# Patient Record
Sex: Female | Born: 1985 | Race: White | Hispanic: No | Smoking: Former smoker
Health system: Southern US, Community
[De-identification: ages and names within clinical notes are randomized; demographics above are authoritative.]

## PROBLEM LIST (undated history)

## (undated) DIAGNOSIS — F419 Anxiety disorder, unspecified: Secondary | ICD-10-CM

## (undated) DIAGNOSIS — E119 Type 2 diabetes mellitus without complications: Secondary | ICD-10-CM

## (undated) DIAGNOSIS — K219 Gastro-esophageal reflux disease without esophagitis: Secondary | ICD-10-CM

## (undated) DIAGNOSIS — I509 Heart failure, unspecified: Secondary | ICD-10-CM

## (undated) DIAGNOSIS — E785 Hyperlipidemia, unspecified: Secondary | ICD-10-CM

## (undated) DIAGNOSIS — I272 Pulmonary hypertension, unspecified: Secondary | ICD-10-CM

## (undated) DIAGNOSIS — M419 Scoliosis, unspecified: Secondary | ICD-10-CM

## (undated) DIAGNOSIS — Q213 Tetralogy of Fallot: Secondary | ICD-10-CM

## (undated) DIAGNOSIS — M549 Dorsalgia, unspecified: Secondary | ICD-10-CM

## (undated) HISTORY — DX: Tetralogy of Fallot: Q21.3

## (undated) HISTORY — DX: Type 2 diabetes mellitus without complications: E11.9

## (undated) HISTORY — PX: BACK SURGERY: SHX140

## (undated) HISTORY — DX: Hyperlipidemia, unspecified: E78.5

## (undated) HISTORY — PX: TETRALOGY OF FALLOT REPAIR: SHX796

## (undated) HISTORY — PX: BLALOCK PROCEDURE: SHX1242

---
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
## 2003-07-15 ENCOUNTER — Encounter: Payer: Self-pay | Admitting: *Deleted

## 2003-07-15 ENCOUNTER — Ambulatory Visit (HOSPITAL_COMMUNITY): Admission: RE | Admit: 2003-07-15 | Discharge: 2003-07-15 | Payer: Self-pay | Admitting: *Deleted

## 2003-07-15 ENCOUNTER — Encounter: Admission: RE | Admit: 2003-07-15 | Discharge: 2003-07-15 | Payer: Self-pay | Admitting: *Deleted

## 2009-12-13 ENCOUNTER — Ambulatory Visit: Payer: Self-pay | Admitting: Family Medicine

## 2010-07-20 ENCOUNTER — Emergency Department (HOSPITAL_COMMUNITY): Admission: EM | Admit: 2010-07-20 | Discharge: 2010-07-20 | Payer: Self-pay | Admitting: Emergency Medicine

## 2010-07-20 ENCOUNTER — Other Ambulatory Visit: Payer: Self-pay | Admitting: Obstetrics & Gynecology

## 2010-07-23 ENCOUNTER — Ambulatory Visit: Payer: Self-pay | Admitting: Nurse Practitioner

## 2010-07-23 ENCOUNTER — Observation Stay (HOSPITAL_COMMUNITY): Admission: EM | Admit: 2010-07-23 | Discharge: 2010-07-24 | Payer: Self-pay | Admitting: Emergency Medicine

## 2010-07-24 ENCOUNTER — Encounter: Payer: Self-pay | Admitting: Internal Medicine

## 2010-08-01 ENCOUNTER — Ambulatory Visit: Payer: Self-pay | Admitting: Internal Medicine

## 2010-08-01 DIAGNOSIS — J45909 Unspecified asthma, uncomplicated: Secondary | ICD-10-CM | POA: Insufficient documentation

## 2010-08-01 DIAGNOSIS — M4120 Other idiopathic scoliosis, site unspecified: Secondary | ICD-10-CM | POA: Insufficient documentation

## 2010-08-01 DIAGNOSIS — R011 Cardiac murmur, unspecified: Secondary | ICD-10-CM

## 2010-08-01 DIAGNOSIS — F411 Generalized anxiety disorder: Secondary | ICD-10-CM | POA: Insufficient documentation

## 2010-08-01 DIAGNOSIS — K219 Gastro-esophageal reflux disease without esophagitis: Secondary | ICD-10-CM

## 2010-08-01 DIAGNOSIS — Z8774 Personal history of (corrected) congenital malformations of heart and circulatory system: Secondary | ICD-10-CM | POA: Insufficient documentation

## 2010-08-01 DIAGNOSIS — J309 Allergic rhinitis, unspecified: Secondary | ICD-10-CM | POA: Insufficient documentation

## 2010-08-23 ENCOUNTER — Ambulatory Visit: Payer: Self-pay | Admitting: Internal Medicine

## 2010-08-23 ENCOUNTER — Other Ambulatory Visit: Admission: RE | Admit: 2010-08-23 | Discharge: 2010-08-23 | Payer: Self-pay | Admitting: Internal Medicine

## 2010-08-23 LAB — CONVERTED CEMR LAB: Pap Smear: NEGATIVE

## 2010-09-02 ENCOUNTER — Encounter: Payer: Self-pay | Admitting: Internal Medicine

## 2010-11-22 ENCOUNTER — Telehealth: Payer: Self-pay | Admitting: Internal Medicine

## 2011-01-24 NOTE — Progress Notes (Signed)
Summary: RF-xanax  Phone Note Refill Request Message from:  Fax from Pharmacy on November 22, 2010 8:21 AM  Refills Requested: Medication #1:  XANAX 0.25 MG TABS 1 to 2 once daily as needed   Last Refilled: 10/24/2010 Last office visit 08/23/2010 please Advise refills  Initial call taken by: Ami Bullins CMA,  November 22, 2010 8:22 AM  Follow-up for Phone Call        ok x 3 will need f/u appt next 3 months before add'l renewals Follow-up by: Jacques Navy MD,  November 22, 2010 9:14 AM    Prescriptions: Prudy Feeler 0.25 MG TABS (ALPRAZOLAM) 1 to 2 once daily as needed  #60 x 2   Entered by:   Lamar Sprinkles, CMA   Authorized by:   Jacques Navy MD   Signed by:   Lamar Sprinkles, CMA on 11/22/2010   Method used:   Telephoned to ...       [facility]       [address]       [place], [place]  [address]       Ph: [phone]       Fax: [phone]   RxID:   [id number]

## 2011-01-24 NOTE — Assessment & Plan Note (Signed)
Summary: new / self pay, advised to bring $184, will pay what she can/...   Vital Signs:  Patient profile:   25 year old female Height:      60.5 inches Weight:      153 pounds BMI:     29.50 O2 Sat:      90 % on Room air Temp:     98.1 degrees F oral Pulse rate:   76 / minute BP sitting:   132 / 60  (left arm) Cuff size:   regular  Vitals Entered By: Bill Salinas CMA (August 01, 2010 2:41 PM)  O2 Flow:  Room air  Primary Care Kalina Morabito:  michael e. norins   History of Present Illness: Patient seen for hospital follow-up: She had one ED visit for anxiety. She then had an episode of Chest pain and had 24 hr observation - ruled out for MI and was told this was a panic attach. Reviewed hospital records: neg CT Angio, normal EKG, labs were normal including cardiac enzymes. she has done well taking the xanax on a as needed basis and this has treated her chest pain.   She is also here to establish for primary care.   Preventive Screening-Counseling & Management  Safety-Violence-Falls     Seat Belt Use: yes     Helmet Use: yes     Violence in the Home: no risk noted  Current Medications (verified): 1)  Xanax 0.25 Mg Tabs (Alprazolam) .Marland Kitchen.. 1 To 2 Once Daily As Needed 2)  Multiple Vitamin  Tabs (Multiple Vitamin) .Marland Kitchen.. 1 Tab Once Daily  Allergies (verified): No Known Drug Allergies  Past History:  Past Medical History: ALLERGIC RHINITIS CAUSE UNSPECIFIED (ICD-477.9) SCOLIOSIS, THORACOLUMBAR (ICD-737.30) TETRALOGY OF FALLOT, HX OF (ICD-V12.59) HEART MURMUR (ICD-785.2) GERD (ICD-530.81) Hx of ASTHMA (ICD-493.90)  Past Surgical History: Heart  shunt operation @ 3months old Tetraology of the Fallot at 26 years old (53) Heart Cath at 15 years Scoliosis surg (steel rods placed) 1997  Family History: Father - 1960: EtOH abuse, smoker Mother - 1955: DM, HTN, Uterine cancer, colon polyps M Aunt with Breast Cancer, Lung Cancer Neg - colon cancer; CAD/MI Family History of  Alcoholism/Addiction- parents Family History of Arthritis- grandparents Family History High cholesterol- parents/ grandparents  Social History: HSG, community college - a little bit Single, in a 4 year relationship no children Unemployed No histor of abuse. Seat Belt Use:  yes  Review of Systems       The patient complains of chest pain.  The patient denies anorexia, fever, weight loss, weight gain, vision loss, decreased hearing, hoarseness, syncope, dyspnea on exertion, peripheral edema, headaches, abdominal pain, severe indigestion/heartburn, incontinence, difficulty walking, unusual weight change, enlarged lymph nodes, and breast masses.    Physical Exam  General:  WNWD white female in no distress Head:  normocephalic and atraumatic.   Eyes:  vision grossly intact, pupils equal, pupils round, pupils reactive to light, corneas and lenses clear, no injection, no iris abnormalities, no optic disk abnormalities, and no retinal abnormalitiies.   Ears:  cerumen impaction right.  Nose:  no external deformity and no external erythema.   Mouth:  good dentition, no gingival abnormalities, and no dental plaque.   Neck:  supple, full ROM, no masses, no thyromegaly, and no carotid bruits.   Chest Wall:  scociolosis thoracolumbar with prominent right scapula. No CVAT Breasts:  deferred Lungs:  normal respiratory effort, normal breath sounds, no crackles, and no wheezes.   Heart:  normal  rate and regular rhythm.  complex murmurs: RSB harsh II/VI systolic with I/VI diastolic; LSB with III/VI harsh rumbling systolic murmur, II/VI diastolic murmur. No murmur at apex or left axillary Abdomen:  soft, non-tender, normal bowel sounds, no guarding, no rigidity, and no hepatomegaly.   Genitalia:  deferred Msk:  normal ROM, no joint tenderness, no joint swelling, and no joint warmth.   Pulses:  2+ radial and DP Extremities:  No clubbing, cyanosis, edema, or deformity noted with normal full range of motion  of all joints.   Neurologic:  alert & oriented X3, cranial nerves II-XII intact, strength normal in all extremities, gait normal, and DTRs symmetrical and normal.   Skin:  turgor normal, color normal, no suspicious lesions, and no ulcerations.   Cervical Nodes:  no anterior cervical adenopathy and no posterior cervical adenopathy.   Psych:  Oriented X3, memory intact for recent and remote, normally interactive, good eye contact, and moderately anxious.     Impression & Recommendations:  Problem # 1:  SCOLIOSIS, THORACOLUMBAR (ICD-737.30) Patient with chronic back pain due to scoliosis.  Plan - will best benefit from Physical Therapy consult for posture and exercise program - patient directed to [facility]  Problem # 2:  ASTHMA (ICD-493.90) Stable asthma with no wheezing or SOB on todays exam.   Problem # 3:  ANXIETY (ICD-300.00) Patient with chronic anxiety but has recently had what has been diagnosed as a panic attack with recent hospitalization for chest pain. she was provided Xanax at discharge. On questioning she admits to chronic low level anxiety.  Plan - fluoxetine 20mg  daily for management of chronic anxiety           Xanax to be used as needed for breakthrough anxiety.  Her updated medication list for this problem includes:    Xanax 0.25 Mg Tabs (Alprazolam) .Marland Kitchen... 1 to 2 once daily as needed    Fluoxetine Hcl 20 Mg Caps (Fluoxetine hcl) .Marland Kitchen... 1 by mouth qam for anxiety    Alprazolam 0.5 Mg Tabs (Alprazolam) .Marland Kitchen... 1 by mouth q4 as needed anxiety attack  Problem # 4:  Preventive Health Care (ICD-V70.0) Patient with no active problems except as above. She has never had a PAP smear. She did have recent labs while in hospital: see data scanned to chart: lipids normal, CBC, Bmet normal, A1C normal. Patient does have murmur of tetralogy repair but has not had a 2D echo for a long time - will consider this at a later date.  Patient is referred to [facility]  In summary - a very nice young woman who is established for continuity care. She will return soon for well woman exam.   Complete Medication List: 1)  Xanax 0.25 Mg Tabs (Alprazolam) .Marland Kitchen.. 1 to 2 once daily as needed 2)  Multiple Vitamin Tabs (Multiple vitamin) .Marland Kitchen.. 1 tab once daily 3)  Fluoxetine Hcl 20 Mg Caps (Fluoxetine hcl) .Marland Kitchen.. 1 by mouth qam for anxiety 4)  Alprazolam 0.5 Mg Tabs (Alprazolam) .Marland Kitchen.. 1 by mouth q4 as needed anxiety attack Prescriptions: ALPRAZOLAM 0.5 MG TABS (ALPRAZOLAM) 1 by mouth q4 as needed anxiety attack  #60 x 3   Entered and Authorized by:   Jacques Navy MD   Signed by:   Jacques Navy MD on 08/01/2010   Method used:   Print then Give to Patient   RxID:   [id number] FLUOXETINE HCL 20 MG CAPS (FLUOXETINE HCL) 1 by mouth qAM for anxiety  #30  x 12   Entered and Authorized by:   Jacques Navy MD   Signed by:   Jacques Navy MD on 08/01/2010   Method used:   Print then Give to Patient   RxID:   [id number]

## 2011-01-24 NOTE — Letter (Signed)
   [facility] [address] [place], [place]  [address] Phone: [phone]      September 02, 2010   [facility] [address] [place], [place] [address]  RE:  LAB RESULTS  Dear  Ms. [facility],  The following is an interpretation of your most recent lab tests.  Please take note of any instructions provided or changes to medications that have resulted from your lab work.  Pap Smear: normal - follow up in 1 year  Please come see me if you have any questions about these lab results.   Sincerely Yours,    Jacques Navy MD    MRN: [id number]     Physician: Illene Regulus     DOB/Age [birth year] (Age: 25) Gender: F     Collected Date: 08/23/2010     Received Date: 08/24/2010          FINAL DIAGNOSIS     STATEMENT Of SPECIMEN ADEQUACY:          Satisfactory for evaluation  endocervical/transformation zone component PRESENT.               INTERPRETATION(S):          NEGATIVE FOR INTRAEPITHELIAL LESIONS OR MALIGNANCY. BENIGN REACTIVE/REPARATIVE CHANGES.          DATE SIGNED OUT: 08/30/2010     ELECTRONIC SIGNATURE : Rund D.O., [place], Pathologist, International aid/development worker

## 2011-01-24 NOTE — Assessment & Plan Note (Signed)
Summary: pap / breast exam / # /cd   Vital Signs:  Patient profile:   25 year old female Height:      60.5 inches Weight:      158 pounds BMI:     30.46 O2 Sat:      86 % on Room air Temp:     96.6 degrees F oral Pulse rate:   72 / minute BP sitting:   108 / 60  (left arm) Cuff size:   regular  Vitals Entered By: Bill Salinas CMA (August 23, 2010 3:35 PM)  O2 Flow:  Room air CC: pt here for pap and breat exam. Pt also has c/o cough x about 3 days getting worse at night/ ab   Primary Care Provider:  michael e. norins  CC:  pt here for pap and breat exam. Pt also has c/o cough x about 3 days getting worse at night/ ab.  History of Present Illness: Patinet recently seen as a new patient and started on treatment for anxiety with prozac and as needed alpraqzolam. She has done really well with this treatment.  She returns today for well woman exam.  Current Medications (verified): 1)  Xanax 0.25 Mg Tabs (Alprazolam) .Marland Kitchen.. 1 To 2 Once Daily As Needed 2)  Multiple Vitamin  Tabs (Multiple Vitamin) .Marland Kitchen.. 1 Tab Once Daily 3)  Fluoxetine Hcl 20 Mg Caps (Fluoxetine Hcl) .Marland Kitchen.. 1 By Mouth Qam For Anxiety 4)  Alprazolam 0.5 Mg Tabs (Alprazolam) .Marland Kitchen.. 1 By Mouth Q4 As Needed Anxiety Attack  Allergies (verified): No Known Drug Allergies PMH-FH-SH reviewed-no changes except otherwise noted  Review of Systems  The patient denies anorexia, fever, weight loss, weight gain, decreased hearing, hoarseness, chest pain, syncope, dyspnea on exertion, prolonged cough, severe indigestion/heartburn, muscle weakness, suspicious skin lesions, difficulty walking, abnormal bleeding, and enlarged lymph nodes.    Physical Exam  General:  Well-developed,well-nourished,in no acute distress; alert,appropriate and cooperative throughout examination Breasts:  No mass, nodules, thickening, tenderness, bulging, retraction, inflamation, nipple discharge or skin changes noted.   Genitalia:  Pelvic Exam:  External: normal female genitalia without lesions or masses        Vagina: normal without lesions or masses        Cervix: normal without lesions or masses        Adnexa: normal bimanual exam without masses or fullness        Uterus: normal by palpation        Pap smear: performed   Impression & Recommendations:  Problem # 1:  ANXIETY (ICD-300.00) Patient doing very on present medications.  Plan - ROV 6 months  Her updated medication list for this problem includes:    Xanax 0.25 Mg Tabs (Alprazolam) .Marland Kitchen... 1 to 2 once daily as needed    Fluoxetine Hcl 20 Mg Caps (Fluoxetine hcl) .Marland Kitchen... 1 by mouth qam for anxiety    Alprazolam 0.5 Mg Tabs (Alprazolam) .Marland Kitchen... 1 by mouth q4 as needed anxiety attack  Problem # 2:  Screening Cervical Cancer (ICD-V76.2) patient with a normal exam. Pap smear sent.   Complete Medication List: 1)  Xanax 0.25 Mg Tabs (Alprazolam) .Marland Kitchen.. 1 to 2 once daily as needed 2)  Multiple Vitamin Tabs (Multiple vitamin) .Marland Kitchen.. 1 tab once daily 3)  Fluoxetine Hcl 20 Mg Caps (Fluoxetine hcl) .Marland Kitchen.. 1 by mouth qam for anxiety 4)  Alprazolam 0.5 Mg Tabs (Alprazolam) .Marland Kitchen.. 1 by mouth q4 as needed anxiety attack

## 2011-02-28 ENCOUNTER — Ambulatory Visit (INDEPENDENT_AMBULATORY_CARE_PROVIDER_SITE_OTHER): Payer: Self-pay | Admitting: Internal Medicine

## 2011-02-28 ENCOUNTER — Encounter: Payer: Self-pay | Admitting: Internal Medicine

## 2011-02-28 DIAGNOSIS — M4120 Other idiopathic scoliosis, site unspecified: Secondary | ICD-10-CM

## 2011-02-28 DIAGNOSIS — F411 Generalized anxiety disorder: Secondary | ICD-10-CM

## 2011-03-07 NOTE — Assessment & Plan Note (Signed)
Summary: 6 MO FU /NWS  #   Vital Signs:  Patient profile:   25 year old female Height:      60.5 inches Weight:      154 pounds BMI:     29.69 O2 Sat:      96 % on Room air Temp:     97.5 degrees F oral Pulse rate:   82 / minute BP sitting:   138 / 82  (left arm) Cuff size:   regular  Vitals Entered By: Bill Salinas CMA (February 28, 2011 10:59 AM)  O2 Flow:  Room air CC: 6 month follow up, discuss medications/ ab   Primary Care Provider:  Valita Righter e. Nussen [name]  CC:  6 month follow up and discuss medications/ ab.  History of Present Illness: [name] presents for follow-up having been on fluoxetine and alprazolam for management of anxiety with panic. She has been doing well. She is intermittently taking very low doses of alprazolam. In addition she has been smoke free for one month!!!!!.  She continues to have back pain related to kyphoscoliosis and previous surgery. She had been referred to [facility] but had been turned away. I called [facility] and spoke with Ellan Lambert about getting patient enrolled: since she will be seen here, by me, they will enroll her. ([facility] is taking no new patients).  Current Medications (verified): 1)  Xanax 0.25 Mg Tabs (Alprazolam) .Marland Kitchen.. 1 To 2 Once Daily As Needed 2)  Multiple Vitamin  Tabs (Multiple Vitamin) .Marland Kitchen.. 1 Tab Once Daily 3)  Fluoxetine Hcl 20 Mg Caps (Fluoxetine Hcl) .Marland Kitchen.. 1 By Mouth Qam For Anxiety 4)  Alprazolam 0.5 Mg Tabs (Alprazolam) .Marland Kitchen.. 1 By Mouth Q4 As Needed Anxiety Attack  Allergies (verified): No Known Drug Allergies PMH-FH-SH reviewed-no changes except otherwise noted  Review of Systems  The patient denies anorexia, fever, weight loss, weight gain, chest pain, syncope, dyspnea on exertion, peripheral edema, headaches, hemoptysis, abdominal pain, muscle weakness, difficulty walking, enlarged lymph nodes, and angioedema.    Physical Exam  General:  alert, well-developed, well-nourished, and well-hydrated.   Head:   normocephalic and atraumatic.   Eyes:  C&S clear Lungs:  normal respiratory effort.   Heart:  normal rate and regular rhythm.   Psych:  Oriented X3, normally interactive, good eye contact, and not anxious appearing.     Impression & Recommendations:  Problem # 1:  ANXIETY (ICD-300.00) Doing very well on her current regimen. Refills provided   Her updated medication list for this problem includes:    Xanax 0.25 Mg Tabs (Alprazolam) .Marland Kitchen... 1 to 2 once daily as needed    Fluoxetine Hcl 20 Mg Caps (Fluoxetine hcl) .Marland Kitchen... 1 by mouth qam for anxiety    Alprazolam 0.5 Mg Tabs (Alprazolam) .Marland Kitchen... 1 by mouth q4 as needed anxiety attack  Problem # 2:  SCOLIOSIS, THORACOLUMBAR (ICD-737.30) Patient with continued back pain. she will enroll in [facility] and then [facility].   Complete Medication List: 1)  Xanax 0.25 Mg Tabs (Alprazolam) .Marland Kitchen.. 1 to 2 once daily as needed 2)  Multiple Vitamin Tabs (Multiple vitamin) .Marland Kitchen.. 1 tab once daily 3)  Fluoxetine Hcl 20 Mg Caps (Fluoxetine hcl) .Marland Kitchen.. 1 by mouth qam for anxiety 4)  Alprazolam 0.5 Mg Tabs (Alprazolam) .Marland Kitchen.. 1 by mouth q4 as needed anxiety attack Prescriptions: XANAX 0.25 MG TABS (ALPRAZOLAM) 1 to 2 once daily as needed  #60 x 5   Entered and Authorized by:  Jacques Navy MD   Signed by:   Jacques Navy MD on 02/28/2011   Method used:   Print then Give to Patient   RxID:   [id number] FLUOXETINE HCL 20 MG CAPS (FLUOXETINE HCL) 1 by mouth qAM for anxiety  #30 x 12   Entered and Authorized by:   Jacques Navy MD   Signed by:   Jacques Navy MD on 02/28/2011   Method used:   Electronically to        [facility])       [address]       [place], [place]  [address]       Ph: [phone]       Fax: [phone]   RxID:   [id number]    Orders Added: 1)  Est. Patient Level II [[address]]

## 2011-03-11 LAB — CBC
HCT: 54.2 % — ABNORMAL HIGH (ref 36.0–46.0)
HCT: 54.5 % — ABNORMAL HIGH (ref 36.0–46.0)
Hemoglobin: 17 g/dL — ABNORMAL HIGH (ref 12.0–15.0)
MCH: 31.3 pg (ref 26.0–34.0)
MCH: 31.7 pg (ref 26.0–34.0)
MCHC: 33.6 g/dL (ref 30.0–36.0)
MCHC: 34.3 g/dL (ref 30.0–36.0)
MCV: 91.7 fL (ref 78.0–100.0)
MCV: 92.3 fL (ref 78.0–100.0)
Platelets: 222 10*3/uL (ref 150–400)
RBC: 5.94 MIL/uL — ABNORMAL HIGH (ref 3.87–5.11)
RDW: 14.4 % (ref 11.5–15.5)
RDW: 14.4 % (ref 11.5–15.5)

## 2011-03-11 LAB — POCT I-STAT, CHEM 8
BUN: 12 mg/dL (ref 6–23)
Calcium, Ion: 1.17 mmol/L (ref 1.12–1.32)
Creatinine, Ser: 0.8 mg/dL (ref 0.4–1.2)
Glucose, Bld: 105 mg/dL — ABNORMAL HIGH (ref 70–99)
Hemoglobin: 20.1 g/dL — ABNORMAL HIGH (ref 12.0–15.0)
Sodium: 141 mEq/L (ref 135–145)
TCO2: 24 mmol/L (ref 0–100)

## 2011-03-11 LAB — SEDIMENTATION RATE: Sed Rate: 3 mm/hr (ref 0–22)

## 2011-03-11 LAB — LIPID PANEL
Cholesterol: 160 mg/dL (ref 0–200)
LDL Cholesterol: 105 mg/dL — ABNORMAL HIGH (ref 0–99)

## 2011-03-11 LAB — URINALYSIS, ROUTINE W REFLEX MICROSCOPIC
Ketones, ur: 15 mg/dL — AB
Leukocytes, UA: NEGATIVE
Nitrite: NEGATIVE
Specific Gravity, Urine: 1.015 (ref 1.005–1.030)
Urobilinogen, UA: 1 mg/dL (ref 0.0–1.0)
pH: 6.5 (ref 5.0–8.0)

## 2011-03-11 LAB — DIFFERENTIAL
Basophils Absolute: 0.1 10*3/uL (ref 0.0–0.1)
Basophils Relative: 0 % (ref 0–1)
Eosinophils Absolute: 0.1 10*3/uL (ref 0.0–0.7)
Eosinophils Absolute: 0.2 10*3/uL (ref 0.0–0.7)
Eosinophils Relative: 0 % (ref 0–5)
Eosinophils Relative: 1 % (ref 0–5)
Lymphocytes Relative: 14 % (ref 12–46)
Lymphs Abs: 1.8 10*3/uL (ref 0.7–4.0)
Monocytes Absolute: 0.7 10*3/uL (ref 0.1–1.0)
Monocytes Absolute: 1.1 10*3/uL — ABNORMAL HIGH (ref 0.1–1.0)
Monocytes Relative: 5 % (ref 3–12)

## 2011-03-11 LAB — POCT CARDIAC MARKERS
Troponin i, poc: <0.05 ng/mL (ref 0.00–0.09)
Troponin i, poc: <0.05 ng/mL (ref 0.00–0.09)

## 2011-03-11 LAB — BASIC METABOLIC PANEL
BUN: 16 mg/dL (ref 6–23)
BUN: 19 mg/dL (ref 6–23)
Calcium: 9.2 mg/dL (ref 8.4–10.5)
Chloride: 103 mEq/L (ref 96–112)
Creatinine, Ser: 0.86 mg/dL (ref 0.4–1.2)
GFR calc non Af Amer: >60 mL/min (ref >60)
Glucose, Bld: 109 mg/dL — ABNORMAL HIGH (ref 70–99)
Glucose, Bld: 80 mg/dL (ref 70–99)
Potassium: 4.6 mEq/L (ref 3.5–5.1)
Sodium: 139 mEq/L (ref 135–145)

## 2011-03-11 LAB — CARDIAC PANEL(CRET KIN+CKTOT+MB+TROPI)
CK, MB: 1 ng/mL (ref 0.3–4.0)
CK, MB: 1 ng/mL (ref 0.3–4.0)
Troponin I: 0.01 ng/mL (ref 0.00–0.06)

## 2011-03-11 LAB — URINE CULTURE
Colony Count: NO GROWTH
Culture: NO GROWTH

## 2011-03-11 LAB — CK TOTAL AND CKMB (NOT AT ARMC)
CK, MB: 0.8 ng/mL (ref 0.3–4.0)
Relative Index: INVALID (ref 0.0–2.5)
Total CK: 54 U/L (ref 7–177)

## 2011-03-11 LAB — URINE MICROSCOPIC-ADD ON

## 2011-03-11 LAB — HEMOGLOBIN A1C: Hgb A1c MFr Bld: 5.7 % — ABNORMAL HIGH (ref <5.7)

## 2011-09-23 ENCOUNTER — Other Ambulatory Visit: Payer: Self-pay | Admitting: Internal Medicine

## 2011-09-26 NOTE — Telephone Encounter (Signed)
Please advise 

## 2011-09-27 NOTE — Telephone Encounter (Signed)
Ok for refill x 5 

## 2011-10-31 ENCOUNTER — Emergency Department (HOSPITAL_COMMUNITY): Payer: Medicaid Other

## 2011-10-31 ENCOUNTER — Inpatient Hospital Stay (HOSPITAL_COMMUNITY)
Admission: EM | Admit: 2011-10-31 | Discharge: 2011-11-10 | DRG: 193 | Disposition: A | Discharge status: Home / Self Care | Payer: Medicaid Other | Attending: Internal Medicine | Admitting: Internal Medicine

## 2011-10-31 ENCOUNTER — Encounter: Payer: Self-pay | Admitting: *Deleted

## 2011-10-31 DIAGNOSIS — Z9981 Dependence on supplemental oxygen: Secondary | ICD-10-CM

## 2011-10-31 DIAGNOSIS — I2789 Other specified pulmonary heart diseases: Secondary | ICD-10-CM | POA: Diagnosis present

## 2011-10-31 DIAGNOSIS — J189 Pneumonia, unspecified organism: Principal | ICD-10-CM | POA: Diagnosis present

## 2011-10-31 DIAGNOSIS — R0902 Hypoxemia: Secondary | ICD-10-CM

## 2011-10-31 DIAGNOSIS — F411 Generalized anxiety disorder: Secondary | ICD-10-CM | POA: Diagnosis present

## 2011-10-31 DIAGNOSIS — Z23 Encounter for immunization: Secondary | ICD-10-CM

## 2011-10-31 DIAGNOSIS — J45909 Unspecified asthma, uncomplicated: Secondary | ICD-10-CM | POA: Diagnosis present

## 2011-10-31 DIAGNOSIS — I517 Cardiomegaly: Secondary | ICD-10-CM | POA: Diagnosis present

## 2011-10-31 DIAGNOSIS — Z79899 Other long term (current) drug therapy: Secondary | ICD-10-CM

## 2011-10-31 DIAGNOSIS — Q213 Tetralogy of Fallot: Secondary | ICD-10-CM

## 2011-10-31 DIAGNOSIS — Z8774 Personal history of (corrected) congenital malformations of heart and circulatory system: Secondary | ICD-10-CM | POA: Insufficient documentation

## 2011-10-31 DIAGNOSIS — K219 Gastro-esophageal reflux disease without esophagitis: Secondary | ICD-10-CM | POA: Diagnosis present

## 2011-10-31 DIAGNOSIS — I079 Rheumatic tricuspid valve disease, unspecified: Secondary | ICD-10-CM | POA: Diagnosis present

## 2011-10-31 DIAGNOSIS — Z87891 Personal history of nicotine dependence: Secondary | ICD-10-CM

## 2011-10-31 DIAGNOSIS — R042 Hemoptysis: Secondary | ICD-10-CM | POA: Insufficient documentation

## 2011-10-31 DIAGNOSIS — I10 Essential (primary) hypertension: Secondary | ICD-10-CM | POA: Diagnosis present

## 2011-10-31 DIAGNOSIS — Z8679 Personal history of other diseases of the circulatory system: Secondary | ICD-10-CM

## 2011-10-31 DIAGNOSIS — I509 Heart failure, unspecified: Secondary | ICD-10-CM | POA: Diagnosis present

## 2011-10-31 HISTORY — DX: Dorsalgia, unspecified: M54.9

## 2011-10-31 HISTORY — DX: Gastro-esophageal reflux disease without esophagitis: K21.9

## 2011-10-31 HISTORY — DX: Anxiety disorder, unspecified: F41.9

## 2011-10-31 HISTORY — DX: Scoliosis, unspecified: M41.9

## 2011-10-31 LAB — DIFFERENTIAL
Basophils Relative: 1 % (ref 0–1)
Eosinophils Absolute: 0 10*3/uL (ref 0.0–0.7)
Monocytes Absolute: 0.6 10*3/uL (ref 0.1–1.0)
Monocytes Relative: 8 % (ref 3–12)

## 2011-10-31 LAB — CBC
HCT: 51.8 % — ABNORMAL HIGH (ref 36.0–46.0)
Hemoglobin: 17.7 g/dL — ABNORMAL HIGH (ref 12.0–15.0)
MCH: 31.2 pg (ref 26.0–34.0)
MCHC: 34.2 g/dL (ref 30.0–36.0)

## 2011-10-31 LAB — D-DIMER, QUANTITATIVE: D-Dimer, Quant: 0.42 ug/mL-FEU (ref 0.00–0.48)

## 2011-10-31 MED ORDER — DEXTROSE 5 % IV SOLN
1.0000 g | Freq: Once | INTRAVENOUS | Status: AC
  Administered 2011-11-01: 1 g via INTRAVENOUS

## 2011-10-31 MED ORDER — HYDROCODONE-ACETAMINOPHEN 5-325 MG PO TABS
1.0000 | ORAL_TABLET | Freq: Once | ORAL | Status: AC
  Administered 2011-10-31: 1 via ORAL
  Filled 2011-10-31: qty 1

## 2011-10-31 MED ORDER — DEXTROSE 5 % IV SOLN
500.0000 mg | Freq: Once | INTRAVENOUS | Status: AC
  Administered 2011-11-01: 500 mg via INTRAVENOUS

## 2011-10-31 MED ORDER — SODIUM CHLORIDE 0.9 % IV BOLUS (SEPSIS)
500.0000 mL | Freq: Once | INTRAVENOUS | Status: AC
  Administered 2011-11-01: 500 mL via INTRAVENOUS

## 2011-10-31 NOTE — ED Notes (Signed)
Pt states she has been coughing up blood. States started yesterday

## 2011-10-31 NOTE — ED Provider Notes (Signed)
History     CSN: [id number] Arrival date & time: 10/31/2011  6:36 PM   First MD Initiated Contact with Patient 10/31/11 2149      Chief Complaint  Patient presents with  . URI    (Consider location/radiation/quality/duration/timing/severity/associated sxs/prior treatment) HPI Comments: Patient presents to the emergency department with a complaint of hemoptysis and cold-like symptoms for the last 2-3 days. The patient states that she has had this cough for a week but originally she was only coughing up mucus. Only recently did she get "up blood. She denies chest pain, shortness of breath, difficulty breathing, apnea, congestion, sinus pressure, and headache. The patient also complains of a sharp pain in her left rib however this pain is not present now and is not pleuritic in nature. The patient denies estrogen use, a history of blood clots, recent travel, surgery or recent immobilization. Of note the patient did smoke cigarettes however she quit in February. Her history is significant for stalked tetralogy of fellot, asthma, and scoliosis with surgery.  Patient is a 25 y.o. female presenting with URI. The history is provided by the patient and a relative.  URI The primary symptoms include cough. Primary symptoms do not include fever, fatigue, headaches, ear pain, sore throat, wheezing, abdominal pain, nausea, vomiting or myalgias.  The illness is not associated with chills, sinus pressure, congestion or rhinorrhea.    Past Medical History  Diagnosis Date  . Back pain     History reviewed. No pertinent past surgical history.  No family history on file.  History  Substance Use Topics  . Smoking status: Current Everyday Smoker  . Smokeless tobacco: Not on file  . Alcohol Use: Yes    OB History    Grav Para Term Preterm Abortions TAB SAB Ect Mult Living                  Review of Systems  Constitutional: Negative for fever, chills, appetite change and fatigue.  HENT:  Negative for hearing loss, ear pain, nosebleeds, congestion, sore throat, rhinorrhea, sneezing, trouble swallowing, neck stiffness, voice change, postnasal drip, sinus pressure, tinnitus and ear discharge.   Eyes: Negative for photophobia and visual disturbance.  Respiratory: Positive for cough. Negative for apnea, choking, chest tightness, shortness of breath, wheezing and stridor.   Cardiovascular: Negative for chest pain, palpitations and leg swelling.  Gastrointestinal: Negative for nausea, vomiting, abdominal pain, diarrhea and constipation.  Genitourinary: Negative for dysuria, urgency and flank pain.  Musculoskeletal: Negative for myalgias.  Neurological: Negative for dizziness, seizures, syncope, weakness, light-headedness, numbness and headaches.  Psychiatric/Behavioral: Negative for behavioral problems and confusion.  All other systems reviewed and are negative.    Allergies  Review of patient's allergies indicates no known allergies.  Home Medications   Current Outpatient Rx  Name Route Sig Dispense Refill  . ALPRAZOLAM 0.25 MG PO TABS  TAKE ONE TO TWO TABLETS BY MOUTH EVERY DAY AS NEEDED 60 tablet 5  . FLUOXETINE HCL 20 MG PO CAPS Oral Take 20 mg by mouth daily.      . GUAIFENESIN 100 MG/5ML PO SOLN Oral Take 10 mLs by mouth 2 (two) times daily as needed. For cough       BP 144/58  Pulse 98  Temp(Src) 99.6 F (37.6 C) (Oral)  Resp 18  SpO2 82%  LMP 10/25/2011  Physical Exam  Nursing note and vitals reviewed. Constitutional: She is oriented to person, place, and time. She appears well-developed and well-nourished. No distress.  HENT:  Head: No trismus in the jaw.  Right Ear: Tympanic membrane, external ear and ear canal normal.  Left Ear: Tympanic membrane, external ear and ear canal normal.  Mouth/Throat: Uvula is midline and mucous membranes are normal. No oropharyngeal exudate, posterior oropharyngeal edema or posterior oropharyngeal erythema.  Eyes:        Normal appearance  Neck: Normal range of motion. Neck supple.  Cardiovascular: Exam reveals gallop, S3 and S4.        Patient has an irregular heartbeat. Unable to decipher between S1, S2, and S3, S4. This is due to her heart surgery as a child from tetralogy of fallot   Pulmonary/Chest: Effort normal and breath sounds normal. No accessory muscle usage. No apnea and not tachypneic. No respiratory distress. She has no decreased breath sounds. She has no wheezes. She has no rhonchi. She has no rales.  Lymphadenopathy:    She has no cervical adenopathy.  Neurological: She is alert and oriented to person, place, and time.  Skin: Skin is warm and dry. No rash noted.  Psychiatric: She has a normal mood and affect. Her behavior is normal.    ED Course  Procedures (including critical care time)  Labs Reviewed  CBC - Abnormal; Notable for the following:    RBC 5.68 (*)    Hemoglobin 17.7 (*)    HCT 51.8 (*)    RDW 16.1 (*)    All other components within normal limits  DIFFERENTIAL - Abnormal; Notable for the following:    Neutrophils Relative 83 (*)    Lymphocytes Relative 9 (*)    All other components within normal limits  COMPREHENSIVE METABOLIC PANEL  D-DIMER, QUANTITATIVE   Dg Chest 2 View  10/31/2011  *RADIOLOGY REPORT*  Clinical Data: Shortness of breath, cough.  CHEST - 2 VIEW  Comparison: 07/23/2010  Findings: Cardiomegaly.  Central vascular congestion.  Mild patchy opacity in the right lower lobe. No pleural effusion or pneumothorax.  No acute osseous abnormality.  Curvature of the thoracic spine, with associated thoracic rod hardware is without interval change.  IMPRESSION: Cardiomegaly with central vascular congestion.  Patchy right lower lung opacity may reflect asymmetric edema or infection.  Original Report Authenticated By: Waneta Martins, M.D.   Results for orders placed during the hospital encounter of 10/31/11  CBC      Component Value Range   WBC 8.3  4.0 - 10.5  (K/uL)   RBC 5.68 (*) 3.87 - 5.11 (MIL/uL)   Hemoglobin 17.7 (*) 12.0 - 15.0 (g/dL)   HCT 78.2 (*) 95.6 - 46.0 (%)   MCV 91.2  78.0 - 100.0 (fL)   MCH 31.2  26.0 - 34.0 (pg)   MCHC 34.2  30.0 - 36.0 (g/dL)   RDW 21.3 (*) 08.6 - 15.5 (%)   Platelets 181  150 - 400 (K/uL)  DIFFERENTIAL      Component Value Range   Neutrophils Relative 83 (*) 43 - 77 (%)   Neutro Abs 6.8  1.7 - 7.7 (K/uL)   Lymphocytes Relative 9 (*) 12 - 46 (%)   Lymphs Abs 0.8  0.7 - 4.0 (K/uL)   Monocytes Relative 8  3 - 12 (%)   Monocytes Absolute 0.6  0.1 - 1.0 (K/uL)   Eosinophils Relative 0  0 - 5 (%)   Eosinophils Absolute 0.0  0.0 - 0.7 (K/uL)   Basophils Relative 1  0 - 1 (%)   Basophils Absolute 0.0  0.0 - 0.1 (K/uL)  COMPREHENSIVE METABOLIC PANEL  Component Value Range   Sodium 132 (*) 135 - 145 (mEq/L)   Potassium PENDING  3.5 - 5.1 (mEq/L)   Chloride 94 (*) 96 - 112 (mEq/L)   CO2 18 (*) 19 - 32 (mEq/L)   Glucose, Bld 101 (*) 70 - 99 (mg/dL)   BUN 15  6 - 23 (mg/dL)   Creatinine, Ser 4.09  0.50 - 1.10 (mg/dL)   Calcium 9.6  8.4 - 81.1 (mg/dL)   Total Protein 8.0  6.0 - 8.3 (g/dL)   Albumin 4.1  3.5 - 5.2 (g/dL)   AST PENDING  0 - 37 (U/L)   ALT PENDING  0 - 35 (U/L)   Alkaline Phosphatase PENDING  39 - 117 (U/L)   Total Bilirubin 1.6 (*) 0.3 - 1.2 (mg/dL)   GFR calc non Af Amer >90  >90 (mL/min)   GFR calc Af Amer >90  >90 (mL/min)  D-DIMER, QUANTITATIVE      Component Value Range   D-Dimer, Quant 0.42  0.00 - 0.48 (ug/mL-FEU)      Patient's O2 sats in triage were 82. Be rechecked them once transferred to CDU. Here her breathing was around 89. She is on a continuous pulse ox monitor and she's been placed on 2 L nasal cannula. Patient states her oxygen normally runs in the high and the low 90s. However she did not know the reason. Her primary care doctor says it has something to do with her heart condition and not perhaps "one of her lungs doesn't work properly"  11:59 PM  Possible lung  infection seen on chest x-ray. Patient started on ceftriaxone and azithromycin IV for treatment. Patients PCP is Dr. Illene Regulus from [facility]. [facility] has been paged to admit patient  12:35 AM Spoke with Dr. Mikeal Hawthorne from [facility] who will admit the pt for Dr. Arthur Holms to Team 7, med surg bed   MDM  Pneumonia Hypoxia  Hypertension            [place], [place] 11/01/11 0036

## 2011-10-31 NOTE — ED Notes (Signed)
She has had a cold and sh has been coughing up  Blood for 3 days.  No temp

## 2011-11-01 ENCOUNTER — Inpatient Hospital Stay (HOSPITAL_COMMUNITY): Payer: Medicaid Other

## 2011-11-01 ENCOUNTER — Encounter (HOSPITAL_COMMUNITY): Payer: Self-pay | Admitting: Internal Medicine

## 2011-11-01 DIAGNOSIS — J159 Unspecified bacterial pneumonia: Secondary | ICD-10-CM

## 2011-11-01 DIAGNOSIS — R042 Hemoptysis: Secondary | ICD-10-CM | POA: Insufficient documentation

## 2011-11-01 DIAGNOSIS — J189 Pneumonia, unspecified organism: Secondary | ICD-10-CM | POA: Insufficient documentation

## 2011-11-01 LAB — COMPREHENSIVE METABOLIC PANEL
Albumin: 4.1 g/dL (ref 3.5–5.2)
BUN: 15 mg/dL (ref 6–23)
Chloride: 94 mEq/L — ABNORMAL LOW (ref 96–112)
Creatinine, Ser: 0.61 mg/dL (ref 0.50–1.10)
GFR calc Af Amer: >90 mL/min (ref >90)
Total Bilirubin: 1.6 mg/dL — ABNORMAL HIGH (ref 0.3–1.2)
Total Protein: 8 g/dL (ref 6.0–8.3)

## 2011-11-01 MED ORDER — DEXTROSE 5 % IV SOLN
INTRAVENOUS | Status: AC
  Filled 2011-11-01: qty 250

## 2011-11-01 MED ORDER — FLUOXETINE HCL 20 MG PO CAPS
20.0000 mg | ORAL_CAPSULE | Freq: Every day | ORAL | Status: DC
  Administered 2011-11-01 – 2011-11-10 (×10): 20 mg via ORAL
  Filled 2011-11-01 (×11): qty 1

## 2011-11-01 MED ORDER — DEXTROSE 5 % IV SOLN
500.0000 mg | INTRAVENOUS | Status: DC
  Administered 2011-11-02: 500 mg via INTRAVENOUS
  Filled 2011-11-01: qty 500

## 2011-11-01 MED ORDER — DEXTROSE 5 % IV SOLN
INTRAVENOUS | Status: AC
  Filled 2011-11-01: qty 50

## 2011-11-01 MED ORDER — MORPHINE SULFATE 2 MG/ML IJ SOLN: 1.0000 mg | INTRAMUSCULAR | Status: DC | PRN

## 2011-11-01 MED ORDER — IOHEXOL 300 MG/ML  SOLN
80.0000 mL | Freq: Once | INTRAMUSCULAR | Status: AC | PRN
  Administered 2011-11-01: 80 mL via INTRAVENOUS

## 2011-11-01 MED ORDER — ACETAMINOPHEN 650 MG RE SUPP: 650.0000 mg | Freq: Four times a day (QID) | RECTAL | Status: DC | PRN

## 2011-11-01 MED ORDER — HYDROCODONE-ACETAMINOPHEN 5-325 MG PO TABS
1.0000 | ORAL_TABLET | ORAL | Status: DC | PRN
  Administered 2011-11-01 – 2011-11-10 (×32): 2 via ORAL
  Filled 2011-11-01 (×35): qty 2

## 2011-11-01 MED ORDER — ALUM & MAG HYDROXIDE-SIMETH 200-200-20 MG/5ML PO SUSP
30.0000 mL | Freq: Four times a day (QID) | ORAL | Status: DC | PRN
Start: 2011-11-01 — End: 2011-11-10

## 2011-11-01 MED ORDER — AZITHROMYCIN 500 MG IV SOLR
INTRAVENOUS | Status: AC
  Filled 2011-11-01: qty 500

## 2011-11-01 MED ORDER — ALPRAZOLAM 0.25 MG PO TABS
0.2500 mg | ORAL_TABLET | Freq: Three times a day (TID) | ORAL | Status: DC | PRN
  Administered 2011-11-02 – 2011-11-06 (×6): 0.25 mg via ORAL
  Filled 2011-11-01 (×6): qty 1

## 2011-11-01 MED ORDER — ACETAMINOPHEN 325 MG PO TABS: 650.0000 mg | ORAL_TABLET | Freq: Four times a day (QID) | ORAL | Status: DC | PRN

## 2011-11-01 MED ORDER — MORPHINE SULFATE 4 MG/ML IJ SOLN: 1.0000 mg | INTRAMUSCULAR | Status: DC | PRN

## 2011-11-01 MED ORDER — DEXTROSE 5 % IV SOLN
1.0000 g | INTRAVENOUS | Status: DC
  Administered 2011-11-02: 1 g via INTRAVENOUS
  Filled 2011-11-01: qty 10

## 2011-11-01 MED ORDER — GUAIFENESIN 100 MG/5ML PO SOLN
10.0000 mL | Freq: Two times a day (BID) | ORAL | Status: DC | PRN
  Administered 2011-11-03: 200 mg via ORAL
  Filled 2011-11-01: qty 10

## 2011-11-01 NOTE — Progress Notes (Signed)
  Subjective:    Patient ID: [name], female    DOB: December 29, 1985, 25 y.o.   MRN: [id number]  HPI Comments: Subjective: Patient presented to ED with several days of fever and cough productive of a green sputum. She did have some blood streaking but after several paroxysms of coughing she had frank hemoptysis. ED evaluation with CXR revealing possible infiltrate. No leukocytosis. Question of pulmonary hemoptysis vs mallory-weiss tear from cough. Currently feeling OK.  Objective: Vital signs in last 24 hours: Temp:  (98 F (36.7 C)-99.6 F (37.6 C)) 98.6 F (37 C) (11/07 0604) Pulse Rate:  (78-98) 86  (11/07 0604) Resp:  (18-20) 18  (11/07 0604) BP: (139-151)/(58-83) 139/76 mmHg (11/07 0604) SpO2:  (82 %-98 %) 92 % (11/07 0604) Weight:  (163 lb 5.8 oz (74.1 kg)) 163 lb 5.8 oz (74.1 kg) (11/07 0239) Weight change:  Last BM Date: 10/31/11  Physical Exam: Vitals reviewed HEENT - [place]/AT and normal Neck- supple Nodes- no cervical or supraclavicular nodes Chest - increased AP diameter, good breath sounds w/o rales or wheezing Cor -better heart sounds on the right, RRR  Intake/Output from previous day:     Assessment/Plan: \ Pneumonia due to organism (11/01/2011)   Assessment: admitted for pneumonia but w/o leukocytosis or fever. CXR abnl.   Plan: continue rocephin #1 and Azithro #!            CT chest w/ contrast r/o other cause for hemoptysis  ANXIETY (08/01/2010)   Assessment: stable and calm   Plan: noneed for additional medication  GERD (08/01/2010)   Assessment: asymptomatic   Plan: continue liquid antacids prn.  Dispo- if CT is negative and pt has no recurrent hemoptysis will plan for d/c Nov 8     Illene Regulus 11/01/2011, 8:09 AM     URI       Review of Systems     Objective:   Physical Exam        Assessment & Plan:

## 2011-11-01 NOTE — H&P (Signed)
[name] is an 25 y.o. female.   Chief Complaint: C/O Hemoptysis HPI: 25 year old with multiple medical problems here with Hemoptysis, shortness of breath and subjective fevers for 2 days. Has apparently "one" functioning lung. Chest X ray shows Right lower lobe Pneumonia. No other bleeding episodes or sites.  Past Medical History  Diagnosis Date  . Back pain   . GERD (gastroesophageal reflux disease)   . Asthma   . Hypertension     History reviewed. No pertinent past surgical history.  History reviewed. No pertinent family history. Social History:  reports that she has been smoking.  She does not have any smokeless tobacco history on file. She reports that she drinks alcohol. She reports that she does not use illicit drugs.  Allergies: No Known Allergies  Medications Prior to Admission  Medication Dose Route Frequency Provider Last Rate Last Dose  . azithromycin (ZITHROMAX) 500 mg in dextrose 5 % 250 mL IVPB  500 mg Intravenous Once Lisette Paz, PA   500 mg at 11/01/11 0211  . azithromycin (ZITHROMAX) 500 MG injection           . cefTRIAXone (ROCEPHIN) 1 g in dextrose 5 % 50 mL IVPB  1 g Intravenous Once [place], PA   1 g at 11/01/11 0118  . dextrose 5 % solution           . dextrose 5 % solution           . HYDROcodone-acetaminophen (NORCO) 5-325 MG per tablet 1 tablet  1 tablet Oral Once [place], [place]   1 tablet at 10/31/11 2251  . sodium chloride 0.9 % bolus 500 mL  500 mL Intravenous Once Lisette Paz, PA   500 mL at 11/01/11 0117   Medications Prior to Admission  Medication Sig Dispense Refill  . ALPRAZolam (XANAX) 0.25 MG tablet TAKE ONE TO TWO TABLETS BY MOUTH EVERY DAY AS NEEDED  60 tablet  5    Results for orders placed during the hospital encounter of 10/31/11 (from the past 48 hour(s))  CBC     Status: Abnormal   Collection Time   10/31/11 10:30 PM      Component Value Range Comment   WBC 8.3  4.0 - 10.5 (K/uL)    RBC 5.68 (*) 3.87 - 5.11 (MIL/uL)      Hemoglobin 17.7 (*) 12.0 - 15.0 (g/dL)    HCT 16.1 (*) 09.6 - 46.0 (%)    MCV 91.2  78.0 - 100.0 (fL)    MCH 31.2  26.0 - 34.0 (pg)    MCHC 34.2  30.0 - 36.0 (g/dL)    RDW 04.5 (*) 40.9 - 15.5 (%)    Platelets 181  150 - 400 (K/uL)   DIFFERENTIAL     Status: Abnormal   Collection Time   10/31/11 10:30 PM      Component Value Range Comment   Neutrophils Relative 83 (*) 43 - 77 (%)    Neutro Abs 6.8  1.7 - 7.7 (K/uL)    Lymphocytes Relative 9 (*) 12 - 46 (%)    Lymphs Abs 0.8  0.7 - 4.0 (K/uL)    Monocytes Relative 8  3 - 12 (%)    Monocytes Absolute 0.6  0.1 - 1.0 (K/uL)    Eosinophils Relative 0  0 - 5 (%)    Eosinophils Absolute 0.0  0.0 - 0.7 (K/uL)    Basophils Relative 1  0 - 1 (%)  Basophils Absolute 0.0  0.0 - 0.1 (K/uL)   COMPREHENSIVE METABOLIC PANEL     Status: Abnormal   Collection Time   10/31/11 10:30 PM      Component Value Range Comment   Sodium 132 (*) 135 - 145 (mEq/L)    Potassium 5.7 (*) 3.5 - 5.1 (mEq/L) HEMOLYSIS AT THIS LEVEL MAY AFFECT RESULT   Chloride 94 (*) 96 - 112 (mEq/L)    CO2 18 (*) 19 - 32 (mEq/L)    Glucose, Bld 101 (*) 70 - 99 (mg/dL)    BUN 15  6 - 23 (mg/dL)    Creatinine, Ser 1.61  0.50 - 1.10 (mg/dL)    Calcium 9.6  8.4 - 10.5 (mg/dL)    Total Protein 8.0  6.0 - 8.3 (g/dL)    Albumin 4.1  3.5 - 5.2 (g/dL)    AST 44 (*) 0 - 37 (U/L) HEMOLYSIS AT THIS LEVEL MAY AFFECT RESULT   ALT 24  0 - 35 (U/L) HEMOLYSIS AT THIS LEVEL MAY AFFECT RESULT   Alkaline Phosphatase 69  39 - 117 (U/L) HEMOLYSIS AT THIS LEVEL MAY AFFECT RESULT   Total Bilirubin 1.6 (*) 0.3 - 1.2 (mg/dL)    GFR calc non Af Amer >90  >90 (mL/min)    GFR calc Af Amer >90  >90 (mL/min)   D-DIMER, QUANTITATIVE     Status: Normal   Collection Time   10/31/11 10:30 PM      Component Value Range Comment   D-Dimer, Quant 0.42  0.00 - 0.48 (ug/mL-FEU)    Dg Chest 2 View  10/31/2011  *RADIOLOGY REPORT*  Clinical Data: Shortness of breath, cough.  CHEST - 2 VIEW  Comparison:  07/23/2010  Findings: Cardiomegaly.  Central vascular congestion.  Mild patchy opacity in the right lower lobe. No pleural effusion or pneumothorax.  No acute osseous abnormality.  Curvature of the thoracic spine, with associated thoracic rod hardware is without interval change.  IMPRESSION: Cardiomegaly with central vascular congestion.  Patchy right lower lung opacity may reflect asymmetric edema or infection.  Original Report Authenticated By: Waneta Martins, M.D.    Review of Systems  Constitutional: Positive for fever and malaise/fatigue.  HENT: Negative.   Eyes: Negative.   Respiratory: Positive for cough, hemoptysis and shortness of breath.   Cardiovascular: Negative.   Gastrointestinal: Negative.   Genitourinary: Negative.   Musculoskeletal: Positive for myalgias and joint pain.  Skin: Negative.   Neurological: Positive for weakness.  Endo/Heme/Allergies: Negative.   Psychiatric/Behavioral: Negative.     Blood pressure 146/79, pulse 78, temperature 98.2 F (36.8 C), temperature source Oral, resp. rate 18, height 5' (1.524 m), weight 74.1 kg (163 lb 5.8 oz), last menstrual period 10/25/2011, SpO2 95.00%. Physical Exam  Constitutional: She is oriented to person, place, and time. She appears well-developed and well-nourished.  HENT:  Head: Normocephalic and atraumatic.  Right Ear: External ear normal.  Left Ear: External ear normal.  Eyes: Conjunctivae and EOM are normal. Pupils are equal, round, and reactive to light.  Neck: Normal range of motion. Neck supple.  Cardiovascular: Normal rate, regular rhythm, normal heart sounds and intact distal pulses.   Respiratory: She has rales.  GI: Soft. Bowel sounds are normal.  Neurological: She is alert and oriented to person, place, and time. She has normal reflexes.  Skin: Skin is warm and dry.  Psychiatric: She has a normal mood and affect.     Assessment/Plan 1. Pneumonia: Treat as Community acquired Pneumonia 2. Hemoptysis:  Probably due to the Pneumonia: Admit for observation and see if it returns 3.Asthma:No SOB/Wheeze 4. GERD: PPI  Ted Leonhart,LAWAL 11/01/2011, 3:37 AM

## 2011-11-01 NOTE — ED Provider Notes (Signed)
Medical screening examination/treatment/procedure(s) were performed by non-physician practitioner and as supervising physician I was immediately available for consultation/collaboration.   Teara Duerksen R. Coriana Angello, MD 11/01/11 0807 

## 2011-11-02 ENCOUNTER — Telehealth: Payer: Self-pay

## 2011-11-02 LAB — COMPREHENSIVE METABOLIC PANEL
AST: 33 U/L (ref 0–37)
Albumin: 3.7 g/dL (ref 3.5–5.2)
Alkaline Phosphatase: 77 U/L (ref 39–117)
Chloride: 96 mEq/L (ref 96–112)
Creatinine, Ser: 0.69 mg/dL (ref 0.50–1.10)
Potassium: 4.3 mEq/L (ref 3.5–5.1)
Total Bilirubin: 0.9 mg/dL (ref 0.3–1.2)
Total Protein: 7 g/dL (ref 6.0–8.3)

## 2011-11-02 MED ORDER — MOXIFLOXACIN HCL 400 MG PO TABS: 400.0000 mg | ORAL_TABLET | Freq: Every day | ORAL | Status: DC

## 2011-11-02 MED ORDER — MOXIFLOXACIN HCL 400 MG PO TABS
400.0000 mg | ORAL_TABLET | Freq: Every day | ORAL | Status: DC
  Administered 2011-11-02: 400 mg via ORAL
  Filled 2011-11-02 (×2): qty 1

## 2011-11-02 MED ORDER — GUAIFENESIN-DM 100-10 MG/5ML PO SYRP: 5.0000 mL | ORAL_SOLUTION | Freq: Four times a day (QID) | ORAL | Status: AC | PRN

## 2011-11-02 NOTE — Progress Notes (Signed)
  Subjective:    Patient ID: [name], female    DOB: 1986-03-24, 25 y.o.   MRN: [id number]  HPI Comments: Discharge to home. Dictation # [id number]  URI       Review of Systems     Objective:   Physical Exam        Assessment & Plan:

## 2011-11-02 NOTE — Telephone Encounter (Signed)
[facility] called stating that pt is being discharged today but she had been on O2 during her hospital stay. RN says she had been trying to wean pt off of O2 but her O2 sats drop down into the mid 80s. RN is requesting an order for supplemental O2 for home use. Please advise.

## 2011-11-02 NOTE — Telephone Encounter (Signed)
Called hospital and cancelled d/c. D/c  azithro and rocephin. Start po avelox 400 mg qd

## 2011-11-02 NOTE — Discharge Summary (Signed)
[name], [name] NO.:  [id number]  MEDICAL RECORD NO.:  [id number]  LOCATION:  3008                         FACILITY:  [facility]  PHYSICIAN:  Maria Gess. Mackie Holness, MD  DATE OF BIRTH:  [birth year]  DATE OF ADMISSION:  10/31/2011 DATE OF DISCHARGE:  11/02/2011                              DISCHARGE SUMMARY   ADMITTING DIAGNOSES: 1. Pneumonia. 2. Changes of tetralogy of Fallot, status post surgical correction.  DISCHARGE DIAGNOSES: 1. Pneumonia. 2. Changes of tetralogy of Fallot, status post surgical correction.  CONSULTANTS:  None.  PROCEDURES: 1. Chest x-ray at admission, which was read as cardiomegaly with     central vascular congestion.  Patchy right lower lung opacity which     may reflect asymmetric edema versus infection 2. CT chest with contrast.  Final summary is airspace consolidation of     left lower lobe which could reflect pneumonia versus pulmonary     hemorrhage given history of hemoptysis.  Focal areas of air     trapping in both lungs consistent with asthma and/or bronchitis.     Overall diminished vascularity throughout the left lung relative to     the right associated with lower left lung volume unchanged since     July 2011, consistent with Swyer James syndrome.  Stable marked     cardiomegaly with massive right atrial enlargement and right     ventricular hypertrophy.  Probable right heart failure and/or     tricuspid regurgitation.  Possible hepatic cirrhosis or venous     congestion syndrome.  HISTORY OF PRESENT ILLNESS:  [name] is a 25 year old woman with multiple medical problems stemming from her correction of tetralogy of Fallot, who has had a problem with shortness of breath and fevers for 2 days prior to presentation.  She did present because she had significant hemoptysis which she reports occurred after a paroxysm of heart coughing.  Chest x-ray did show right lower lobe consolidation suggestive of pneumonia with no  other bleeding episodes or sites noted.  Please see the H and P for past medical history, family history, social history, and admission physical exam.  HOSPITAL COURSE:  Pneumonia, the patient was started on Rocephin and azithromycin.  She had a followup CT scan of the chest because of her hemoptysis with results shown above.  The patient remained afebrile with a normal white blood count.  She reports her respirations improved. Given that she is hemodynamically stable, has normal oxygen saturation and has no further hemoptysis.  She is stable for discharge home to complete outpatient antibiotic therapy.  DISCHARGE EXAMINATION:  VITAL SIGNS:  Temperature was 98.7, blood pressure 140/84, heart rate 73, respirations 18, and O2 sats 94% on 2 L. GENERAL APPEARANCE:  This is an overweight Caucasian woman in no acute distress. HEENT: Unremarkable. CHEST:  The patient is moving air well.  I did not appreciate any rales or wheezes.  There is no increased work of breathing. CARDIOVASCULAR:  2+ radial pulse.  Her precordium was quiet with a regular rate and rhythm.  No further examination conducted.  FINAL LABORATORY:  On the day of discharge, the patient's basic chemistry panel was normal with a potassium of 4.3, creatinine  is 0.69, glucose of 136, liver functions were normal.  CBC that was done at admission with a white count of 8300, hemoglobin is 17.7 g, platelets 181,000, differential with 83% segs, 9% lymphs, 1% basophils.  DISCHARGE MEDICATIONS:  The patient will resume her home medications. We will have her start on Avelox 400 mg p.o. daily for 10 days for treatment of her respiratory infection.  DISPOSITION:  The patient is to be discharged to home.  She will be seen in the office on Tuesday 1 week from yesterday which will be November 15.  She is carefully instructed that if she has any recurrent hemoptysis, respiratory distress or difficulty, inability to take medications, she is  to call for immediate evaluation.  THE PATIENT'S CONDITION AT THE TIME OF DISCHARGE DICTATION:  Stable and improved.     Maria Gess Dominiq Fontaine, MD     MEN/MEDQ  D:  11/02/2011  T:  11/02/2011  Job:  [id number]

## 2011-11-03 ENCOUNTER — Encounter (HOSPITAL_COMMUNITY): Payer: Self-pay | Admitting: Pulmonary Disease

## 2011-11-03 DIAGNOSIS — I2789 Other specified pulmonary heart diseases: Secondary | ICD-10-CM

## 2011-11-03 DIAGNOSIS — R042 Hemoptysis: Secondary | ICD-10-CM

## 2011-11-03 MED ORDER — HYDROCODONE-ACETAMINOPHEN 10-325 MG PO TABS
1.0000 | ORAL_TABLET | Freq: Three times a day (TID) | ORAL | Status: AC
  Administered 2011-11-03 – 2011-11-05 (×6): 1 via ORAL
  Filled 2011-11-03 (×6): qty 1

## 2011-11-03 MED ORDER — WHITE PETROLATUM GEL
Status: AC
  Administered 2011-11-03: 03:00:00
  Filled 2011-11-03: qty 5

## 2011-11-03 MED ORDER — MORPHINE SULFATE 2 MG/ML IJ SOLN
1.0000 mg | INTRAMUSCULAR | Status: DC | PRN
  Administered 2011-11-05 – 2011-11-10 (×4): 1 mg via INTRAVENOUS
  Filled 2011-11-03 (×4): qty 1

## 2011-11-03 NOTE — Consult Note (Signed)
Reason for Consult: PNA Referring Physician:  [facility] Consult Note Date: 11/03/2011  Patient name: [name] Medical record number: [id number] Date of birth: July 24, 1986 Age: 25 y.o. Gender: female PCP: Illene Regulus  Anti-infectives:  11/6 Azithro>>>11/8 11/6 Rocephin>>>11/8 11/8 Avelox>>> 11/9   Best Practice/Protocols:  Pt is ambulatory.  GI proph not indicated.   Consults: 11/9 PCCM   Studies: Dg Chest 2 View  10/31/2011  CHEST: Cardiomegaly with central vascular congestion.  Patchy right lower lung opacity may reflect asymmetric edema or infection.    11/01/2011   CT CHEST WITH CONTRAST 11/01/2011:  1.  Airspace consolidation in the left lower lobe which could reflect pneumonia or pulmonary hemorrhage, given the history of hemoptysis. No acute cardiopulmonary disease otherwise. 2.  Focal areas of air trapping in both lungs consistent with asthma and/or bronchitis. 3.  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 4.  Stable marked cardiomegaly with massive right atrial enlargement and right ventricular hypertrophy.  Probable right heart failure and/or tricuspid regurgitation.  5.  Possible hepatic cirrhosis or venous congestion syndrome. 6.  Upper normal size spleen which has increased in size since July, 2011.    History of Present Illness: [name] is a 25 y/o WF, former smoker,  with PMH of tetralogy of fallot, depression and sciolosis (with rods) who was admitted 11/6 per Dr. Arthur Holms for presumed PNA/URI.  She indicates she began with runny nose, and coughing on 11/5 and progressed to cough with green sputum.  11/7 had episode of coughing/sneezing with sudden onset sharp left sided pain after coughing.  She then began having blood tinged sputum,  and has had two episodes of large volume ("half a sink full").  She now indicates the coughing is better.  She denies fevers, chills but endorses fatigue/aches.  No other associated symptoms.    Allergies: Review of patient's allergies indicates no known allergies.  Past Medical History  Diagnosis Date  . Back pain   . GERD (gastroesophageal reflux disease)   . Asthma   . Hypertension   . Scoliosis deformity of spine   . Tetralogy of Fallot s/p repair     History reviewed. No pertinent past surgical history.  History reviewed. No pertinent family history.  History   Social History  . Marital Status: Single    Spouse Name: N/A    Number of Children: N/A  . Years of Education: N/A   Occupational History  . Not on file.   Social History Main Topics  . Smoking status: Former Smoker -- 1.5 packs/day for 10 years    Types: Cigarettes    Quit date: 02/02/2011  . Smokeless tobacco: Not on file  . Alcohol Use: Yes  . Drug Use: No  . Sexually Active: Not on file   Other Topics Concern  . Not on file   Social History Narrative   Unemployed at present.  Worked as Conservation officer, nature at [employer].   Prior to Admission medications   Medication Sig Start Date End Date Taking? Authorizing Provider  ALPRAZolam Prudy Feeler) 0.25 MG tablet TAKE ONE TO TWO TABLETS BY MOUTH EVERY DAY AS NEEDED 09/23/11  Yes Duke Salvia, MD  FLUoxetine (PROZAC) 20 MG capsule Take 20 mg by mouth daily.     Yes Historical Provider, MD  guaiFENesin-dextromethorphan (ROBITUSSIN DM) 100-10 MG/5ML  syrup Take 5 mLs by mouth 4 (four) times daily as needed for cough. 11/02/11 11/12/11  Duke Salvia, MD  moxifloxacin (AVELOX) 400 MG tablet Take 1 tablet (400 mg total) by mouth daily. 11/02/11 11/12/11  Duke Salvia, MD        Review of Systems: Constitutional:   No  weight loss, night sweats,  Fevers, chills, fatigue. HEENT:   No headaches,  Difficulty swallowing,  Tooth/dental problems,  Endorses  Sore throat, sneezing,nasal congestion, post nasal drip, & cough.  CV:  No chest pain,  Orthopnea, PND, swelling in lower extremities, anasarca, dizziness, palpitations  GI:  No heartburn, indigestion, abdominal pain, nausea, vomiting, diarrhea, change in       bowel habits, loss of appetite  Resp: No shortness of breath with exertion or at rest. SEE HPI  Skin: no rash or lesions.  GU: no dysuria, change in color of urine, no urgency or frequency.  No flank pain.  MS:  No joint pain or swelling.  No decreased range of motion.  No back pain.  Psych:  No change in mood or affect. No depression or anxiety.  No memory loss.    Physical Exam:  Filed Vitals:   11/02/11 0600 11/02/11 1535 11/02/11 2200 11/03/11 0600  BP: 130/74 155/81 139/79 134/69  Pulse: 86 89 78 81  Temp: 98.6 F (37 C) 98.2 F (36.8 C) 98.2 F (36.8 C) 98.3 F (36.8 C)  TempSrc:      Resp: 18 16 16 16   Height:      Weight:      SpO2: 92% 93% 91% 93%    Gen: Pleasant, well-nourished, in no distress,  normal affect  ENT: No lesions,  mouth clear,  oropharynx clear, no postnasal drip  Neck: No JVD, no TMG, no carotid bruits  Lungs: No use of accessory muscles, no dullness to percussion, clear without rales or rhonchi  Cardiovascular: RRR, 3/6 murmur noted  Abdomen: soft and NT, no HSM,  BS normal  Musculoskeletal: No deformities, no cyanosis  Neuro: alert, non focal  Skin: Warm, no lesions or rashes  Labs .CBC    Component Value Date/Time   WBC 8.3 10/31/2011 2230   RBC 5.68* 10/31/2011 2230   HGB 17.7* 10/31/2011 2230   HCT 51.8* 10/31/2011 2230   PLT 181 10/31/2011 2230   MCV 91.2 10/31/2011 2230   MCH 31.2 10/31/2011 2230   MCHC 34.2 10/31/2011 2230   RDW 16.1* 10/31/2011 2230   LYMPHSABS 0.8 10/31/2011 2230   MONOABS 0.6 10/31/2011 2230   EOSABS 0.0 10/31/2011 2230   BASOSABS 0.0 10/31/2011 2230    Assessment & Plan   Patient Active Hospital Problem List:  1. Hemoptysis: This is almost  certainly related to pulmonary hypertension. Doubt infectious process.  Plan:  -Scheduled cough suppression X 48 hrs -D/C Avelox -f/u cxr  2. ANXIETY  -chronic Plan: -rec's per primary      3. GERD (08/01/2010) Plan:  Cont current Rx  4) Cong Heart Disease with secondary PAH - Scheduled to see Dr Tenny Craw - It might be worth an eval for pulmonary hypertension. I am not sure if there is a role for pulmonary vasodilators in this setting.    OLLIS,BRANDI  11/03/2011   Pt seen and examined and database reviewed. I agree with above findings, assessment and plan. I discussed with Dr Debby Bud. We will see PRN. If she has 24 hrs of no further hemoptysis, she may be discharged to home with followup  as discussed above  Billy Fischer, MD;  PCCM service; Mobile [phone]

## 2011-11-03 NOTE — Progress Notes (Signed)
  Subjective:    Patient ID: [name], female    DOB: [birth year], 25 y.o.   MRN: [id number]  HPI Comments: Subjective: Patient reports that she feels pretty good. She does report SOB and she admits to an episode of hemoptysis last night: after a brief cough she brought up fresh blood.   Objective: Vitals noted - afebrile , O2 sat 93% 2 l, drops to 80's on RA No new labs   Physical Exam: Gen'l WNWD white woman' Chest - mild kyphosis, increased AP diameter Lungs - decreased breath sounds throughout, very few breath sounds left base     Assessment/Plan: 1. Pneumonia - no fever, never had a leukocytosis. Was changed to Avelox yesterday. Question of bronchitis rather than pneumonia Plan - continue Avelox  2.Pul/hemoptysis- initially favored Mallory-weiss tear as source of hemoptysis. But, CT suggests hemorrhage and she has had recurrent hemoptysis without paroxysm of coughing. Vascular disease, anatomic issues. Plan - Pulmonary consult.   3. Anxiety - stable.   Casimiro Needle Parissa Chiao 11/03/2011, 7:33 AM     URI       Review of Systems     Objective:   Physical Exam        Assessment & Plan:

## 2011-11-03 NOTE — Progress Notes (Signed)
11-03-11 UR completed. Carolos Fecher RN BSN  

## 2011-11-04 ENCOUNTER — Inpatient Hospital Stay (HOSPITAL_COMMUNITY): Payer: Medicaid Other

## 2011-11-04 LAB — HEMOGLOBIN AND HEMATOCRIT, BLOOD: HCT: 47.3 % — ABNORMAL HIGH (ref 36.0–46.0)

## 2011-11-04 MED ORDER — GUAIFENESIN-DM 100-10 MG/5ML PO SYRP
5.0000 mL | ORAL_SOLUTION | ORAL | Status: DC | PRN
  Administered 2011-11-04 – 2011-11-05 (×4): 5 mL via ORAL
  Filled 2011-11-04 (×5): qty 5

## 2011-11-04 NOTE — Progress Notes (Signed)
Subjective: Appreciate Pulmonary consult-recommendations reviewed. Reports that she feels less short of breath. Does admit to 3 episodes of hemoptysis.  Objective: No new lab CXR report is pending Ambulatory O2 sats +/- oxygen not recorded   Physical Exam: Vitals reviewed Gen'l - heavy-set white woman sitting in a chair NAD Chest- increased AP diameter Lungs- decreased BS w/o rales/wheezes      Assessment/Plan: 1. PNA- no evidence of infection. Avelox stopped. Will order cough suppressant  2. 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  Plan - check CBC,            Ambulatory O2 sats +/- oxygen    Illene Regulus 11/04/2011, 7:18 AM

## 2011-11-04 NOTE — Plan of Care (Signed)
Problem: Phase II Progression Outcomes Goal: Wean O2 if indicated Pt ambulated down hall and O2 sat dropped to 61% on RA; lips became cyanotic; pt  Became very SOB. Unable to wean

## 2011-11-05 ENCOUNTER — Encounter (HOSPITAL_COMMUNITY): Payer: Self-pay | Admitting: Cardiology

## 2011-11-05 DIAGNOSIS — R042 Hemoptysis: Secondary | ICD-10-CM

## 2011-11-05 MED ORDER — PROMETHAZINE-CODEINE 6.25-10 MG/5ML PO SYRP
5.0000 mL | ORAL_SOLUTION | ORAL | Status: DC | PRN
  Administered 2011-11-05 – 2011-11-10 (×15): 5 mL via ORAL
  Filled 2011-11-05 (×15): qty 5

## 2011-11-05 NOTE — Progress Notes (Signed)
Pt. Continues with hemoptysis.  C & R approx. 80 ml. Of frank blood.  MDs are aware.  Treatment plan ongoing.

## 2011-11-05 NOTE — Consult Note (Signed)
CARDIOLOGY CONSULT NOTE  Patient ID: [name] MRN: [id number] DOB/AGE: 09-02-1986 25 y.o.  Admit date: 10/31/2011 Primary Physician  Dr. Debby Bud Primary Cardiologist Scheduled to see Dr. Dietrich Pates Chief Complaint  Hemoptysis  HPI:  Patient admitted with hemoptysis and pneumonia.  She was treated with antibiotics.  Pulmonary was consulted.  CT results are reported below.  There is evidence for pulmonary HTN.  We are asked to consult.     The patient has a history of tetralogy of flow. She apparently had a Blalock procedure at 1 months followed by tetralogy repair at 2 years. We have none of these records. He was done at [facility]. She saw Dr. Clelia Croft for years but stopped seeing him about 9 years ago. She does mention that her last evaluation at age 25 and basically apparently included a heart catheterization and she was going to have a stent. I'm not clear on any details but do know that she said this wasn't done. She's had no followup echocardiograms. She says she has otherwise done fairly well. She might have some mild dyspnea on exertion at times but she does not have PND or orthopnea. She's never had any palpitations, presyncope or syncope. She doesn't typically get chest pressure, neck or arm discomfort. She has no weight gain or edema.  She says she was doing relatively well until she got cold-like symptoms with coughing and sneezing about 10 days ago. She had some mild green sputum with blood streaking. She had a bad sneezing episode develop some pain under her right rib cage and then noticed some frank red blood with coughing. She has had this intermittently since then. She otherwise is feeling relatively well.   Past Medical History  Diagnosis Date  . Back pain   . GERD (gastroesophageal reflux disease)   . Asthma     As a child  . Scoliosis deformity of spine   . Anxiety     Past Surgical History  Procedure Date  . Tetralogy of fallot repair     Age 1 at [facility]  . Blalock  procedure     3 months  . Back surgery     Rods for scoliosis    Family History  Problem Relation Age of Onset  . Diabetes Mother     Prescriptions prior to admission  Medication Sig Dispense Refill  . ALPRAZolam (XANAX) 0.25 MG tablet TAKE ONE TO TWO TABLETS BY MOUTH EVERY DAY AS NEEDED  60 tablet  5  . FLUoxetine (PROZAC) 20 MG capsule Take 20 mg by mouth daily.        Marland Kitchen DISCONTD: guaiFENesin (ROBITUSSIN) 100 MG/5ML SOLN Take 10 mLs by mouth 2 (two) times daily as needed. For cough        No Known Allergies History   Social History  . Marital Status: Single    Spouse Name: N/A    Number of Children: 0  . Years of Education: N/A   Occupational History  . Not on file.   Social History Main Topics  . Smoking status: Former Smoker -- 1.5 packs/day for 10 years    Types: Cigarettes    Quit date: 02/02/2011  . Smokeless tobacco: Not on file  . Alcohol Use: Yes  . Drug Use: No  . Sexually Active: Not on file   Other Topics Concern  . Not on file   Social History Narrative   Unemployed at present.  Worked as Conservation officer, nature at [employer].    ROS: Back  pain.  Otherwise as stated in the HPI and negative for all other systems.  Physical Exam:  BP 143/56  Pulse 95  Temp(Src) 100.1 F (37.8 C) (Oral)  Resp 16  Ht 5' (1.524 m)  Wt 74.1 kg (163 lb 5.8 oz)  BMI 31.90 kg/m2  SpO2 93%  LMP 10/25/2011 GENERAL:  Well appearing HEENT:  Pupils equal round and reactive, fundi not visualized, oral mucosa unremarkable NECK:  Positive jugular venous distention, waveform within normal limits, carotid upstroke brisk and symmetric, no bruits, no thyromegaly LYMPHATICS:  No cervical, inguinal adenopathy LUNGS:  Clear to auscultation bilaterally BACK:  No CVA tenderness, surgical scar CHEST:  Healed right thoracotomy scar and sternal scar HEART:  PMI not displaced or sustained, palpable impulse at left upper sternal border over the RV outflow tract S1 and S2 within normal limits, no S3, no  S4, no clicks, no rubs, holosystolic murmur loudest out the left upper sternal border, soft diastolic RUSB murmur. ABD:  Flat, positive bowel sounds normal in frequency in pitch, no bruits, no rebound, no guarding, no midline pulsatile mass, positive hepatomegaly, no splenomegaly, obese EXT:  2 plus pulses throughout, no edema, no cyanosis no clubbing SKIN:  No rashes no nodules NEURO:  Cranial nerves II through XII grossly intact, motor grossly intact throughout PSYCH:  Cognitively intact, oriented to person place and time  Labs: Lab Results  Component Value Date   BUN 14 11/02/2011   Lab Results  Component Value Date   CREATININE 0.69 11/02/2011   Lab Results  Component Value Date   NA 136 11/02/2011   K 4.3 11/02/2011   CL 96 11/02/2011   CO2 31 11/02/2011   Lab Results  Component Value Date   WBC 8.3 10/31/2011   HGB 15.6* 11/04/2011   HCT 47.3* 11/04/2011   MCV 91.2 10/31/2011   PLT 181 10/31/2011    Lab Results  Component Value Date   ALT 31 11/02/2011   AST 33 11/02/2011   ALKPHOS 77 11/02/2011   BILITOT 0.9 11/02/2011      Radiology:Radiology:. Airspace consolidation in the left lower lobe which could  reflect pneumonia or pulmonary hemorrhage, given the history of  hemoptysis. No acute cardiopulmonary disease otherwise.  2. Focal areas of air trapping in both lungs consistent with  asthma and/or bronchitis.  3. 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  4. Stable marked cardiomegaly with massive right atrial  enlargement and right ventricular hypertrophy. Probable right  heart failure and/or tricuspid regurgitation.  5. Possible hepatic cirrhosis or venous congestion syndrome.  6. Upper normal size spleen which has increased in size since  July, 2011.   EKG:  Pending  ASSESSMENT AND PLAN:  Hemoptysis:  I agree that this is likely related to her congenital heart disease. I will start with an echocardiogram. I will also order an EKG. We will need her old records from [facility]. We can then decide on further invasive evaluation such as right heart catheterization based on these results.  TOF repair:  As above  Back pain:  Pain management per Dr. Debby Bud.  SignedRollene Rotunda 11/05/2011, 11:21 AM

## 2011-11-05 NOTE — Progress Notes (Signed)
Pt had an episode of cough with hemoptysis, had coughed about 40 ml's of blood

## 2011-11-05 NOTE — Clinical Documentation Improvement (Signed)
Pt. Had been given Phenergan with Codeine as ordered on a prn basis earlier in the afternoon.  No further problems with hemoptysis observed.

## 2011-11-05 NOTE — Progress Notes (Signed)
Subjective: Still coughing - pure blood. Not short of breath at rest but cannot exert w/o dyspnea  Objective: Hgb 15.5 - a 2.1g drop since admission ambuatory Oxygen sat - 61%!!!   Physical Exam: Vitals noted - especially oxygen sat with activity Chest - decreased BS, no rales  Cor - RRR     Assessment/Plan: 2. Pul/hemoptysis - continued problem and possibly getting worse. Has had a 2 g drop in Hgb since admission.  Plan - will change to codeine cough syrup           Cardiology consult while in-patient            Continuous oxygen   Illene Regulus 11/05/2011, 10:02 AM

## 2011-11-05 NOTE — Progress Notes (Signed)
*  PRELIMINARY RESULTS* Echocardiogram 2D Echocardiogram has been performed.  Clide Deutscher RDCS 11/05/2011, 4:00 PM

## 2011-11-06 DIAGNOSIS — R042 Hemoptysis: Secondary | ICD-10-CM

## 2011-11-06 NOTE — Progress Notes (Signed)
Subjective:  The patient has a history of Tetralogy of Fallot. She apparently had a Blalock procedure at 3 months followed by tetralogy repair at 2 years. We have none of these records. He was done at [facility]. She saw Dr. Clelia Croft for years but stopped seeing him about 9 years ago. She does mention that her last evaluation at age 25 and basically apparently included a heart catheterization and she was going to have a stent. I'm not clear on any details but do know that she said this wasn't done. She's had no followup echocardiograms.    She says she was doing relatively well until she got cold-like symptoms with coughing and sneezing about 10 days ago. She had some mild green sputum with blood streaking. She had a bad sneezing episode develop some pain under her right rib cage and then noticed some frank red blood with coughing. She has had this intermittently since then. She otherwise is feeling relatively well.       Marland Kitchen FLUoxetine  20 mg Oral Daily  . HYDROcodone-acetaminophen  1 tablet Oral TID      Objective:  Vital Signs in the last 24 hours: Blood pressure 138/82, pulse 103, temperature 99.6 F (37.6 C), temperature source Oral, resp. rate 16, height 5' (1.524 m), weight 163 lb 5.8 oz (74.1 kg), last menstrual period 10/25/2011, SpO2 97.00%. Temp:  [98.7 F (37.1 C)-101.3 F (38.5 C)] 99.6 F (37.6 C) (11/11 2320) Pulse Rate:  [83-103] 103  (11/11 2200) Resp:  [16-18] 16  (11/11 2200) BP: (138-146)/(82-83) 138/82 mmHg (11/11 2200) SpO2:  [91 %-97 %] 97 % (11/11 2200)  Intake/Output from previous day: 11/11 0701 - 11/12 0700 In: 1380 [P.O.:1380] Out: 2  Intake/Output from this shift:    Physical Exam: The patient is alert and oriented x 3.  The mood and affect are normal.   Skin: warm and dry.  Color is normal.    HEENT:   the sclera are nonicteric.  The mucous membranes are moist.  The carotids are 2+ without bruits.  There is no thyromegaly.  There is no JVD.    Lungs: clear.   The chest wall is non tender.    Heart: regular rate with a normal S1 and S2.  There is a soft systolic murmur  Abdomen: good bowel sounds.  There is no guarding or rebound.  There is no hepatosplenomegaly or tenderness.  There are no masses.   Extremities:  no clubbing, cyanosis, or edema.  The legs are without rashes.  The distal pulses are intact.   Neuro:  Cranial nerves II - XII are intact.  Motor and sensory functions are intact.  Gait was not assessed.   Lab Results:  Basename 11/04/11 0825  WBC --  HGB 15.6*  PLT --   No results found for this basename: NA:2,K:2,CL:2,CO2:2,GLUCOSE:2,BUN:2,CREATININE:2 in the last 72 hours No results found for this basename: TROPONINI:2,CK,MB:2 in the last 72 hours No results found for this basename: BNP in the last 72 hours Hepatic Function Panel No results found for this basename: PROT,ALBUMIN,AST,ALT,ALKPHOS,BILITOT,BILIDIR,IBILI in the last 72 hours Lab Results  Component Value Date   CHOL  Value: 160        ATP III CLASSIFICATION:  <200     mg/dL   Desirable  161-096  mg/dL   Borderline High  >=045    mg/dL   High        04/02/8118   HDL 38* 07/24/2010   LDLCALC  Value: 105  Total Cholesterol/HDL:CHD Risk Coronary Heart Disease Risk Table                     Men   Women  1/2 Average Risk   3.4   3.3  Average Risk       5.0   4.4  2 X Average Risk   9.6   7.1  3 X Average Risk  23.4   11.0        Use the calculated Patient Ratio above and the CHD Risk Table to determine the patient's CHD Risk.        ATP III CLASSIFICATION (LDL):  <100     mg/dL   Optimal  454-098  mg/dL   Near or Above                    Optimal  130-159  mg/dL   Borderline  119-147  mg/dL   High  >829     mg/dL   Very High* 5/62/1308   TRIG 84 07/24/2010   CHOLHDL 4.2 07/24/2010   No results found for this basename: PROTIME in the last 72 hours  Assessment/Plan:   Hemoptysis, unspecified (11/01/2011)   Assessment: This has largely resolved.  Possible due to  bronchitis with superimposed Tetralogy of Fallot   Plan: Continue current meds   TETRALOGY OF FALLOT, HX OF (08/01/2010)   Assessment: Will continue with current meds.  Echo performed yesterday - report pending.   Plan: continue current meds.      Vesta Mixer, Montez Hageman., MD, [facility] 11/06/2011, 7:54 AM

## 2011-11-06 NOTE — Progress Notes (Signed)
Subjective: No complaints- has not had any hemoptysis since yesterday. Still SOB with exertion Known to desat with any exertion  Objective: No new lab 2 D echo pending   Physical Exam: Vitals stable  No exam- patient indisposed- BR     Assessment/Plan: 2.Hemoptysis - most likely pulmonary hemorrhage secondary to Pulmonary hypertension secondary to right heart failure secondary to repair of tetrology.  Plan - cardiology to assist dx and mgt.           With desaturation with minimal exertion she may be WHO III or IV and             Possibly candidate for sildenifil therapy. She will definitely need home              Oxygen at time of discharge.    Casimiro Needle Norins 11/06/2011, 8:01 AM

## 2011-11-07 LAB — CBC
HCT: 45.1 % (ref 36.0–46.0)
RBC: 4.96 MIL/uL (ref 3.87–5.11)
RDW: 15.9 % — ABNORMAL HIGH (ref 11.5–15.5)
WBC: 7.5 10*3/uL (ref 4.0–10.5)

## 2011-11-07 LAB — DIFFERENTIAL
Basophils Absolute: 0.2 10*3/uL — ABNORMAL HIGH (ref 0.0–0.1)
Lymphocytes Relative: 31 % (ref 12–46)
Monocytes Relative: 7 % (ref 3–12)
Neutro Abs: 4.4 10*3/uL (ref 1.7–7.7)

## 2011-11-07 MED ORDER — FUROSEMIDE 40 MG PO TABS
40.0000 mg | ORAL_TABLET | Freq: Two times a day (BID) | ORAL | Status: DC
  Administered 2011-11-07 – 2011-11-10 (×7): 40 mg via ORAL
  Filled 2011-11-07 (×11): qty 1

## 2011-11-07 MED ORDER — MOXIFLOXACIN HCL 400 MG PO TABS
400.0000 mg | ORAL_TABLET | ORAL | Status: DC
  Administered 2011-11-07 – 2011-11-10 (×4): 400 mg via ORAL
  Filled 2011-11-07 (×5): qty 1

## 2011-11-07 MED ORDER — POTASSIUM CHLORIDE CRYS ER 10 MEQ PO TBCR
10.0000 meq | EXTENDED_RELEASE_TABLET | Freq: Two times a day (BID) | ORAL | Status: DC
  Administered 2011-11-07 – 2011-11-10 (×7): 10 meq via ORAL
  Filled 2011-11-07 (×8): qty 1

## 2011-11-07 NOTE — Progress Notes (Signed)
Subjective: Feeling feverish. She has continued to have hemoptysis - usually a small amount but one episode of significant amount of blood. Per SO oxygen saturation on RA yesterday was better - in the high 80's.  Objective: 2 D echo reviewed- severe right ventricle dilation, right atrium dilation and poor right heart function. No new labs   Physical Exam: Warm to the touch Chest- increase AP diameter, decreased breath sounds w/o rale,           Wheezes, no increased WOB or use of accessory muscles Cor- regular- heart sounds distant     Assessment/Plan: Pul/hemoptysis - continued hemoptysis and hypoxemia. NOw with recurrent fever.  Plan - lab: CBC           Restart Avelox 400 mg daily              Check and record RA oxygen at rest and active          Card - patient with right heart failure, dilated right heart. Further                  recommendations pending from cardiology    Plan - per cardiology re: mgt right heart failure  Dispo - for return to home when stable w/o hemoptysis     [name] 11/07/2011, 7:04 AM

## 2011-11-07 NOTE — Progress Notes (Signed)
Patient ambulated about 30 feet on room air, O2 sats 69% HR 120.

## 2011-11-07 NOTE — Progress Notes (Signed)
Subjective:  The patient has a history of Tetralogy of Fallot. She apparently had a Blalock procedure at 3 months followed by tetralogy repair at 2 years. We have none of these records. He was done at [facility]. She saw Dr. Clelia Croft for years but stopped seeing him about 9 years ago. She does mention that her last evaluation at age 25 and basically apparently included a heart catheterization and she was going to have a stent. I'm not clear on any details but do know that she said this wasn't done. She's had no followup echocardiograms.    She says she was doing relatively well until she got cold-like symptoms with coughing and sneezing about 10 days ago. She had some mild green sputum with blood streaking. She had a bad sneezing episode develop some pain under her right rib cage and then noticed some frank red blood with coughing. She has had this intermittently since then. She otherwise is feeling relatively well.  Overnight, she has continued to have scant hemoptysis.  Mostly blood tinged sputum.  No gross hemoptysis.  O2 sats are better   - 96 % currently on 2.5 lpm [place]     . FLUoxetine  20 mg Oral Daily  . moxifloxacin  400 mg Oral Q24H      Objective:  Vital Signs in the last 24 hours: Blood pressure 133/57, pulse 89, temperature 100.3 F (37.9 C), temperature source Oral, resp. rate 17, height 5' (1.524 m), weight 163 lb 5.8 oz (74.1 kg), last menstrual period 10/25/2011, SpO2 97.00%. Temp:  [98.1 F (36.7 C)-100.3 F (37.9 C)] 100.3 F (37.9 C) (11/13 0500) Pulse Rate:  [77-89] 89  (11/13 0500) Resp:  [16-17] 17  (11/13 0500) BP: (89-133)/(57-68) 133/57 mmHg (11/13 0500) SpO2:  [91 %-97 %] 97 % (11/13 0500)  Intake/Output from previous day:   Intake/Output from this shift:    Physical Exam: The patient is alert and oriented x 3.  The mood and affect are normal.   Skin: warm and dry.  Color is normal.    HEENT:   the sclera are nonicteric.  The mucous membranes are moist.  The  carotids are 2+ without bruits.  There is no thyromegaly.  There is no JVD.    Lungs: clear.  The chest wall is non tender.    Heart: regular rate with a normal S1 and S2.  There is a soft systolic murmur  Abdomen: good bowel sounds.  There is no guarding or rebound.  There is no hepatosplenomegaly or tenderness.  There are no masses.   Extremities:  no clubbing, cyanosis, or edema.  The legs are without rashes.  The distal pulses are intact.   Neuro:  Cranial nerves II - XII are intact.  Motor and sensory functions are intact.  Gait was not assessed.   Lab Results:  Basename 11/04/11 0825  WBC --  HGB 15.6*  PLT --   No results found for this basename: NA:2,K:2,CL:2,CO2:2,GLUCOSE:2,BUN:2,CREATININE:2 in the last 72 hours No results found for this basename: TROPONINI:2,CK,MB:2 in the last 72 hours No results found for this basename: BNP in the last 72 hours Hepatic Function Panel No results found for this basename: PROT,ALBUMIN,AST,ALT,ALKPHOS,BILITOT,BILIDIR,IBILI in the last 72 hours Lab Results  Component Value Date   CHOL  Value: 160        ATP III CLASSIFICATION:  <200     mg/dL   Desirable  098-119  mg/dL   Borderline High  >=147    mg/dL  High        07/24/2010   HDL 38* 07/24/2010   LDLCALC  Value: 105        Total Cholesterol/HDL:CHD Risk Coronary Heart Disease Risk Table                     Men   Women  1/2 Average Risk   3.4   3.3  Average Risk       5.0   4.4  2 X Average Risk   9.6   7.1  3 X Average Risk  23.4   11.0        Use the calculated Patient Ratio above and the CHD Risk Table to determine the patient's CHD Risk.        ATP III CLASSIFICATION (LDL):  <100     mg/dL   Optimal  161-096  mg/dL   Near or Above                    Optimal  130-159  mg/dL   Borderline  045-409  mg/dL   High  >811     mg/dL   Very High* 09/07/7828   TRIG 84 07/24/2010   CHOLHDL 4.2 07/24/2010   No results found for this basename: PROTIME in the last 72 hours  Echocardiogram:  Normal LV  function.  Dilated RV with severe reduction of RV function. PA pressure ~ 80 mmHg.  Assessment/Plan:   Hemoptysis, unspecified (11/01/2011)   Assessment: This has largely resolved.  Possible due to bronchitis with superimposed Tetralogy of Fallot Plan: She has developed a slight fever.  Avelox has been added.    TETRALOGY OF FALLOT, HX OF (08/01/2010)   Assessment: Will continue with current meds. Echo confirms presence of RV overload.    Will add Lasix 40 QD and KCl 10 qd.  BMP in AM     Vesta Mixer, Montez Hageman., MD, [facility] 11/07/2011, 7:41 AM

## 2011-11-08 LAB — BASIC METABOLIC PANEL
GFR calc Af Amer: >90 mL/min (ref >90)
GFR calc non Af Amer: >90 mL/min (ref >90)
Glucose, Bld: 120 mg/dL — ABNORMAL HIGH (ref 70–99)
Potassium: 4.8 mEq/L (ref 3.5–5.1)
Sodium: 138 mEq/L (ref 135–145)

## 2011-11-08 NOTE — Progress Notes (Signed)
Subjective: No hemoptysis last 24 hours.  DOE on ambulating  Objective: Vital signs in last 24 hours: Temp:  [97.4 F (36.3 C)-98.1 F (36.7 C)] 98.1 F (36.7 C) (11/14 0609) Pulse Rate:  [72-87] 87  (11/14 0609) Resp:  [17-22] 22  (11/14 0609) BP: (97-115)/(64-67) 115/67 mmHg (11/14 0609) SpO2:  [96 %-98 %] 98 % (11/14 0609) Weight change:  Last BM Date: 11/05/11  Intake/Output from previous day:   Intake/Output this shift:    General appearance: alert Resp: clear to auscultation bilaterally Cardio: S1, S2 normal and systolic murmur: systolic ejection 2/6, medium pitch at 2nd right intercostal space Extremities: extremities normal, atraumatic, no cyanosis or edema  Lab Results:  [facility] 11/07/11 0938  WBC 7.5  HGB 14.6  HCT 45.1  PLT 170   BMET  Basename 11/08/11 0555  NA 138  K 4.8  CL 94*  CO2 36*  GLUCOSE 120*  BUN 17  CREATININE 0.85  CALCIUM 10.1    Studies/Results: No results found.  Medications: I have reviewed the patient's current medications.  Assessment/Plan: Principal Problem:  *Hemoptysis, unspecified, acute bronchitis      Avelox day 2.  Can D/C home if no hemoptysis next 24 hrs Active Problems:  ASTHMA      Stable  TETRALOGY OF FALLOT, HX       Lasix started.  Will need home o2 Disposition:  Possible Discharge next 24 hours.  No insurance.  SW consult   LOS: 8 days   Tuwana Kapaun JOSEPH 11/08/2011, 7:39 AM

## 2011-11-09 LAB — BASIC METABOLIC PANEL
Chloride: 89 mEq/L — ABNORMAL LOW (ref 96–112)
GFR calc Af Amer: >90 mL/min (ref >90)
Potassium: 4.6 mEq/L (ref 3.5–5.1)

## 2011-11-09 MED ORDER — PROMETHAZINE-CODEINE 6.25-10 MG/5ML PO SYRP: 5.0000 mL | ORAL_SOLUTION | ORAL | Status: DC | PRN

## 2011-11-09 MED ORDER — POTASSIUM CHLORIDE CRYS ER 10 MEQ PO TBCR: 10.0000 meq | EXTENDED_RELEASE_TABLET | Freq: Every day | ORAL | Status: DC

## 2011-11-09 NOTE — Progress Notes (Signed)
Noted RN progress note re O2 sats and O2 flow requirement. Have asked [facility] to assist with home O2.  Annamarie Major Zebediah Beezley RN MPH [phone]

## 2011-11-09 NOTE — Progress Notes (Signed)
Patient currently satting at 85% on room air, at rest. Previously desatted to 60"s with ambulation. Patient placed on 3 liters O2 via [place], now satting 96%. Will continue to monitor.

## 2011-11-09 NOTE — Discharge Summary (Signed)
Physician Discharge Summary  Patient ID: [name] MRN: [id number] DOB/AGE: 20-May-1986 25 y.o.  Admit date: 10/31/2011 Discharge date: 11/09/2011  Admission Diagnoses:  Discharge Diagnoses:  Principal Problem:  *Hemoptysis, unspecified Active Problems:  TETRALOGY OF FALLOT, HX OF   Discharged Condition: good  Hospital Course: the patient continued to have hemoptysis and therefore was kept in the hospital. She was seen by the pulmonary service recommended discontinuing antibiotics. An echocardiogram was done and showed decreased RV function and severe pulmonary hypertension. Lasix was added to the patient's regimen. About 3 days prior to discharge the patient again developed some low-grade fever and continued to have hemoptysis and increased cough. Avelox was restarted. Her hemoptysis improved has not recurred her last 48 hours. She remained afebrile. Her oxygen saturation room air was in the 80s and with ambulation dropped into the high 60s so she has been placed on home oxygen 3 L nasal cannula. She will follow with Dr. Debby Bud and Dr. Tenny Craw  Consults: cardiology and pulmonary/intensive care  Significant Diagnostic Studies: labs:   Treatments: antibiotics: avelox, analgesia: acetaminophen and cardiac meds: furosemide  Discharge Exam: Blood pressure 106/83, pulse 76, temperature 98.2 F (36.8 C), temperature source Oral, resp. rate 18, height 5' (1.524 m), weight 74.1 kg (163 lb 5.8 oz), last menstrual period 10/25/2011, SpO2 93.00%. Resp: clear to auscultation bilaterally  Disposition:   Discharge Orders    Future Orders Please Complete By Expires   Diet - low sodium heart healthy      Increase activity slowly      Discharge instructions      Comments:   Take antibiotics as prescribed. Take Robitussin DM or equivalent as needed. For uncontrolled fever, increased shortness of breath, recurrent hemoptysis - call. You will be scheduled for office follow-up Nov 15. Will at  that time make referral to Dr. Dietrich Pates for cardiology     Current Discharge Medication List    START taking these medications   Details  guaiFENesin-dextromethorphan (ROBITUSSIN DM) 100-10 MG/5ML syrup Take 5 mLs by mouth 4 (four) times daily as needed for cough. Qty: 240 mL, Refills: 0    moxifloxacin (AVELOX) 400 MG tablet Take 1 tablet (400 mg total) by mouth daily. Qty: 10 tablet, Refills: 0      CONTINUE these medications which have NOT CHANGED   Details  ALPRAZolam (XANAX) 0.25 MG tablet TAKE ONE TO TWO TABLETS BY MOUTH EVERY DAY AS NEEDED Qty: 60 tablet, Refills: 5    FLUoxetine (PROZAC) 20 MG capsule Take 20 mg by mouth daily.        STOP taking these medications     guaiFENesin (ROBITUSSIN) 100 MG/5ML SOLN      guaifenesin (ROBITUSSIN) 100 MG/5ML syrup          Signed: Laniece Hornbaker JOSEPH 11/09/2011, 6:05 PM

## 2011-11-09 NOTE — Progress Notes (Signed)
Received order for home O2, however no documentation of O2 sats on room air and liter flow needed to bring O2 sat up to > 90%. Message left for Pt RN to please obtain this information. Will setup HH oxygen as soon as qualifying information available. Pt is registered as self-pay, will ask [facility] to assist pt with home oxygen.  CRoyal RN  [facility] [phone]

## 2011-11-09 NOTE — Progress Notes (Signed)
Subjective: No further hemoptysis.  Ambulated on floor  Objective: Vital signs in last 24 hours: Temp:  [97.6 F (36.4 C)-99.3 F (37.4 C)] 99.3 F (37.4 C) (11/15 0618) Pulse Rate:  [85-97] 97  (11/15 0618) Resp:  [18-24] 24  (11/15 0618) BP: (106-123)/(47-73) 123/47 mmHg (11/15 0618) SpO2:  [92 %-93 %] 92 % (11/15 0618) Weight change:  Last BM Date: 11/08/11  Intake/Output from previous day: 11/14 0701 - 11/15 0700 In: 600 [P.O.:600] Out: -  Intake/Output this shift:    General appearance: alert, cooperative and no distress Resp: clear to auscultation bilaterally Cardio: S1, S2 normal and systolic murmur: systolic ejection 2/6, medium pitch at 2nd right intercostal space Extremities: extremities normal, atraumatic, no cyanosis or edema  Lab Results:  [facility] 11/07/11 0938  WBC 7.5  HGB 14.6  HCT 45.1  PLT 170   BMET  Basename 11/08/11 0555  NA 138  K 4.8  CL 94*  CO2 36*  GLUCOSE 120*  BUN 17  CREATININE 0.85  CALCIUM 10.1    Studies/Results: No results found.  Medications: I have reviewed the patient's current medications.  Assessment/Plan: Principal Problem:  *Hemoptysis, unspecified      avelox 3, hemoptysis resolving Active Problems:  TETRALOGY OF FALLOT, HX OF      Lasix. SW consult today.  Will need home oxygen.  Plan to discharge 11/16   LOS: 9 days   Analia Zuk JOSEPH 11/09/2011, 7:34 AM

## 2011-11-10 LAB — BASIC METABOLIC PANEL
CO2: 31 mEq/L (ref 19–32)
Chloride: 92 mEq/L — ABNORMAL LOW (ref 96–112)
Potassium: 4.6 mEq/L (ref 3.5–5.1)
Sodium: 136 mEq/L (ref 135–145)

## 2011-11-10 MED ORDER — FUROSEMIDE 40 MG PO TABS: 40.0000 mg | ORAL_TABLET | Freq: Every day | ORAL | Status: DC

## 2011-11-10 MED ORDER — MOXIFLOXACIN HCL 400 MG PO TABS: 400.0000 mg | ORAL_TABLET | ORAL | Status: AC

## 2011-11-10 NOTE — Progress Notes (Signed)
LATE ENTRY: Clinical Social Worker received consult on 11/08/11 regarding D/C planning - pt. needs home oxygen - no insurance. CSW made contact with RN case manager C. Royal regarding HH needs. CSW signing off.  Genelle Bal, MSW, LCSW [phone]

## 2011-11-17 ENCOUNTER — Telehealth: Payer: Self-pay | Admitting: *Deleted

## 2011-11-17 NOTE — Telephone Encounter (Signed)
Pt just got out of the hospital, she was started on Lasix, Klor-con,  Avelox. Pt states since she started on these meds she has been experiencing a rash, she states the rash is not itching its just really red. She stated it started on her face and abdomin and had now moved down to her legs. What do you advise in Dr Debby Bud absence

## 2011-11-17 NOTE — Telephone Encounter (Signed)
Was being treated for bronchiectasis. OK to stop the Avelox. Any more hemoptysis? Does she have a follow-up OV scheduled? Does she have an appointment with Dr. Dietrich Pates, heartcare?

## 2011-11-17 NOTE — Telephone Encounter (Signed)
Last documentaion currently on epic is nov 14 progress note indicating taking avelox for bronchitis (started nov 13)  IF the rash is med related, it would seem to be most likely due to the avelox  OK to stop the avelox  She may be able to simply stop the antibx and not take further antibx;  Please ask pt the purpose of the antibx, and how many more days she is supposed to take

## 2011-11-17 NOTE — Telephone Encounter (Signed)
Called [name] back with no answer and no option to leave a message. Pt has appointment with you on Nov 21, 2011 ( Next Tues). Will try pt back before I leave today

## 2011-11-17 NOTE — Telephone Encounter (Signed)
Spoke with pts boyfriend [name]), he states Pt has 4 more days on Avelox. I informed him per  Dr Jonny Ruiz he advised pt to stop Avelox. I also informed pt's spouse that I would get this message back to Dr Debby Bud seeing that he is the one that rounded on her in the hospital and is more familiar with her situation.

## 2011-11-21 ENCOUNTER — Encounter: Payer: Self-pay | Admitting: Internal Medicine

## 2011-11-21 ENCOUNTER — Ambulatory Visit (INDEPENDENT_AMBULATORY_CARE_PROVIDER_SITE_OTHER): Payer: Self-pay | Admitting: Internal Medicine

## 2011-11-21 DIAGNOSIS — I50810 Right heart failure, unspecified: Secondary | ICD-10-CM

## 2011-11-21 DIAGNOSIS — I509 Heart failure, unspecified: Secondary | ICD-10-CM

## 2011-11-21 DIAGNOSIS — I2789 Other specified pulmonary heart diseases: Secondary | ICD-10-CM

## 2011-11-21 MED ORDER — HYDROCODONE-ACETAMINOPHEN 5-325 MG PO TABS: 1.0000 | ORAL_TABLET | Freq: Four times a day (QID) | ORAL | Status: AC | PRN

## 2011-11-21 NOTE — Patient Instructions (Signed)
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 Glad you are doing better on oxygen. You have an appointment with Dr. Tenny Craw December 20th at 12:00 noon.   You have a good case for disability - go ahead and make application. We will help all we can.   Handicap form done.

## 2011-11-23 DIAGNOSIS — I50810 Right heart failure, unspecified: Secondary | ICD-10-CM | POA: Insufficient documentation

## 2011-11-23 NOTE — Assessment & Plan Note (Signed)
Patient is stable post- hospital. She has adequate oxygenation on supplemental oxygen.  Plan - she is to see Dr. Dietrich Pates Dec 20th            Continue O2 and meds.

## 2011-11-23 NOTE — Progress Notes (Signed)
  Subjective:    Patient ID: [name], female    DOB: Feb 25, 1986, 25 y.o.   MRN: [id number]  HPI [name] presents for hospital follow-up. Records reviewed. She was admitted for hemoptysis. Question of whether this was due to bronchitis vs alveolar hemorrhage due to severe right heart failure and pulmonary hypertension. She was d/c home on oxygen due to desats to 70's, due to right heart failure.   Since being home she has finished antibiotics. There has not been any recurrent hemoptysis. She does continue to use oxygen and this helps. She is otherwise doing OK.  I have reviewed the patient's medical history in detail and updated the computerized patient record.    Review of Systems System review is negative for any constitutional, cardiac, pulmonary, GI or neuro symptoms or complaints other than as described in the HPI.     Objective:   Physical Exam Vitals reviewed Gen'l - pleasant white woman in no distress HEENT - C&S clear Cor - RRR Pulm - on oxygen with O2 sat 92%. No increased WOB. Good color w/o acray cyanosis       Assessment & Plan:

## 2011-12-14 ENCOUNTER — Encounter: Payer: Self-pay | Admitting: Internal Medicine

## 2011-12-14 ENCOUNTER — Ambulatory Visit (INDEPENDENT_AMBULATORY_CARE_PROVIDER_SITE_OTHER): Payer: Self-pay | Admitting: Internal Medicine

## 2011-12-14 DIAGNOSIS — I2789 Other specified pulmonary heart diseases: Secondary | ICD-10-CM

## 2011-12-14 DIAGNOSIS — I272 Pulmonary hypertension, unspecified: Secondary | ICD-10-CM

## 2011-12-14 DIAGNOSIS — Q213 Tetralogy of Fallot: Secondary | ICD-10-CM

## 2011-12-14 DIAGNOSIS — Z8679 Personal history of other diseases of the circulatory system: Secondary | ICD-10-CM

## 2011-12-14 LAB — BASIC METABOLIC PANEL
CO2: 31 mEq/L (ref 19–32)
Glucose, Bld: 79 mg/dL (ref 70–99)
Potassium: 4.3 mEq/L (ref 3.5–5.1)
Sodium: 141 mEq/L (ref 135–145)

## 2011-12-14 LAB — CBC WITH DIFFERENTIAL/PLATELET
Basophils Absolute: 0 10*3/uL (ref 0.0–0.1)
Eosinophils Absolute: 0.2 10*3/uL (ref 0.0–0.7)
Lymphocytes Relative: 28.7 % (ref 12.0–46.0)
MCHC: 34.3 g/dL (ref 30.0–36.0)
MCV: 90.6 fl (ref 78.0–100.0)
Monocytes Absolute: 0.8 10*3/uL (ref 0.1–1.0)
Neutro Abs: 4.5 10*3/uL (ref 1.4–7.7)
Neutrophils Relative %: 58.1 % (ref 43.0–77.0)
RDW: 15.3 % — ABNORMAL HIGH (ref 11.5–14.6)

## 2011-12-14 NOTE — Progress Notes (Addendum)
HPI Patient is a 25 year old with a history of Tetralogy of Fallot (s/p modified Blalock-Taussig shunt 3 mon; s/p ligation of shunt and  repair of  RV outflow tract at age 13 1/2 years)  She was previously followed by Larinda Buttery.  Last seen in 2004.   She underwent cardiac catheterization at [facility]. RA mean pressure 15; PAP 50/10.  There was no flow in the L pulmonary artery.   Left lung was perfused primarily by bronchial collaterals.  Signif PI due to patch across RVOT.     She was  hospitalized for hemoptysis.  She reports coughing up "cups" of blood. Echo in November showed LVEF 60%.  RV was severely dilated with moderate to severe dysfunction.  No signif pulmonic stenosis.  PAP estimated at 80 mm Hg  Since d/c she has not had further hemoptysis She denies syncope or presyncope.  No palpitations.  Occasional chest pressure.  Does get short of breath with activity.  Lips get a little dusky, she sits down.  Improves.  Takes activities as tolerated.  Has noticed a decline over the past year  Allergies  Allergen Reactions  . Avelox (Moxifloxacin Hcl In Nacl)     Hives itching    Current Outpatient Prescriptions  Medication Sig Dispense Refill  . ALPRAZolam (XANAX) 0.25 MG tablet TAKE ONE TO TWO TABLETS BY MOUTH EVERY DAY AS NEEDED  60 tablet  5  . FLUoxetine (PROZAC) 20 MG capsule Take 20 mg by mouth daily.        . furosemide (LASIX) 40 MG tablet Take 1 tablet (40 mg total) by mouth daily.  30 tablet  12  . HYDROcodone-acetaminophen (NORCO) 5-325 MG per tablet Take 1 tablet by mouth every 6 (six) hours as needed.        . potassium chloride (K-DUR,KLOR-CON) 10 MEQ tablet Take 1 tablet (10 mEq total) by mouth daily.  30 tablet  11    Past Medical History  Diagnosis Date  . Back pain   . GERD (gastroesophageal reflux disease)   . Asthma     As a child  . Scoliosis deformity of spine   . Anxiety   . Tetralogy of Fallot     Past Surgical History  Procedure Date  . Tetralogy of fallot  repair     Age 63 at [facility]  . Blalock procedure     3 months  . Back surgery     Rods for scoliosis    Family History  Problem Relation Age of Onset  . Diabetes Mother   . Hypertension Mother   . Cancer Mother     uterine cancer  . Alcohol abuse Father   . Cancer Maternal Aunt     Breast Cancer, Lung cancer    History   Social History  . Marital Status: Single    Spouse Name: N/A    Number of Children: 0  . Years of Education: N/A   Occupational History  . Not on file.   Social History Main Topics  . Smoking status: Former Smoker -- 1.5 packs/day for 10 years    Types: Cigarettes    Quit date: 02/02/2011  . Smokeless tobacco: Not on file  . Alcohol Use: Yes  . Drug Use: No  . Sexually Active: Not on file   Other Topics Concern  . Not on file   Social History Narrative   Unemployed at present.  Worked as Conservation officer, nature at [employer]- a little bitSingle, in  a 4 year relationshipNo children UnemployedNo history of abuse    Review of Systems:  All systems reviewed.  They are negative to the above problem except as previously stated.  Vital Signs: BP 118/70  Pulse 80  Ht 5' (1.524 m)  Wt 162 lb (73.483 kg)  BMI 31.64 kg/m2  Physical Exam Patinet is 25 year old on O2 in NAD. 6 min walk:  785 ft.  Sats 96% at rest.  BP 112/59.  HR71.;  With exertion O2 Sat decrease to 81%; HR increased to 131 bpm.   (patient on 3L O2 throughout)  O2 sat recovered at approx 3 to 4 min to 96^.  HEENT:  Normocephalic, atraumatic. EOMI, PERRLA.  Neck: JVP is normal. No thyromegaly. No bruits.  Lungs: clear to auscultation. No rales no wheezes.  Heart: Regular rate and rhythm. Normal S1, S2. No S3.   Gr III/VI systolic murmur L sternal border.  Pos RV heave.  Abdomen:  Supple, nontender. Normal bowel sounds. No masses. No hepatomegaly.  Extremities:   Good distal pulses throughout except for L radial (cutdown).  No lower extremity edema. No clubbing. Musculoskeletal  :moving all extremities.  Neuro:   alert and oriented x3.  CN II-XII grossly intact.  EKG:  Sinus rhythm.  69 bpm.  RA abnormality.  qR in V1  T wave inversion V1, V2.    Assessment and Plan:

## 2011-12-14 NOTE — Patient Instructions (Signed)
Your physician recommends that you have lab work today: BMP and CBC  Your physician recommends that you continue on your current medications as directed. Please refer to the Current Medication list given to you today.

## 2011-12-15 NOTE — Assessment & Plan Note (Signed)
Patient now with severe pulmonary hypertension and hypoxia.  Recent admission for hemoptysis. On exam, volume does not appear bad.  Sats OK at rest.   Will repeat CBC as well as electrolytes (Lasix and KCL are new prescriptions for her). Will review with Serita Grit ([facility]) re further evaluation/Rx. For now I will make no changes in her regimen.  Continue activities as tolerated.

## 2012-01-22 ENCOUNTER — Telehealth: Payer: Self-pay | Admitting: *Deleted

## 2012-01-22 NOTE — Telephone Encounter (Signed)
Called patient to ask if she has received a call from the [facility]. She has not heard from them yet. Advised that Dr. Tenny Craw will follow up with this.

## 2012-01-23 ENCOUNTER — Telehealth: Payer: Self-pay | Admitting: *Deleted

## 2012-01-23 NOTE — Telephone Encounter (Signed)
Caller states that Hydrocodone-APAP 5.325 is too expensive and request new Rx for Vicodin 7.5-500 [so that patient can take less tabs daily] and for cost effectiveness. Please refill.

## 2012-01-23 NOTE — Telephone Encounter (Signed)
Ok for vicodin 7.5/500 qid prn, #120, 1 refill

## 2012-01-24 MED ORDER — HYDROCODONE-ACETAMINOPHEN 7.5-500 MG PO TABS: 1.0000 | ORAL_TABLET | Freq: Four times a day (QID) | ORAL | Status: AC | PRN

## 2012-01-24 NOTE — Telephone Encounter (Signed)
Called patient and spoke to her fiance (she was in the rest room) He is aware that Dr.Ross spoke with the [facility] and they will call her in the next 2 days for an appointment.

## 2012-01-24 NOTE — Telephone Encounter (Signed)
Patient informed. Rx Done. 

## 2012-02-15 ENCOUNTER — Telehealth: Payer: Self-pay | Admitting: *Deleted

## 2012-02-15 NOTE — Telephone Encounter (Addendum)
Dr.Ross received a message from the [facility] asking if we could help arrange for patient to have medicaid benefits. She has an appointment with them on 03/05/2012. I called her fiance (she was asleep) and he advised me that they were told that she has to have social security disability approved prior to getting medicaid He states that he will go back to the [facility] office to make sure that the process is being worked thru. I advised him that they have not requested that her records be sent for review yet. Will let Dr.Ross know.

## 2012-03-08 ENCOUNTER — Other Ambulatory Visit: Payer: Self-pay | Admitting: Internal Medicine

## 2012-03-11 DIAGNOSIS — Z0271 Encounter for disability determination: Secondary | ICD-10-CM

## 2012-03-18 ENCOUNTER — Other Ambulatory Visit: Payer: Self-pay | Admitting: Internal Medicine

## 2012-03-19 NOTE — Telephone Encounter (Signed)
Hydrocodone-APAP request [last refill 11.27.12 #[id number]]

## 2012-03-25 ENCOUNTER — Other Ambulatory Visit: Payer: Self-pay | Admitting: Internal Medicine

## 2012-03-25 NOTE — Telephone Encounter (Signed)
Alprazolam request [last refill 09.25.12 #[id number]]

## 2012-04-13 ENCOUNTER — Encounter (HOSPITAL_COMMUNITY): Payer: Self-pay | Admitting: *Deleted

## 2012-04-13 ENCOUNTER — Emergency Department (HOSPITAL_COMMUNITY): Payer: Medicaid Other

## 2012-04-13 ENCOUNTER — Emergency Department (HOSPITAL_COMMUNITY)
Admission: EM | Admit: 2012-04-13 | Discharge: 2012-04-13 | Disposition: A | Discharge status: Home / Self Care | Payer: Medicaid Other | Attending: Emergency Medicine | Admitting: Emergency Medicine

## 2012-04-13 DIAGNOSIS — Z87891 Personal history of nicotine dependence: Secondary | ICD-10-CM | POA: Insufficient documentation

## 2012-04-13 DIAGNOSIS — J069 Acute upper respiratory infection, unspecified: Secondary | ICD-10-CM | POA: Insufficient documentation

## 2012-04-13 DIAGNOSIS — M4120 Other idiopathic scoliosis, site unspecified: Secondary | ICD-10-CM | POA: Insufficient documentation

## 2012-04-13 DIAGNOSIS — R042 Hemoptysis: Secondary | ICD-10-CM | POA: Insufficient documentation

## 2012-04-13 DIAGNOSIS — R059 Cough, unspecified: Secondary | ICD-10-CM | POA: Insufficient documentation

## 2012-04-13 DIAGNOSIS — R05 Cough: Secondary | ICD-10-CM | POA: Insufficient documentation

## 2012-04-13 MED ORDER — AZITHROMYCIN 250 MG PO TABS: 250.0000 mg | ORAL_TABLET | Freq: Every day | ORAL | Status: DC

## 2012-04-13 MED ORDER — AZITHROMYCIN 250 MG PO TABS: ORAL_TABLET | ORAL | Status: AC

## 2012-04-13 MED ORDER — HYDROCODONE-ACETAMINOPHEN 5-325 MG PO TABS
1.0000 | ORAL_TABLET | Freq: Once | ORAL | Status: AC
  Administered 2012-04-13: 1 via ORAL
  Filled 2012-04-13: qty 1

## 2012-04-13 MED ORDER — BENZONATATE 100 MG PO CAPS: 100.0000 mg | ORAL_CAPSULE | Freq: Three times a day (TID) | ORAL | Status: AC

## 2012-04-13 NOTE — ED Notes (Signed)
rx called into [facility] on [address] at [phone] due to [facility] not getting the prescription before it closed

## 2012-04-13 NOTE — Discharge Instructions (Signed)
Ms Kiehn chest x-ray today shows no cause for your hemoptysis or blood in your sputum. We will put you on a Z-Pak and she has been coughing for over 7 days. Followup with Dr. Arthur Holms on Monday. Return if you start having increased hemoptysis like you had in November when you were admitted. He turned to the ER for severe shortness of breath chest pain, or any other concerns. You may try benadryl at night to help your cough from the post nasal drip.  The tessalon pearls are also for the cough.   Cough, Adult  A cough is a reflex. It helps you clear your throat and airways. A cough can help heal your body. A cough can last 2 or 3 weeks (acute) or may last more than 8 weeks (chronic). Some common causes of a cough can include an infection, allergy, or a cold. HOME CARE  Only take medicine as told by your doctor.   If given, take your medicines (antibiotics) as told. Finish them even if you start to feel better.   Use a cold steam vaporizer or humidier in your home. This can help loosen thick spit (secretions).   Sleep so you are almost sitting up (semi-upright). Use pillows to do this. This helps reduce coughing.   Rest as needed.   Stop smoking if you smoke.  GET HELP RIGHT AWAY IF:  You have yellowish-white fluid (pus) in your thick spit.   Your cough gets worse.   Your medicine does not reduce coughing, and you are losing sleep.   You cough up blood.   You have trouble breathing.   Your pain gets worse and medicine does not help.   You have a fever.  MAKE SURE YOU:   Understand these instructions.   Will watch your condition.   Will get help right away if you are not doing well or get worse.  Document Released: 08/24/2011 Document Revised: 11/30/2011 Document Reviewed: 08/24/2011 [facility] Patient Information 2012 [place], [place].Cough, Adult  A cough is a reflex. It helps you clear your throat and airways. A cough can help heal your body. A cough can last 2 or 3 weeks  (acute) or may last more than 8 weeks (chronic). Some common causes of a cough can include an infection, allergy, or a cold. HOME CARE  Only take medicine as told by your doctor.   If given, take your medicines (antibiotics) as told. Finish them even if you start to feel better.   Use a cold steam vaporizer or humidier in your home. This can help loosen thick spit (secretions).   Sleep so you are almost sitting up (semi-upright). Use pillows to do this. This helps reduce coughing.   Rest as needed.   Stop smoking if you smoke.  GET HELP RIGHT AWAY IF:  You have yellowish-white fluid (pus) in your thick spit.   Your cough gets worse.   Your medicine does not reduce coughing, and you are losing sleep.   You cough up blood.   You have trouble breathing.   Your pain gets worse and medicine does not help.   You have a fever.  MAKE SURE YOU:   Understand these instructions.   Will watch your condition.   Will get help right away if you are not doing well or get worse.  Document Released: 08/24/2011 Document Revised: 11/30/2011 Document Reviewed: 08/24/2011 [facility] [place] Patient Information 2012 [place], [place].Hemoptysis Hemoptysis means coughing up blood from some part of the respiratory tract. The respiratory  tract includes the nose, mouth, throat, airway passages, and lungs. You should always contact a caregiver if you develop hemoptysis. This is important as even mild cases of hemoptysis may lead to serious breathing or bleeding problems. Major bleeding from the airway is considered a medical emergency, and needs to be evaluated and managed promptly to avoid complications, disability, or death. Hemoptysis may reoccur from time to time. CAUSES  In some cases, the cause of hemoptysis is not known. Causes may include:  A ruptured blood vessel caused by coughing or an infection.   Bronchiectasis. Bronchiectasis is a long-standing infection of the small air passageways.   A blood  clot in the lungs (pulmonary embolism).   Pneumonia.   Tuberculosis.   Breathing in a small foreign object.   Cancer.  DIAGNOSIS  Diagnosing the cause of hemoptysis is important. The most common cause of hemoptysis is a ruptured blood vessel caused by coughing or a mild infection. This is normally no cause for concern. Diagnosing the cause for hemoptysis is based on your history, symptoms, physical examination, and diagnostic tests. Tests for diagnosing the cause of hemoptysis may include:  Blood samples.   A chest X-ray.   A computerized X-ray scan (CT scan or CAT scan).   Bronchoscopy. This test uses a flexible tube (a bronchoscope) to see inside the lungs.   Pulmonary angiography. Pulmonary angiography is an X-ray procedure that looks at the vessels in the lungs. Angiography produces a picture called an angiogram. It requires injecting a dye into the blood vessels.  TREATMENT   Treatment for hemoptysis depends on the cause. It also depends on the quantity of blood. Infrequent, mild hemoptysis usually does not require specific, immediate treatment.   If the cause of hemoptysis is unknown, treatment may involve monitoring for at least 2 or 3 years. If you have a normal chest X-ray and bronchoscopy, the hemoptysis usually clears within 6 months.   Treatment of a pneumonia, chronic bronchiectasis, or tuberculosis usually involves medicines to fight the infections.   For lung cancers, treatment depends on the stage of the cancer.   Major bleeding is a medical emergency. Steps are usually taken to find the source and stop the bleeding.   Some methods of controlling active moderate to severe bleeding include:   Surgical removal (resection). Tissue causing the hemoptysis is removed.   Bronchoscopic laser therapy. During a bronchoscopy, a laser is used to remove tumors and lesions or widen airways.   Bronchial artery embolization. This procedure involves injecting substances into the  bloodstream to stop blood flow.  SEEK IMMEDIATE MEDICAL CARE IF:   You begin to cough up large amounts of blood.   You develop problems with your breathing.   You begin vomiting blood or see blood in your stool.   Develop chest pain.   Feel faint or pass out.   You develop a fever over 102 F (38.9 C), or as your caregiver suggests.  Document Released: 02/19/2002 Document Revised: 11/30/2011 Document Reviewed: 04/26/2010 [facility] Patient Information 2012 [place], [place].Hemoptysis Hemoptysis means coughing up blood from some part of the respiratory tract. The respiratory tract includes the nose, mouth, throat, airway passages, and lungs. You should always contact a caregiver if you develop hemoptysis. This is important as even mild cases of hemoptysis may lead to serious breathing or bleeding problems. Major bleeding from the airway is considered a medical emergency, and needs to be evaluated and managed promptly to avoid complications, disability, or death. Hemoptysis may reoccur from  time to time. CAUSES  In some cases, the cause of hemoptysis is not known. Causes may include:  A ruptured blood vessel caused by coughing or an infection.   Bronchiectasis. Bronchiectasis is a long-standing infection of the small air passageways.   A blood clot in the lungs (pulmonary embolism).   Pneumonia.   Tuberculosis.   Breathing in a small foreign object.   Cancer.  DIAGNOSIS  Diagnosing the cause of hemoptysis is important. The most common cause of hemoptysis is a ruptured blood vessel caused by coughing or a mild infection. This is normally no cause for concern. Diagnosing the cause for hemoptysis is based on your history, symptoms, physical examination, and diagnostic tests. Tests for diagnosing the cause of hemoptysis may include:  Blood samples.   A chest X-ray.   A computerized X-ray scan (CT scan or CAT scan).   Bronchoscopy. This test uses a flexible tube (a bronchoscope) to  see inside the lungs.   Pulmonary angiography. Pulmonary angiography is an X-ray procedure that looks at the vessels in the lungs. Angiography produces a picture called an angiogram. It requires injecting a dye into the blood vessels.  TREATMENT   Treatment for hemoptysis depends on the cause. It also depends on the quantity of blood. Infrequent, mild hemoptysis usually does not require specific, immediate treatment.   If the cause of hemoptysis is unknown, treatment may involve monitoring for at least 2 or 3 years. If you have a normal chest X-ray and bronchoscopy, the hemoptysis usually clears within 6 months.   Treatment of a pneumonia, chronic bronchiectasis, or tuberculosis usually involves medicines to fight the infections.   For lung cancers, treatment depends on the stage of the cancer.   Major bleeding is a medical emergency. Steps are usually taken to find the source and stop the bleeding.   Some methods of controlling active moderate to severe bleeding include:   Surgical removal (resection). Tissue causing the hemoptysis is removed.   Bronchoscopic laser therapy. During a bronchoscopy, a laser is used to remove tumors and lesions or widen airways.   Bronchial artery embolization. This procedure involves injecting substances into the bloodstream to stop blood flow.  SEEK IMMEDIATE MEDICAL CARE IF:   You begin to cough up large amounts of blood.   You develop problems with your breathing.   You begin vomiting blood or see blood in your stool.   Develop chest pain.   Feel faint or pass out.   You develop a fever over 102 F (38.9 C), or as your caregiver suggests.  Document Released: 02/19/2002 Document Revised: 11/30/2011 Document Reviewed: 04/26/2010 [facility] Patient Information 2012 [place], [place].

## 2012-04-13 NOTE — ED Notes (Signed)
PT reports a HX of pulmonary hypertension. She is worried about the episode of coughing up blood  Ten times since last night.

## 2012-04-13 NOTE — ED Notes (Signed)
Patient moved into Pod A #6 from x-ray.

## 2012-04-14 NOTE — ED Provider Notes (Signed)
History     CSN: [id number]  Arrival date & time 04/13/12  1303   First MD Initiated Contact with Patient 04/13/12 1536      Chief Complaint  Patient presents with  . Hemoptysis    (Consider location/radiation/quality/duration/timing/severity/associated sxs/prior treatment) Patient is a 26 y.o. female presenting with cough. The history is provided by the patient and a relative. No language interpreter was used.  Cough This is a recurrent problem. The current episode started 12 to 24 hours ago. The problem occurs every few hours. The problem has not changed since onset.The cough is productive of sputum. There has been no fever. Pertinent negatives include no chest pain, no chills, no sweats, no ear pain, no rhinorrhea, no sore throat, no shortness of breath and no wheezing. She has tried nothing for the symptoms. The treatment provided no relief. She is not a smoker. Her past medical history is significant for asthma. Her past medical history does not include bronchitis, pneumonia, COPD or emphysema.   26 year old female coming in today with hemoptysis x12 hours approximately 5-10 times. States that she's coughing up less than a teaspoon each time. She's also had a productive cough of green sputum for almost 7 days now. Patient has a past medical history of tetralogy of flow scoliosis, anxiety, asthma, GERD, pulmonary hypertension chronic back pain with hardware in her back from scoliosis. She was admitted in November for hemoptysis but she was coughing over a cup each time. Her pcp is dr  Arthur Holms she goes to [facility] for the heart checkups. Nontoxic appearing in no acute distress noted.  Past Medical History  Diagnosis Date  . Back pain   . GERD (gastroesophageal reflux disease)   . Asthma     As a child  . Scoliosis deformity of spine   . Anxiety   . Tetralogy of Fallot     Past Surgical History  Procedure Date  . Tetralogy of fallot repair     Age 50 at [facility]  . Blalock procedure    3 months  . Back surgery     Rods for scoliosis    Family History  Problem Relation Age of Onset  . Diabetes Mother   . Hypertension Mother   . Cancer Mother     uterine cancer  . Alcohol abuse Father   . Cancer Maternal Aunt     Breast Cancer, Lung cancer    History  Substance Use Topics  . Smoking status: Former Smoker -- 1.5 packs/day for 10 years    Types: Cigarettes    Quit date: 02/02/2011  . Smokeless tobacco: Not on file  . Alcohol Use: Yes    OB History    Grav Para Term Preterm Abortions TAB SAB Ect Mult Living                  Review of Systems  Constitutional: Negative.  Negative for chills.  HENT: Negative.  Negative for ear pain, sore throat and rhinorrhea.   Eyes: Negative.   Respiratory: Positive for cough. Negative for shortness of breath and wheezing.   Cardiovascular: Negative.  Negative for chest pain.  Gastrointestinal: Negative.   Neurological: Negative.   Psychiatric/Behavioral: Negative.   All other systems reviewed and are negative.    Allergies  Avelox  Home Medications   Current Outpatient Rx  Name Route Sig Dispense Refill  . ALPRAZOLAM 0.25 MG PO TABS Oral Take 0.25-0.5 mg by mouth daily as needed. For anxiety.    Marland Kitchen  FLUOXETINE HCL 20 MG PO CAPS Oral Take 20 mg by mouth every morning.    . FUROSEMIDE 40 MG PO TABS Oral Take 40 mg by mouth daily.    Marland Kitchen HYDROCODONE-ACETAMINOPHEN 7.5-500 MG PO TABS Oral Take 1 tablet by mouth every 6 (six) hours as needed. For pain.    Marland Kitchen POTASSIUM CHLORIDE ER 10 MEQ PO TBCR Oral Take 10 mEq by mouth daily.    . AZITHROMYCIN 250 MG PO TABS  Take 2 pills day one and one pill days 2-5 6 each 0  . BENZONATATE 100 MG PO CAPS Oral Take 1 capsule (100 mg total) by mouth every 8 (eight) hours. 21 capsule 0    BP 122/56  Pulse 72  Temp(Src) 98.1 F (36.7 C) (Oral)  Resp 18  SpO2 98%  LMP 03/13/2012  Physical Exam  Nursing note and vitals reviewed. Constitutional: She is oriented to person, place,  and time. She appears well-developed and well-nourished.  HENT:  Head: Normocephalic and atraumatic.  Eyes: Conjunctivae and EOM are normal. Pupils are equal, round, and reactive to light.  Neck: Normal range of motion. Neck supple.  Cardiovascular: Normal rate.   Pulmonary/Chest: Effort normal and breath sounds normal. No respiratory distress. She has no wheezes. She has no rales. She exhibits no tenderness.  Abdominal: Soft.  Musculoskeletal: Normal range of motion. She exhibits no edema and no tenderness.  Neurological: She is alert and oriented to person, place, and time. She has normal reflexes.  Skin: Skin is warm and dry.  Psychiatric: She has a normal mood and affect.    ED Course  Procedures (including critical care time)  Labs Reviewed - No data to display Dg Chest 2 View  04/13/2012  *RADIOLOGY REPORT*  Clinical Data: Hemoptysis.  Cough and congestion for 2 weeks.  CHEST - 2 VIEW  Comparison: 11/04/2011  Findings: Posterior thoracic spine fixation with mild accentuation of expected thoracic kyphosis.  S-shaped thoracolumbar spine curvature is moderate and not significantly changed.  Moderate cardiomegaly.  Mild left hemidiaphragm elevation. No pericardial or pleural effusion.  Lung volumes remain extremely low.  This results in increased density within the lungs, greater right than left.  Patchy areas of lower lobe subsegmental atelectasis. No congestive failure.  IMPRESSION: Cardiomegaly and extremely low lung volumes.  This results in increased density, especially on the right; favored to be due to atelectasis.  Appearance is similar, without convincing evidence of acute superimposed process.  Original Report Authenticated By: Consuello Bossier, M.D.     1. Cough   2. Hemoptysis       MDM  Patient treated for upper respiratory infection with Z-Pak. Small amount of hemoptysis with a past medical history of the same.  Prescription for [facility] for her cough. She is to  followup with Dr. Deeann Dowse on Monday or return with worsening symptoms or shortness of breath chest pain. Chest x-ray unremarkable.    8:24 PM 04/14/12 spoke with the patient over the phone today states that she is feeling better. States that she got her prescriptions filled and has started taking them.         Remi Haggard, NP 04/14/12 2025

## 2012-04-15 NOTE — ED Provider Notes (Signed)
Medical screening examination/treatment/procedure(s) were performed by non-physician practitioner and as supervising physician I was immediately available for consultation/collaboration.   Carleene Cooper III, MD 04/15/12 2019

## 2012-04-22 DIAGNOSIS — I2729 Other secondary pulmonary hypertension: Secondary | ICD-10-CM | POA: Insufficient documentation

## 2012-04-29 ENCOUNTER — Encounter: Payer: Self-pay | Admitting: Internal Medicine

## 2012-06-07 ENCOUNTER — Other Ambulatory Visit: Payer: Self-pay | Admitting: Internal Medicine

## 2012-06-07 NOTE — Telephone Encounter (Signed)
Patient request refill on medication Hydrocodone Acetaminophen 7.5-500mg . Last refill 04/13/2012. Please advise . Patient of Dr. Debby Bud out of office

## 2012-06-07 NOTE — Telephone Encounter (Signed)
Patient request refill on medication Hydrocodone Acetaminophen 7.5-500mg . Last refill 04/13/2012

## 2012-06-07 NOTE — Telephone Encounter (Signed)
REFILL PAIN MEDICATION LORTAB CALLED TO [facility] WITH APPROVAL PER DR. Debby Bud VERBAL

## 2012-06-12 ENCOUNTER — Telehealth: Payer: Self-pay | Admitting: Internal Medicine

## 2012-06-12 NOTE — Telephone Encounter (Signed)
The pt's boyfriend called and is stating he is concerned that insurance will no longer pay for her oxygen supply.  He is under the impression that you can intervene to prevent them from canceling her oxygen coverage.  Do you want her to come for an apt to discuss this?   Thanks!

## 2012-06-13 NOTE — Telephone Encounter (Signed)
Need to contact O2 provider to see if her certification is lapsing. If yes, needs home oximetry testing both daytime w/ activity and overnight.

## 2012-06-14 NOTE — Telephone Encounter (Signed)
The patient's boyfriend stated the patient's pulmonary dr has sent [facility] in to supply her with more oxygen.

## 2012-06-24 ENCOUNTER — Encounter: Payer: Self-pay | Admitting: Internal Medicine

## 2012-06-24 ENCOUNTER — Ambulatory Visit (INDEPENDENT_AMBULATORY_CARE_PROVIDER_SITE_OTHER): Payer: Self-pay | Admitting: Internal Medicine

## 2012-06-24 VITALS — BP 118/68 | HR 68 | Temp 97.8°F | Resp 16 | Ht 60.0 in | Wt 177.0 lb

## 2012-06-24 DIAGNOSIS — I50810 Right heart failure, unspecified: Secondary | ICD-10-CM

## 2012-06-24 DIAGNOSIS — I509 Heart failure, unspecified: Secondary | ICD-10-CM

## 2012-06-24 DIAGNOSIS — I2789 Other specified pulmonary heart diseases: Secondary | ICD-10-CM

## 2012-06-25 MED ORDER — TADALAFIL (PAH) 20 MG PO TABS: 20.0000 mg | ORAL_TABLET | Freq: Two times a day (BID) | ORAL | Status: DC

## 2012-06-25 NOTE — Assessment & Plan Note (Signed)
She has trouble maneuvering oxygen tanks - she does remain mobile and active  Plan Evaluation for [employer] liquid oxygen system.

## 2012-06-25 NOTE — Progress Notes (Signed)
Subjective:    Patient ID: [name], female    DOB: 06-27-1986, 26 y.o.   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 She has had hospital admission for hemoptysis in the last year, is on on oxygen full-time and was recently started on Adcirca 20 mg bid for treatment (phosphodiesterase V). Since starting this medication she has had HA, a sensation of swollen feet w/o swelling and left leg pain. She has not had recent hemoptysis.  Past Medical History  Diagnosis Date  . Back pain   . GERD (gastroesophageal reflux disease)   . Asthma     As a child  . Scoliosis deformity of spine   . Anxiety   . Tetralogy of Fallot    Past Surgical History  Procedure Date  . Tetralogy of fallot repair     Age 45 at [facility]  . Blalock procedure     3 months  . Back surgery     Rods for scoliosis   Family History  Problem Relation Age of Onset  . Diabetes Mother   . Hypertension Mother   . Cancer Mother     uterine cancer  . Alcohol abuse Father   . Cancer Maternal Aunt     Breast Cancer, Lung cancer   History   Social History  . Marital Status: Single    Spouse Name: N/A    Number of Children: 0  . Years of Education: N/A   Occupational History  . Not on file.   Social History Main Topics  . Smoking status: Former Smoker -- 1.5 packs/day for 10 years    Types: Cigarettes    Quit date: 02/02/2011  . Smokeless tobacco: Not on file  . Alcohol Use: Yes  . Drug Use: No  . Sexually Active: Not on file   Other Topics Concern  . Not on file   Social History Narrative   Unemployed at present.  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 ALPRAZolam (XANAX) 0.25 MG tablet Take 0.25-0.5 mg  by mouth daily as needed. For anxiety.      Marland Kitchen FLUoxetine (PROZAC) 20 MG capsule Take 20 mg by mouth every morning.      . furosemide (LASIX) 40 MG tablet Take 40 mg by mouth daily.      Marland Kitchen HYDROcodone-acetaminophen (LORTAB) 7.5-500 MG per tablet TAKE ONE TABLET BY MOUTH EVERY 6 HOURS AS NEEDED FOR PAIN  120 tablet  1  . potassium chloride (K-DUR) 10 MEQ tablet Take 10 mEq by mouth daily.           Adcirca                                               20 mg twice a day   Review of Systems System review is negative for any constitutional, cardiac, pulmonary, GI or neuro symptoms or complaints other than as described in the HPI.     Objective:   Physical Exam Filed Vitals:   06/24/12 1221  BP: 118/68  Pulse: 68  Temp: 97.8 F (36.6 C)  Resp: 16  O2 sat in normal range on oxygen Gen'l- barrel chested white woman in no distress Cor- 2+ radial pulse RRR Pulm - on oxygen, no increased WOB Back exam: normal stand; normal flex to greater than 100 degrees; normal gait; normal toe/heel walk; normal step up to exam table; normal SLR sitting; normal DTRs at the patellar tendons; normal sensation to light touch, pin-prick and deep vibratory stimulus; no  CVA tenderness; able to move supine to sitting witout assistance.        Assessment & Plan:  Leg pain - normal back exam Adverse effects of Adcirca includes limb pain, along with headache. Suspect this is the origin of her discomfort and she is encouraged to contact her doctors at [facility]. Clearly, the potential benefit of treatment counter-balances her discomfort.

## 2012-07-19 ENCOUNTER — Telehealth: Payer: Self-pay

## 2012-07-19 NOTE — Telephone Encounter (Signed)
[name] called requesting a call back regarding O2 sats for this pt because of recent increase on her O2.

## 2012-07-22 NOTE — Telephone Encounter (Signed)
No recording of O2 sats noted

## 2012-07-30 ENCOUNTER — Other Ambulatory Visit: Payer: Self-pay | Admitting: Internal Medicine

## 2012-07-31 ENCOUNTER — Other Ambulatory Visit: Payer: Self-pay | Admitting: Internal Medicine

## 2012-07-31 NOTE — Telephone Encounter (Signed)
Patient reqeust refill on pain medication hydrocodone-apap. Last OV [id number]

## 2012-08-01 NOTE — Telephone Encounter (Signed)
MEDICATION REFILL CALLED TO [facility]. PATIENT NOTIFIED. 

## 2012-08-13 DIAGNOSIS — R0602 Shortness of breath: Secondary | ICD-10-CM | POA: Insufficient documentation

## 2012-08-13 DIAGNOSIS — Z79899 Other long term (current) drug therapy: Secondary | ICD-10-CM | POA: Insufficient documentation

## 2012-08-15 DIAGNOSIS — J984 Other disorders of lung: Secondary | ICD-10-CM | POA: Insufficient documentation

## 2012-08-22 ENCOUNTER — Ambulatory Visit (INDEPENDENT_AMBULATORY_CARE_PROVIDER_SITE_OTHER): Payer: Self-pay | Admitting: Internal Medicine

## 2012-08-22 ENCOUNTER — Encounter: Payer: Self-pay | Admitting: Internal Medicine

## 2012-08-22 ENCOUNTER — Other Ambulatory Visit (INDEPENDENT_AMBULATORY_CARE_PROVIDER_SITE_OTHER): Payer: Self-pay

## 2012-08-22 VITALS — BP 128/66 | HR 86 | Temp 98.1°F | Resp 16 | Wt 181.8 lb

## 2012-08-22 DIAGNOSIS — I2789 Other specified pulmonary heart diseases: Secondary | ICD-10-CM

## 2012-08-22 DIAGNOSIS — Z309 Encounter for contraceptive management, unspecified: Secondary | ICD-10-CM

## 2012-08-22 DIAGNOSIS — I50810 Right heart failure, unspecified: Secondary | ICD-10-CM

## 2012-08-22 DIAGNOSIS — Z23 Encounter for immunization: Secondary | ICD-10-CM

## 2012-08-22 DIAGNOSIS — I509 Heart failure, unspecified: Secondary | ICD-10-CM

## 2012-08-22 DIAGNOSIS — IMO0001 Reserved for inherently not codable concepts without codable children: Secondary | ICD-10-CM

## 2012-08-22 LAB — HCG, QUANTITATIVE, PREGNANCY: hCG, Beta Chain, Quant, S: 0.32 m[IU]/mL

## 2012-08-22 MED ORDER — NORGESTREL-ETHINYL ESTRADIOL 0.3-30 MG-MCG PO TABS: 1.0000 | ORAL_TABLET | Freq: Every day | ORAL | Status: DC

## 2012-08-22 NOTE — Progress Notes (Signed)
Subjective:     Patient ID: [name], female   DOB: [birth year], 26 y.o.   MRN: [id number]  HPI Comments: [name] is a 26 yo female with a history of tetralogy of fallot who has come to clinic today to discuss birth control prior to starting a new tetragenic medication. She had expressed that she wanted to retain the ability to get pregnant in the future and did not want to consider permanent birth control measures at this time. After discussion, she decided that she wanted to begin oral contraceptive pills today and possibly switch to an IUD in the future. She denied a history of DVT, PE, and clotting disorders.  Past Medical History  Diagnosis Date  . Back pain   . GERD (gastroesophageal reflux disease)   . Asthma     As a child  . Scoliosis deformity of spine   . Anxiety   . Tetralogy of Fallot    Past Surgical History  Procedure Date  . Tetralogy of fallot repair     Age 37 at [facility]  . Blalock procedure     3 months  . Back surgery     Rods for scoliosis   Allergies  Allergen Reactions  . Avelox (Moxifloxacin Hcl In Nacl)     Hives itching   Current Outpatient Prescriptions on File Prior to Visit  Medication Sig Dispense Refill  . ALPRAZolam (XANAX) 0.25 MG tablet Take 0.25-0.5 mg by mouth daily as needed. For anxiety.      Marland Kitchen FLUoxetine (PROZAC) 20 MG capsule Take 20 mg by mouth every morning.      . furosemide (LASIX) 40 MG tablet Take 40 mg by mouth daily.      Marland Kitchen HYDROcodone-acetaminophen (LORTAB) 7.5-500 MG per tablet TAKE ONE TABLET BY MOUTH EVERY 6 HOURS AS NEEDED FOR PAIN  120 tablet  3  . potassium chloride (K-DUR) 10 MEQ tablet Take 10 mEq by mouth daily.      . Tadalafil, PAH, 20 MG TABS Take 1 tablet (20 mg total) by mouth 2 (two) times daily.  60 tablet     History   Social History  . Marital Status: Single    Spouse Name: N/A    Number of Children: 0  . Years of Education: N/A   Occupational History  . Not on file.   Social History Main  Topics  . Smoking status: Former Smoker -- 1.5 packs/day for 10 years    Types: Cigarettes    Quit date: 02/02/2011  . Smokeless tobacco: Never Used  . Alcohol Use: Yes  . Drug Use: No  . Sexually Active: Not on file   Other Topics Concern  . Not on file   Social History Narrative   Unemployed at present.  Worked as [name], [employer] at [employer], [employer]- a little bitSingle, in a 4 year relationshipNo children UnemployedNo history of abuse     Review of Systems  All other systems reviewed and are negative.       Objective:   Physical Exam  Vitals reviewed. Constitutional: She is oriented to person, place, and time. No distress.  Cardiovascular: Normal rate and regular rhythm.  Exam reveals no gallop and no friction rub.   Murmur heard.      PT has a holosystolic murmer that could be heard in all fields.  Pulmonary/Chest: Effort normal. No respiratory distress. She exhibits no tenderness.       PT was on supplemental oxygen via [place] and appeared to  be tolerating it well.   Neurological: She is alert and oriented to person, place, and time.  Skin: She is not diaphoretic.  Psychiatric: She has a normal mood and affect. Her behavior is normal. Judgment and thought content normal.       Assessment & Plan:    1. Birth control needed prior to beginning tetragenic medication - discussed birth control options with patient and she decided to use oral contraceptive pills and possibly switch to an IUD in the future.   Plan - Obtain a baseline urine pregnancy test. Begin  2. Overdue TDAP Vaccine - patient is due for her TDap booster  Plan - administer TDap booster vaccine today.    Attending note: patient interviewed and examined. Note from [facility] reviewed. Agree with assessment and plan.  Patient started on LoOvral 28 day - to start the first Sunday post on-set menses.  Addendum: quant Beta-HCG normal

## 2012-08-24 NOTE — Assessment & Plan Note (Signed)
Preparing to undertake treatment with new teratogenic medication - will start OCPs. Pregnancy test negative.

## 2012-09-04 ENCOUNTER — Telehealth: Payer: Self-pay | Admitting: Internal Medicine

## 2012-09-04 NOTE — Telephone Encounter (Signed)
The pt's boyfriend is hoping to get understanding about how she can get different oxygen tanks that are smaller. He spoke with [facility] and they told him [name] would need an oxygen sat test in the office.  He mentioned a "walt" test or something sounding similar.   Do you want her to have an ov to discuss with you?  Or do you prefer this to be on the nurse's schedule as a o2 reading.   Thanks!

## 2012-09-05 NOTE — Telephone Encounter (Signed)
Called pt to schedule, no answer on phone number listed.   Will try back if call isn't returned.  Thanks!

## 2012-09-05 NOTE — Telephone Encounter (Signed)
Nurse visit: o2 sat on room air at rest and with 150' ambulation. Repeat on oxygen. Record readings in an EPIC progress note.

## 2012-09-06 ENCOUNTER — Telehealth: Payer: Self-pay | Admitting: Internal Medicine

## 2012-09-06 MED ORDER — SULFAMETHOXAZOLE-TRIMETHOPRIM 800-160 MG PO TABS: 1.0000 | ORAL_TABLET | Freq: Two times a day (BID) | ORAL | Status: AC

## 2012-09-06 MED ORDER — PROMETHAZINE-CODEINE 6.25-10 MG/5ML PO SYRP: ORAL_SOLUTION | ORAL | Status: DC

## 2012-09-06 NOTE — Telephone Encounter (Signed)
Known to me: severe pulmonary hypertension.  Plan phenergan w/ codeine 1 tsp q 6 240 ml  Avelox 400 mg qd x 7  Call if not better or if SOB gets worse or hemoptysis

## 2012-09-06 NOTE — Telephone Encounter (Signed)
Patient notified and allergic to Avelox, RX for Septra DS BID x 7 called in

## 2012-09-06 NOTE — Telephone Encounter (Signed)
Pt's boyfriend called back, pt scheduled for next week.

## 2012-09-06 NOTE — Telephone Encounter (Signed)
Caller: Zena Amos; Patient Name: [name]; PCP: Illene Regulus (Adults only); Best Callback Phone Number: [phone]; Call regarding Cough with history of Pulmonary Hypertension. Productive Cough, green sputum, onset 9-12.  Afebrile. Blood Pressure normal per Caller, Patient can't remember reading.  Per Caller, Dr Debby Bud advised Toni Amend to call for Cough medication due to history. Appointment offered, Patient doesn't feel she needs to be seen.  All emergent symptoms ruled out per Cough Protocol, see in 4 hours, new onset Cough, cardiac history.  Patient uses [facility] [place], [id number].

## 2012-09-11 ENCOUNTER — Institutional Professional Consult (permissible substitution): Payer: Self-pay

## 2012-09-17 ENCOUNTER — Ambulatory Visit (INDEPENDENT_AMBULATORY_CARE_PROVIDER_SITE_OTHER): Payer: Self-pay | Admitting: Internal Medicine

## 2012-09-17 DIAGNOSIS — I50810 Right heart failure, unspecified: Secondary | ICD-10-CM

## 2012-09-17 DIAGNOSIS — I2729 Other secondary pulmonary hypertension: Secondary | ICD-10-CM

## 2012-09-17 DIAGNOSIS — I509 Heart failure, unspecified: Secondary | ICD-10-CM

## 2012-09-17 DIAGNOSIS — I2789 Other specified pulmonary heart diseases: Secondary | ICD-10-CM

## 2012-09-17 NOTE — Progress Notes (Signed)
Patient her for O2 Sat readings. 1430 BP 110/58, temp 98.1, weigth 177 lb. Pulse 73 O2 sat 98% with nasel O2 @ 3 liters sitting in chair  1435 O2 sat 76  HR 112 after 3 laps of walking on No O2  1440 O2 sat 84  HR 107  After 3 laps walking  With O2 at 3 liters   1445 O2 sat 97  HR 83  sitting with O2 at 3 litter   1530 O2 sat 93  HR 71  Sitting with out O2 x 5 minutes

## 2012-09-20 ENCOUNTER — Encounter: Payer: Self-pay | Admitting: Internal Medicine

## 2012-09-20 NOTE — Progress Notes (Signed)
  Subjective:    Patient ID: [name], female    DOB: [birth year], 26 y.o.   MRN: [id number]  HPI Nurse visit for O2 sats at rest and with ambulation. She has definite desaturation with exertion - needs portable O2 either liquid oxygen cannister or portable concentrator. She wishes to change respiratory provider to [facility].   Review of Systems     Objective:   Physical Exam        Assessment & Plan:

## 2012-09-21 IMAGING — CR DG CHEST 2V
2 series · 2 of 2 positions shown · non-contrast
Comparison: 10/31/2011.  CT 11/01/2011.

CLINICAL DATA: History of hemoptysis and weakness.

CHEST - 2 VIEW

[w chest pa]
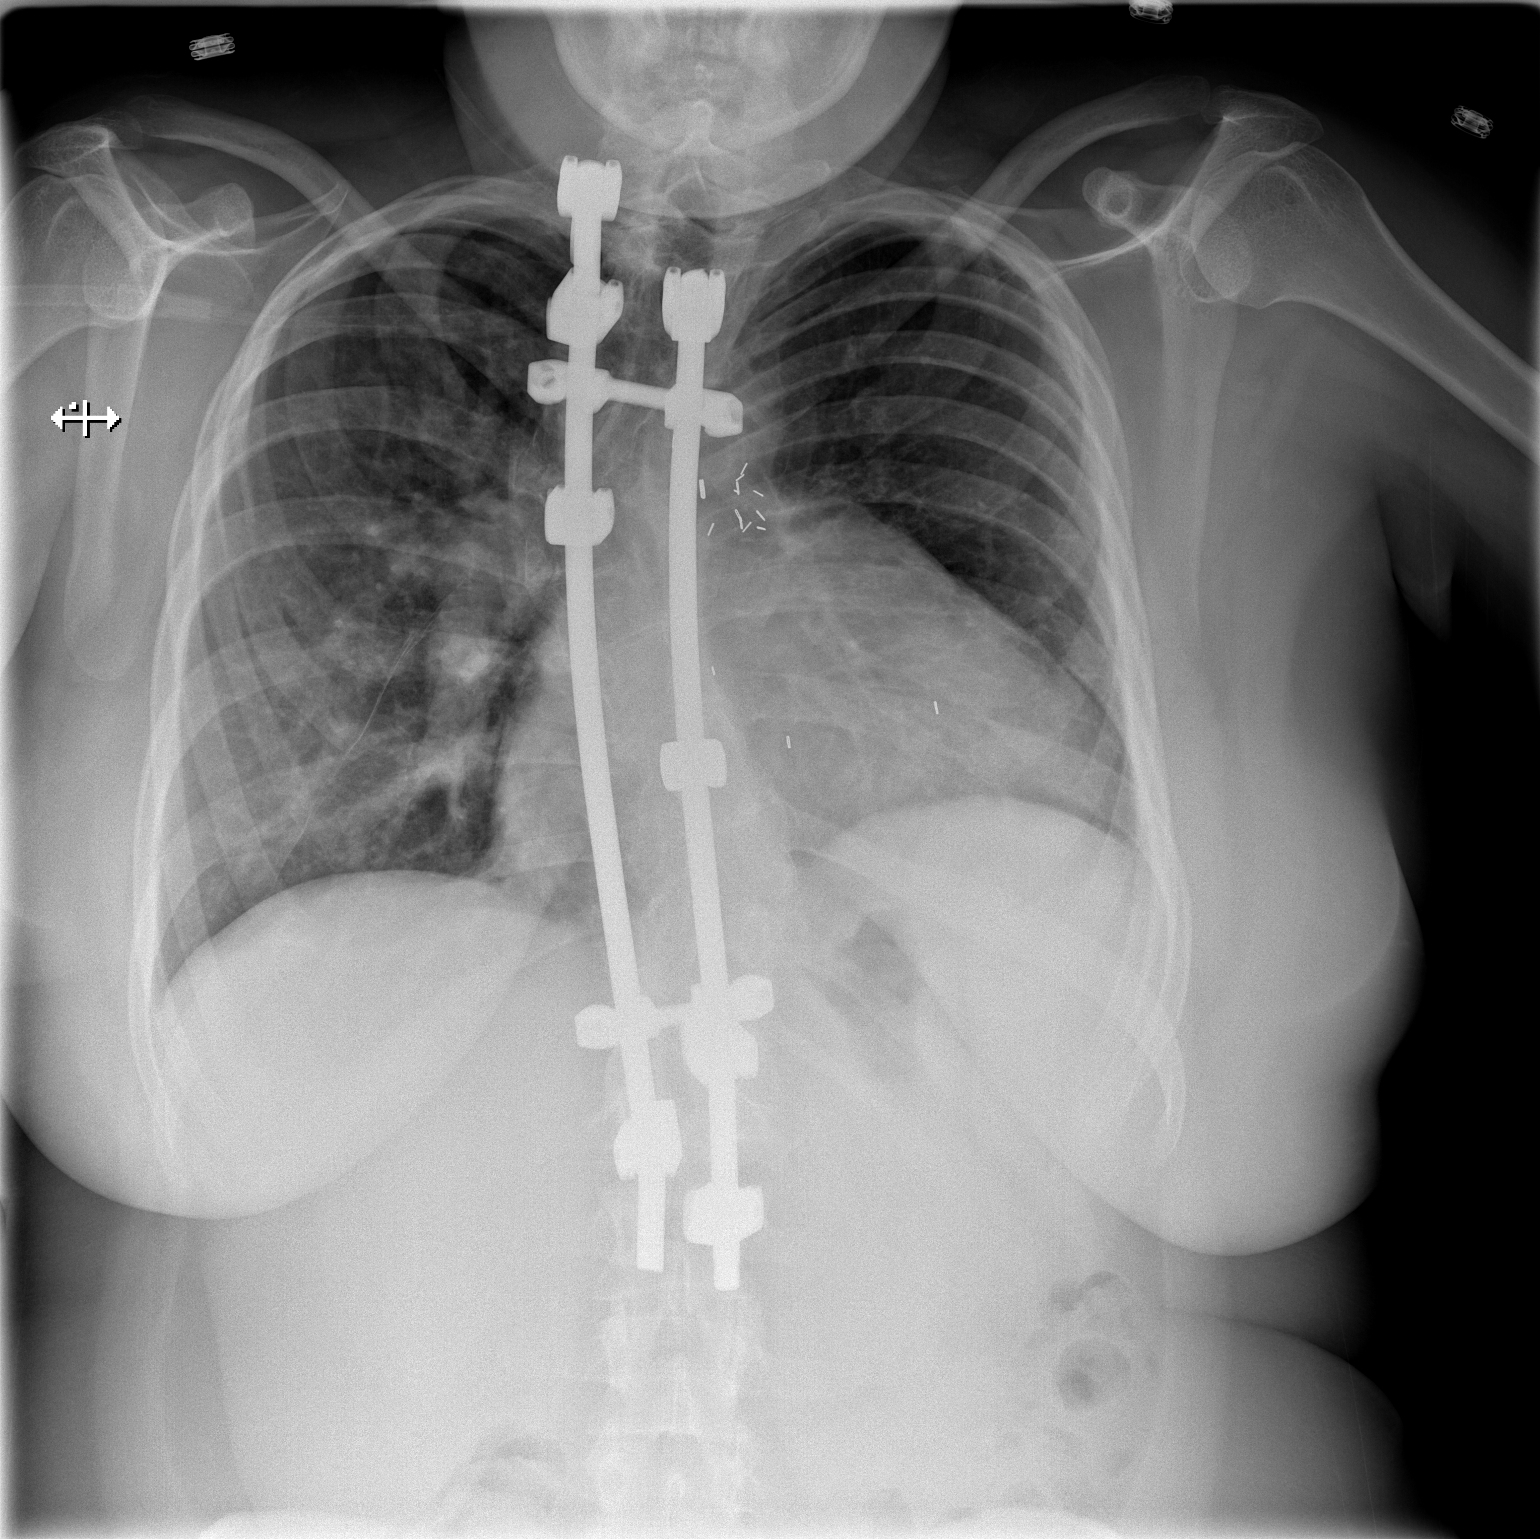

[w chest lat]
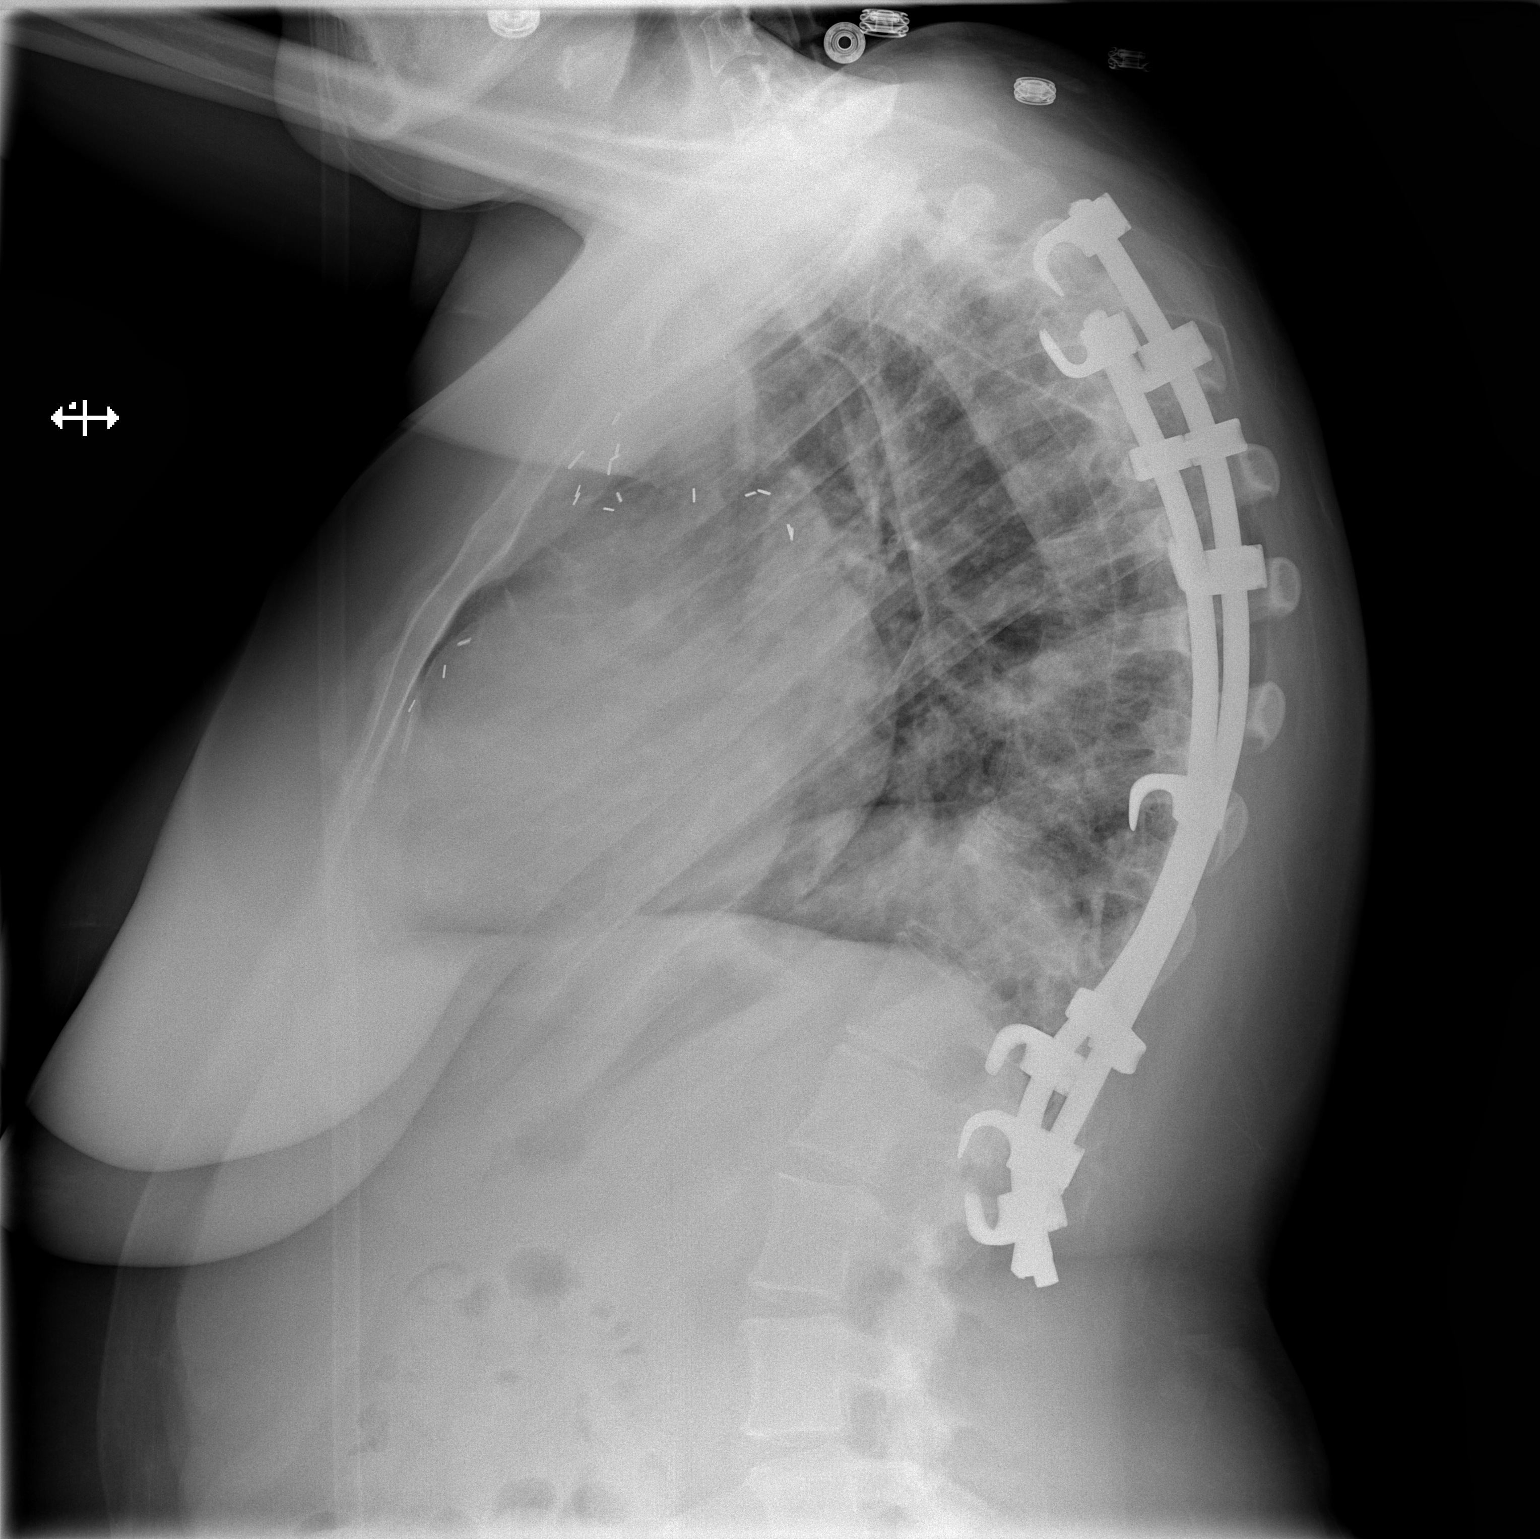

[2 of 2 positions shown; findings below may reference images not displayed]

FINDINGS: There is stable moderate enlargement of the cardiac
silhouette.  Surgical clips are seen in the mediastinum unchanged.
Thoracolumbar scoliosis is stable.  The patient has undergone
previous surgery with metallic rod hardware unchanged.  There is
elevation of the left hemidiaphragm with atelectasis and infiltrate
in left base.  No consolidation is demonstrated on the plain
imaging study.  There is minimal atelectasis in the right lower
lobe.  Increased markings within the right mid and lower lung area
appeared decreased compared to previous study.  There is central
vascular congestion.  No new infiltrates evident.  No pleural
effusion is seen.  No pneumothorax is evident.
IMPRESSION: Stable cardiac silhouette enlargement. Elevation  of the left
hemidiaphragm with atelectasis and infiltrate in left base.  No
consolidation is demonstrated on the plain imaging study.  There is
minimal atelectasis in the right lower lobe.  Increased markings
within the right mid and lower lung areas appear decreased compared
to previous study.  There is central vascular congestion.  No new
infiltrates evident.

## 2012-09-21 NOTE — Assessment & Plan Note (Signed)
Pulmonary hypertension with  Very poor pulmonary function.  Plan - refer to [facility] for respiratory management and portable oxygen.

## 2012-09-22 ENCOUNTER — Other Ambulatory Visit: Payer: Self-pay | Admitting: Internal Medicine

## 2012-10-07 ENCOUNTER — Other Ambulatory Visit: Payer: Self-pay | Admitting: Internal Medicine

## 2012-10-07 DIAGNOSIS — T887XXA Unspecified adverse effect of drug or medicament, initial encounter: Secondary | ICD-10-CM

## 2012-10-08 ENCOUNTER — Telehealth: Payer: Self-pay | Admitting: *Deleted

## 2012-10-08 NOTE — Telephone Encounter (Signed)
Orders done 10/14 and are in the system

## 2012-10-08 NOTE — Telephone Encounter (Signed)
Need lab orders to be put in  So labs can be done to reorder her heart medication. Can not get medication till labs are done. [name] call back # [phone]

## 2012-10-09 NOTE — Telephone Encounter (Signed)
Patient notified of lab orders in system

## 2012-10-11 ENCOUNTER — Other Ambulatory Visit (INDEPENDENT_AMBULATORY_CARE_PROVIDER_SITE_OTHER): Payer: Self-pay

## 2012-10-11 DIAGNOSIS — T887XXA Unspecified adverse effect of drug or medicament, initial encounter: Secondary | ICD-10-CM

## 2012-10-11 LAB — HEPATIC FUNCTION PANEL
Alkaline Phosphatase: 56 U/L (ref 39–117)
Bilirubin, Direct: 0.3 mg/dL (ref 0.0–0.3)
Total Protein: 7.4 g/dL (ref 6.0–8.3)

## 2012-10-11 LAB — CBC WITH DIFFERENTIAL/PLATELET
Basophils Absolute: 0 10*3/uL (ref 0.0–0.1)
Basophils Relative: 0.4 % (ref 0.0–3.0)
Eosinophils Absolute: 0.4 10*3/uL (ref 0.0–0.7)
Lymphocytes Relative: 22.6 % (ref 12.0–46.0)
MCHC: 33.1 g/dL (ref 30.0–36.0)
Neutrophils Relative %: 62.4 % (ref 43.0–77.0)
RBC: 3.93 Mil/uL (ref 3.87–5.11)
WBC: 6.3 10*3/uL (ref 4.5–10.5)

## 2012-10-11 LAB — HCG, QUANTITATIVE, PREGNANCY: hCG, Beta Chain, Quant, S: 0.11 m[IU]/mL

## 2012-10-14 ENCOUNTER — Encounter: Payer: Self-pay | Admitting: Internal Medicine

## 2012-10-14 ENCOUNTER — Telehealth: Payer: Self-pay | Admitting: *Deleted

## 2012-10-14 NOTE — Telephone Encounter (Signed)
Message copied by Elnora Morrison on Mon Oct 14, 2012  6:12 PM ------      Message from: Jacques Navy      Created: Mon Oct 14, 2012  7:38 AM       Letter. Call patient - is there a [facility] clinic to who we should send copies of her lab?

## 2012-10-14 NOTE — Telephone Encounter (Signed)
SPOKE WITH PATIENT CONCERNING WHERE LAB RESULTS SHOULD BE SENT TO. STATES THEY SHOULD GO TO HER PULMONOLOGIST  DR. Marlane Mingle, PULMONARY AT [facility] AND TO [facility] THAT THEY HAVE TO HAVE BEFORE SHE CAN GET HER MEDICATIONS. COPY OF LABS WILL BE FAXED TO THESE CLINICS.

## 2012-10-15 NOTE — Telephone Encounter (Signed)
Lab results faxed to [facility], Dr. Marlane Mingle at pulmonary at [facility], and to [facility] to [facility].Marland Kitchen

## 2012-10-15 NOTE — Telephone Encounter (Signed)
Please send the lab results.

## 2012-10-18 ENCOUNTER — Other Ambulatory Visit: Payer: Self-pay | Admitting: Internal Medicine

## 2012-10-18 MED ORDER — ALPRAZOLAM 0.25 MG PO TABS: ORAL_TABLET | ORAL | Status: DC

## 2012-10-18 NOTE — Addendum Note (Signed)
Addended by: Elnora Morrison on: 10/18/2012 10:54 AM   Modules accepted: Orders

## 2012-10-18 NOTE — Telephone Encounter (Signed)
Refill for alprazolam printed and to be signed per Dr, Posey Rea. Dr, Debby Bud out of office.

## 2012-10-23 ENCOUNTER — Telehealth: Payer: Self-pay | Admitting: Internal Medicine

## 2012-10-23 NOTE — Telephone Encounter (Signed)
Caller [name] / fiance about last visit to office in September.  Patient understood that her oxygen supplier would be changed from [facility] to [facility] so she could receive smaller oxygen tanks for transport.  Says it is so hard to take her anywhere with the big tanks.  Condition stable - just trying to check on order for the service at [facility]. Has called [facility] and they have not received order for the oxygen to be supplied.   Asking for someone to check into this since has been waiting for about a month. Can reach caller/[name] at  cell  [phone]  (may leave message if phone not answered)

## 2012-10-23 NOTE — Telephone Encounter (Signed)
Directed to [facility] - [facility] to [facility] please. If new order needed let me know.

## 2012-11-12 ENCOUNTER — Telehealth: Payer: Self-pay | Admitting: *Deleted

## 2012-11-12 NOTE — Telephone Encounter (Signed)
Ok - written order on counter

## 2012-11-12 NOTE — Telephone Encounter (Signed)
Ubaldo Glassing called stating [facility] is requesting an order for a portable oxygen unit. Please advise.

## 2012-11-12 NOTE — Telephone Encounter (Signed)
Order faxed to [facility] ([phone]). 

## 2012-11-14 ENCOUNTER — Other Ambulatory Visit: Payer: Self-pay | Admitting: Internal Medicine

## 2012-11-15 ENCOUNTER — Other Ambulatory Visit: Payer: Self-pay | Admitting: Internal Medicine

## 2012-11-15 NOTE — Telephone Encounter (Signed)
rx faxed to pharmacy

## 2012-11-25 ENCOUNTER — Other Ambulatory Visit: Payer: Self-pay | Admitting: Internal Medicine

## 2012-11-25 ENCOUNTER — Other Ambulatory Visit (INDEPENDENT_AMBULATORY_CARE_PROVIDER_SITE_OTHER): Payer: Medicaid Other

## 2012-11-25 DIAGNOSIS — T887XXA Unspecified adverse effect of drug or medicament, initial encounter: Secondary | ICD-10-CM

## 2012-11-25 LAB — HEPATIC FUNCTION PANEL
ALT: 112 U/L — ABNORMAL HIGH (ref 0–35)
AST: 101 U/L — ABNORMAL HIGH (ref 0–37)
Albumin: 4.7 g/dL (ref 3.5–5.2)
Alkaline Phosphatase: 54 U/L (ref 39–117)
Bilirubin, Direct: 0.6 mg/dL — ABNORMAL HIGH (ref 0.0–0.3)
Total Bilirubin: 1.4 mg/dL — ABNORMAL HIGH (ref 0.3–1.2)
Total Protein: 8.4 g/dL — ABNORMAL HIGH (ref 6.0–8.3)

## 2012-11-25 LAB — CBC WITH DIFFERENTIAL/PLATELET
Basophils Relative: 0.7 % (ref 0.0–3.0)
Eosinophils Absolute: 0.2 10*3/uL (ref 0.0–0.7)
Hemoglobin: 13 g/dL (ref 12.0–15.0)
MCHC: 32.7 g/dL (ref 30.0–36.0)
MCV: 87.7 fl (ref 78.0–100.0)
Monocytes Absolute: 0.6 10*3/uL (ref 0.1–1.0)
Neutro Abs: 4.8 10*3/uL (ref 1.4–7.7)
RBC: 4.52 Mil/uL (ref 3.87–5.11)

## 2012-11-25 MED ORDER — HYDROCODONE-ACETAMINOPHEN 7.5-325 MG PO TABS: 1.0000 | ORAL_TABLET | ORAL | Status: DC | PRN

## 2012-12-09 ENCOUNTER — Encounter: Payer: Self-pay | Admitting: Internal Medicine

## 2012-12-28 ENCOUNTER — Emergency Department (HOSPITAL_COMMUNITY)
Admission: EM | Admit: 2012-12-28 | Discharge: 2012-12-28 | Disposition: A | Discharge status: Home / Self Care | Payer: Medicaid Other | Attending: Emergency Medicine | Admitting: Emergency Medicine

## 2012-12-28 ENCOUNTER — Emergency Department (HOSPITAL_COMMUNITY): Payer: Medicaid Other

## 2012-12-28 ENCOUNTER — Encounter (HOSPITAL_COMMUNITY): Payer: Self-pay | Admitting: *Deleted

## 2012-12-28 DIAGNOSIS — R042 Hemoptysis: Secondary | ICD-10-CM

## 2012-12-28 DIAGNOSIS — K219 Gastro-esophageal reflux disease without esophagitis: Secondary | ICD-10-CM | POA: Insufficient documentation

## 2012-12-28 DIAGNOSIS — Z8679 Personal history of other diseases of the circulatory system: Secondary | ICD-10-CM | POA: Insufficient documentation

## 2012-12-28 DIAGNOSIS — J45909 Unspecified asthma, uncomplicated: Secondary | ICD-10-CM | POA: Insufficient documentation

## 2012-12-28 DIAGNOSIS — I509 Heart failure, unspecified: Secondary | ICD-10-CM | POA: Insufficient documentation

## 2012-12-28 DIAGNOSIS — J189 Pneumonia, unspecified organism: Secondary | ICD-10-CM | POA: Insufficient documentation

## 2012-12-28 DIAGNOSIS — Z87891 Personal history of nicotine dependence: Secondary | ICD-10-CM | POA: Insufficient documentation

## 2012-12-28 DIAGNOSIS — I2789 Other specified pulmonary heart diseases: Secondary | ICD-10-CM | POA: Insufficient documentation

## 2012-12-28 DIAGNOSIS — Z8739 Personal history of other diseases of the musculoskeletal system and connective tissue: Secondary | ICD-10-CM | POA: Insufficient documentation

## 2012-12-28 DIAGNOSIS — F411 Generalized anxiety disorder: Secondary | ICD-10-CM | POA: Insufficient documentation

## 2012-12-28 DIAGNOSIS — Z79899 Other long term (current) drug therapy: Secondary | ICD-10-CM | POA: Insufficient documentation

## 2012-12-28 DIAGNOSIS — Z9889 Other specified postprocedural states: Secondary | ICD-10-CM | POA: Insufficient documentation

## 2012-12-28 HISTORY — DX: Pulmonary hypertension, unspecified: I27.20

## 2012-12-28 HISTORY — DX: Heart failure, unspecified: I50.9

## 2012-12-28 LAB — POCT I-STAT, CHEM 8
Chloride: 102 mEq/L (ref 96–112)
HCT: 33 % — ABNORMAL LOW (ref 36.0–46.0)
Potassium: 3.5 mEq/L (ref 3.5–5.1)

## 2012-12-28 MED ORDER — HYDROCODONE-ACETAMINOPHEN 5-325 MG PO TABS
2.0000 | ORAL_TABLET | Freq: Once | ORAL | Status: AC
  Administered 2012-12-28: 2 via ORAL
  Filled 2012-12-28: qty 2

## 2012-12-28 MED ORDER — IOHEXOL 350 MG/ML SOLN
100.0000 mL | Freq: Once | INTRAVENOUS | Status: AC | PRN
  Administered 2012-12-28: 100 mL via INTRAVENOUS

## 2012-12-28 MED ORDER — OXYCODONE-ACETAMINOPHEN 5-325 MG PO TABS
2.0000 | ORAL_TABLET | Freq: Once | ORAL | Status: AC
  Administered 2012-12-28: 2 via ORAL
  Filled 2012-12-28: qty 2

## 2012-12-28 MED ORDER — DEXTROSE 5 % IV SOLN
1.0000 g | Freq: Once | INTRAVENOUS | Status: AC
  Administered 2012-12-28: 1 g via INTRAVENOUS
  Filled 2012-12-28: qty 10

## 2012-12-28 MED ORDER — DEXTROSE 5 % IV SOLN
500.0000 mg | Freq: Once | INTRAVENOUS | Status: AC
  Administered 2012-12-28: 500 mg via INTRAVENOUS
  Filled 2012-12-28: qty 500

## 2012-12-28 NOTE — ED Notes (Signed)
Pt states that this morning she coughed up blood.  Denies recent cough, fevers, chills, n/v.  Hx pulmonary HTN, CHF.

## 2012-12-28 NOTE — ED Notes (Signed)
Patient transported to X-ray 

## 2012-12-28 NOTE — ED Provider Notes (Signed)
06:00 AM Assumed care of the patient from PA Dammen.  Patient here for episode of hemoptysis.  She's past medical history of tetralogy of flow with resulting pulmonary hypertension.  Patient had one small episode of hemoptysis.  She coughed up approximately a teaspoon full of blood.  She's had this before.  She states that in the past when she's had hemoptysis she has had an underlying pulmonary infection.  Her chest x-ray was negative.  CT scan of the chest is currently pending.  8:08 AM Filed Vitals:   12/28/12 0981 12/28/12 0415 12/28/12 0514 12/28/12 0515  BP: 133/50 133/50 122/49 112/55  Pulse:   62 62  Temp: 98.1 F (36.7 C)     TempSrc: Oral     Resp: 16  18   SpO2: 100%  98% 99%   Patient with bilateral patchy infiltrate on CT.  Its most consistent with pulmonary edema however infection cannot be ruled out.  As patient has only had hemoptysis previously with lung infection we are going to administer treatment for pneumonia today here.  Patient has followup with her pulmonologist at [facility] on Tuesday.  She is also complaining of continued back pain.  She takes Norco at home for her chronic back pain due to previous back surgeries and history of scoliosis.  She is slightly tearful.  She is tender to palpation of the paraspinal muscles. She has not been hospitalized or in SNF in the past 90 days.  Will treat as CAP.  11:13 AM Patient finished IV abx. Tolerated well and no rxn,. Will d/c with pulmonary f./u  Arthor Captain, PA-C 12/28/12 1114

## 2012-12-28 NOTE — ED Notes (Signed)
Pt returned from CT °

## 2012-12-28 NOTE — ED Provider Notes (Signed)
Medical screening examination/treatment/procedure(s) were performed by non-physician practitioner and as supervising physician I was immediately available for consultation/collaboration.   Brave Dack, MD 12/28/12 2345 

## 2012-12-28 NOTE — ED Provider Notes (Signed)
History     CSN: [id number]  Arrival date & time 12/28/12  0407   First MD Initiated Contact with Patient 12/28/12 [phone]      Chief Complaint  Patient presents with  . Hemoptysis   HPI  History provided by the patient and family. Patient is a 27 year old female with history of tetralogy of fallot, pulmonary hypertension and heart failure who presents with symptoms of hemoptysis. Patient reports being up early this morning and having a single episode of hemoptysis. She reports having about a teaspoon size amount of bright red blood come up during a single coughing episode. Patient has not had any other recent coughing or breathing complaints. Patient is normally on 3 L O2 nasal cannula at home. She does increase this during activity. She denies any increase shortness of breath recently. No increased difficulty lying back. No swelling of the extremities. No chest pain or shortness of breath. No fever, chills or sweats.    Past Medical History  Diagnosis Date  . Back pain   . GERD (gastroesophageal reflux disease)   . Asthma     As a child  . Scoliosis deformity of spine   . Anxiety   . Tetralogy of Fallot   . Pulmonary hypertension   . Heart failure     Past Surgical History  Procedure Date  . Tetralogy of fallot repair     Age 1 at [facility]  . Blalock procedure     3 months  . Back surgery     Rods for scoliosis    Family History  Problem Relation Age of Onset  . Diabetes Mother   . Hypertension Mother   . Cancer Mother     uterine cancer  . Alcohol abuse Father   . Cancer Maternal Aunt     Breast Cancer, Lung cancer    History  Substance Use Topics  . Smoking status: Former Smoker -- 1.5 packs/day for 10 years    Types: Cigarettes    Quit date: 02/02/2011  . Smokeless tobacco: Never Used  . Alcohol Use: Yes    OB History    Grav Para Term Preterm Abortions TAB SAB Ect Mult Living                  Review of Systems  Constitutional: Negative for fever and  chills.  Respiratory: Negative for cough and shortness of breath.   Cardiovascular: Negative for chest pain, palpitations and leg swelling.  All other systems reviewed and are negative.    Allergies  Avelox  Home Medications   Current Outpatient Rx  Name  Route  Sig  Dispense  Refill  . ALPRAZOLAM 0.25 MG PO TABS      TAKE ONE TO TWO TABLETS BY MOUTH EVERY DAY AS NEEDED   60 tablet   4   . FLUOXETINE HCL 20 MG PO CAPS   Oral   Take 20 mg by mouth every morning.         Marland Kitchen FLUOXETINE HCL 20 MG PO CAPS      TAKE ONE CAPSULE BY MOUTH EVERY DAY IN THE MORNING FOR ANXIETY   30 capsule   4   . FUROSEMIDE 40 MG PO TABS   Oral   Take 40 mg by mouth daily.         Marland Kitchen HYDROCODONE-ACETAMINOPHEN 7.5-325 MG PO TABS   Oral   Take 1 tablet by mouth every 4 (four) hours as needed for pain.   150 tablet  3   . NORGESTREL-ETHINYL ESTRADIOL 0.3-30 MG-MCG PO TABS   Oral   Take 1 tablet by mouth daily.   1 Package   11   . POTASSIUM CHLORIDE ER 10 MEQ PO TBCR   Oral   Take 10 mEq by mouth daily.         Marland Kitchen PROMETHAZINE-CODEINE 6.25-10 MG/5ML PO SYRP      1 tsp every 6 hrs   240 mL   0   . TADALAFIL (PAH) 20 MG PO TABS   Oral   Take 1 tablet (20 mg total) by mouth 2 (two) times daily.   60 tablet        BP 133/50  Temp 98.1 F (36.7 C) (Oral)  Resp 16  SpO2 100%  Physical Exam  Nursing note and vitals reviewed. Constitutional: She is oriented to person, place, and time. She appears well-developed and well-nourished. No distress.  HENT:  Head: Normocephalic.  Nose: Nose normal.  Mouth/Throat: Oropharynx is clear and moist.  Cardiovascular: Normal rate and regular rhythm.   Pulmonary/Chest: Effort normal. No respiratory distress. She has no wheezes. She has no rales.  Abdominal: Soft. There is no tenderness. There is no rebound and no guarding.  Neurological: She is alert and oriented to person, place, and time.  Skin: Skin is warm and dry.  Psychiatric:  She has a normal mood and affect. Her behavior is normal.    ED Course  Procedures      Dg Chest 2 View  12/28/2012  *RADIOLOGY REPORT*  Clinical Data: Hemoptysis, history heart failure, pulmonary hypertension, scoliosis repair, congenital heart disease/ tetralogy of Fallot with Blalock procedure at age 53 months  CHEST - 2 VIEW  Comparison: 04/13/2012  Findings: Thoracic deformity secondary to scoliosis and prior repair. Enlargement of cardiac silhouette. Indistinct accentuated perihilar pulmonary vascular markings. Question minimal chronic right perihilar opacity. No definite acute infiltrate or pleural effusion. Mild chronic elevation of left diaphragm. No pneumothorax or acute osseous findings.  IMPRESSION: Enlargement of cardiac silhouette with chronic accentuation of perihilar markings, similar to previous exam. No definite acute infiltrate.   Original Report Authenticated By: Ulyses Southward, M.D.      1. Hemoptysis       MDM  4:30AM patient seen and evaluated. Patient appears comfortable in no acute distress. Patient has good O2 sats are normal 3 L nasal cannula. No tachypnea. No complaints of shortness of breath or chest pain.  CXR unremarkable.  Pt discussed with Attending Physician. Given pt's past medical hx will obtain CT angio to r/o PE or other cause of symptoms missed on plain CXR.  Will obtain i-state 8 prior to CT.  Pt discussed in sign out with Arthor Captain PA-C.  She will follow CT angio results.     Angus Seller, [place] 12/28/12 [phone]

## 2012-12-28 NOTE — ED Notes (Signed)
Patient transported to CT 

## 2012-12-28 NOTE — ED Provider Notes (Signed)
Medical screening examination/treatment/procedure(s) were performed by non-physician practitioner and as supervising physician I was immediately available for consultation/collaboration.  Olivia Mackie, MD 12/28/12 2004

## 2012-12-28 NOTE — ED Notes (Signed)
Pt st's she coughed up about 1 tsp of blood around 0300, st's it was thin, runny, bright red blood.  St's this has happened in the past when she was sick but st's she hasn't been sick, hasn't had a cough.  St's the threw up around 12am.

## 2012-12-28 NOTE — ED Notes (Signed)
Pt back from x-ray.

## 2013-01-06 ENCOUNTER — Ambulatory Visit: Payer: Medicaid Other | Admitting: Internal Medicine

## 2013-01-06 ENCOUNTER — Encounter (HOSPITAL_COMMUNITY): Payer: Self-pay | Admitting: *Deleted

## 2013-01-06 ENCOUNTER — Inpatient Hospital Stay (HOSPITAL_COMMUNITY)
Admission: EM | Admit: 2013-01-06 | Discharge: 2013-01-09 | DRG: 193 | Disposition: A | Discharge status: Home / Self Care | Payer: Medicaid Other | Attending: Internal Medicine | Admitting: Internal Medicine

## 2013-01-06 ENCOUNTER — Emergency Department (HOSPITAL_COMMUNITY): Payer: Medicaid Other

## 2013-01-06 DIAGNOSIS — Z888 Allergy status to other drugs, medicaments and biological substances status: Secondary | ICD-10-CM

## 2013-01-06 DIAGNOSIS — I509 Heart failure, unspecified: Secondary | ICD-10-CM | POA: Diagnosis present

## 2013-01-06 DIAGNOSIS — M4120 Other idiopathic scoliosis, site unspecified: Secondary | ICD-10-CM | POA: Diagnosis present

## 2013-01-06 DIAGNOSIS — Z6828 Body mass index (BMI) 28.0-28.9, adult: Secondary | ICD-10-CM

## 2013-01-06 DIAGNOSIS — Z8679 Personal history of other diseases of the circulatory system: Secondary | ICD-10-CM

## 2013-01-06 DIAGNOSIS — I079 Rheumatic tricuspid valve disease, unspecified: Secondary | ICD-10-CM | POA: Diagnosis present

## 2013-01-06 DIAGNOSIS — K219 Gastro-esophageal reflux disease without esophagitis: Secondary | ICD-10-CM | POA: Diagnosis present

## 2013-01-06 DIAGNOSIS — Z79899 Other long term (current) drug therapy: Secondary | ICD-10-CM

## 2013-01-06 DIAGNOSIS — Z87891 Personal history of nicotine dependence: Secondary | ICD-10-CM

## 2013-01-06 DIAGNOSIS — Z8774 Personal history of (corrected) congenital malformations of heart and circulatory system: Secondary | ICD-10-CM

## 2013-01-06 DIAGNOSIS — I50810 Right heart failure, unspecified: Secondary | ICD-10-CM | POA: Diagnosis present

## 2013-01-06 DIAGNOSIS — Q213 Tetralogy of Fallot: Secondary | ICD-10-CM

## 2013-01-06 DIAGNOSIS — F411 Generalized anxiety disorder: Secondary | ICD-10-CM | POA: Diagnosis present

## 2013-01-06 DIAGNOSIS — J189 Pneumonia, unspecified organism: Principal | ICD-10-CM | POA: Diagnosis present

## 2013-01-06 DIAGNOSIS — I2789 Other specified pulmonary heart diseases: Secondary | ICD-10-CM | POA: Diagnosis present

## 2013-01-06 LAB — COMPREHENSIVE METABOLIC PANEL
ALT: 185 U/L — ABNORMAL HIGH (ref 0–35)
AST: 113 U/L — ABNORMAL HIGH (ref 0–37)
CO2: 27 mEq/L (ref 19–32)
Calcium: 9.4 mg/dL (ref 8.4–10.5)
Chloride: 101 mEq/L (ref 96–112)
GFR calc non Af Amer: >90 mL/min (ref >90)
Sodium: 141 mEq/L (ref 135–145)

## 2013-01-06 LAB — CBC WITH DIFFERENTIAL/PLATELET
Basophils Absolute: 0 10*3/uL (ref 0.0–0.1)
Eosinophils Relative: 2 % (ref 0–5)
Lymphocytes Relative: 10 % — ABNORMAL LOW (ref 12–46)
Neutro Abs: 6.1 10*3/uL (ref 1.7–7.7)
Platelets: 216 10*3/uL (ref 150–400)
RDW: 14.9 % (ref 11.5–15.5)
WBC: 7.4 10*3/uL (ref 4.0–10.5)

## 2013-01-06 LAB — POCT I-STAT TROPONIN I

## 2013-01-06 MED ORDER — TADALAFIL (PAH) 20 MG PO TABS
20.0000 mg | ORAL_TABLET | Freq: Two times a day (BID) | ORAL | Status: DC
  Administered 2013-01-07 – 2013-01-08 (×3): 20 mg via ORAL
  Filled 2013-01-06: qty 1

## 2013-01-06 MED ORDER — GUAIFENESIN-DM 100-10 MG/5ML PO SYRP: 5.0000 mL | ORAL_SOLUTION | ORAL | Status: DC | PRN

## 2013-01-06 MED ORDER — POTASSIUM CHLORIDE ER 10 MEQ PO TBCR
10.0000 meq | EXTENDED_RELEASE_TABLET | Freq: Every day | ORAL | Status: DC
  Administered 2013-01-07 – 2013-01-08 (×2): 10 meq via ORAL
  Filled 2013-01-06 (×3): qty 1

## 2013-01-06 MED ORDER — HYDROCODONE-ACETAMINOPHEN 7.5-325 MG PO TABS
1.0000 | ORAL_TABLET | ORAL | Status: DC | PRN
  Administered 2013-01-06 – 2013-01-09 (×11): 1 via ORAL
  Filled 2013-01-06 (×14): qty 1

## 2013-01-06 MED ORDER — ENOXAPARIN SODIUM 40 MG/0.4ML ~~LOC~~ SOLN: 40.0000 mg | SUBCUTANEOUS | Status: DC

## 2013-01-06 MED ORDER — AZITHROMYCIN 500 MG PO TABS
500.0000 mg | ORAL_TABLET | Freq: Once | ORAL | Status: AC
  Administered 2013-01-06: 500 mg via ORAL
  Filled 2013-01-06: qty 1

## 2013-01-06 MED ORDER — FUROSEMIDE 10 MG/ML IJ SOLN
20.0000 mg | Freq: Once | INTRAMUSCULAR | Status: AC
  Administered 2013-01-06: 20 mg via INTRAVENOUS
  Filled 2013-01-06: qty 2

## 2013-01-06 MED ORDER — AMBRISENTAN 5 MG PO TABS
5.0000 mg | ORAL_TABLET | Freq: Every day | ORAL | Status: DC
  Administered 2013-01-07 – 2013-01-08 (×2): 5 mg via ORAL
  Filled 2013-01-06: qty 1

## 2013-01-06 MED ORDER — SODIUM CHLORIDE 0.9 % IJ SOLN: 3.0000 mL | INTRAMUSCULAR | Status: DC | PRN

## 2013-01-06 MED ORDER — SODIUM CHLORIDE 0.9 % IV SOLN: INTRAVENOUS | Status: AC

## 2013-01-06 MED ORDER — SODIUM CHLORIDE 0.9 % IV SOLN
250.0000 mL | INTRAVENOUS | Status: DC | PRN
  Administered 2013-01-06: 250 mL via INTRAVENOUS

## 2013-01-06 MED ORDER — ENOXAPARIN SODIUM 40 MG/0.4ML ~~LOC~~ SOLN
40.0000 mg | SUBCUTANEOUS | Status: DC
  Administered 2013-01-07 – 2013-01-08 (×2): 40 mg via SUBCUTANEOUS
  Filled 2013-01-06 (×4): qty 0.4

## 2013-01-06 MED ORDER — TADALAFIL (PAH) 20 MG PO TABS: 20.0000 mg | ORAL_TABLET | Freq: Two times a day (BID) | ORAL | Status: DC

## 2013-01-06 MED ORDER — ALPRAZOLAM 0.25 MG PO TABS: 0.2500 mg | ORAL_TABLET | Freq: Two times a day (BID) | ORAL | Status: DC | PRN

## 2013-01-06 MED ORDER — FLUOXETINE HCL 20 MG PO CAPS
20.0000 mg | ORAL_CAPSULE | Freq: Every morning | ORAL | Status: DC
  Administered 2013-01-07 – 2013-01-08 (×2): 20 mg via ORAL
  Filled 2013-01-06 (×3): qty 1

## 2013-01-06 MED ORDER — ACETAMINOPHEN 325 MG PO TABS: 650.0000 mg | ORAL_TABLET | Freq: Four times a day (QID) | ORAL | Status: DC | PRN

## 2013-01-06 MED ORDER — SODIUM CHLORIDE 0.9 % IJ SOLN
3.0000 mL | Freq: Two times a day (BID) | INTRAMUSCULAR | Status: DC
  Administered 2013-01-06 – 2013-01-07 (×2): 3 mL via INTRAVENOUS

## 2013-01-06 MED ORDER — LISINOPRIL 5 MG PO TABS
5.0000 mg | ORAL_TABLET | Freq: Every day | ORAL | Status: DC
  Administered 2013-01-06 – 2013-01-08 (×3): 5 mg via ORAL
  Filled 2013-01-06 (×4): qty 1

## 2013-01-06 MED ORDER — HYDROCODONE-ACETAMINOPHEN 7.5-325 MG PO TABS: 1.0000 | ORAL_TABLET | Freq: Four times a day (QID) | ORAL | Status: DC | PRN

## 2013-01-06 MED ORDER — NORGESTREL-ETHINYL ESTRADIOL 0.3-30 MG-MCG PO TABS: 1.0000 | ORAL_TABLET | Freq: Every day | ORAL | Status: DC

## 2013-01-06 MED ORDER — SODIUM CHLORIDE 0.9 % IJ SOLN
3.0000 mL | Freq: Two times a day (BID) | INTRAMUSCULAR | Status: DC
  Administered 2013-01-07 – 2013-01-08 (×3): 3 mL via INTRAVENOUS

## 2013-01-06 MED ORDER — DEXTROSE 5 % IV SOLN
1.0000 g | Freq: Once | INTRAVENOUS | Status: AC
  Administered 2013-01-06: 1 g via INTRAVENOUS
  Filled 2013-01-06: qty 10

## 2013-01-06 MED ORDER — HYDROCODONE-ACETAMINOPHEN 5-325 MG PO TABS
2.0000 | ORAL_TABLET | Freq: Once | ORAL | Status: AC
  Administered 2013-01-06: 2 via ORAL
  Filled 2013-01-06: qty 2

## 2013-01-06 MED ORDER — ALBUTEROL SULFATE (5 MG/ML) 0.5% IN NEBU
2.5000 mg | INHALATION_SOLUTION | Freq: Four times a day (QID) | RESPIRATORY_TRACT | Status: DC
  Administered 2013-01-06: 2.5 mg via RESPIRATORY_TRACT
  Filled 2013-01-06: qty 0.5

## 2013-01-06 MED ORDER — FUROSEMIDE 10 MG/ML IJ SOLN: 40.0000 mg | Freq: Two times a day (BID) | INTRAMUSCULAR | Status: DC

## 2013-01-06 MED ORDER — HYDROCODONE-ACETAMINOPHEN 5-325 MG PO TABS: 1.5000 | ORAL_TABLET | Freq: Four times a day (QID) | ORAL | Status: DC | PRN

## 2013-01-06 MED ORDER — ALBUTEROL SULFATE (5 MG/ML) 0.5% IN NEBU
2.5000 mg | INHALATION_SOLUTION | RESPIRATORY_TRACT | Status: DC | PRN
  Administered 2013-01-08: 2.5 mg via RESPIRATORY_TRACT

## 2013-01-06 MED ORDER — AZITHROMYCIN 500 MG PO TABS
500.0000 mg | ORAL_TABLET | Freq: Every day | ORAL | Status: DC
  Administered 2013-01-07 – 2013-01-08 (×2): 500 mg via ORAL
  Filled 2013-01-06 (×3): qty 1

## 2013-01-06 MED ORDER — SODIUM CHLORIDE 0.9 % IV SOLN: Freq: Once | INTRAVENOUS | Status: DC

## 2013-01-06 MED ORDER — DEXTROSE 5 % IV SOLN
1.0000 g | INTRAVENOUS | Status: DC
  Administered 2013-01-07: 1 g via INTRAVENOUS
  Filled 2013-01-06 (×2): qty 10

## 2013-01-06 MED ORDER — FUROSEMIDE 10 MG/ML IJ SOLN
40.0000 mg | Freq: Two times a day (BID) | INTRAMUSCULAR | Status: DC
  Administered 2013-01-07 – 2013-01-08 (×4): 40 mg via INTRAVENOUS
  Filled 2013-01-06 (×7): qty 4

## 2013-01-06 MED ORDER — TADALAFIL 20 MG PO TABS
20.0000 mg | ORAL_TABLET | Freq: Two times a day (BID) | ORAL | Status: DC
  Filled 2013-01-06: qty 1

## 2013-01-06 MED ORDER — ACETAMINOPHEN 650 MG RE SUPP: 650.0000 mg | Freq: Four times a day (QID) | RECTAL | Status: DC | PRN

## 2013-01-06 NOTE — ED Notes (Signed)
WALKED PATIENT AROUND ER WITH  OXYGEN @ 3 LITERS. SPO2 = 94%.. AT END OF WALK PATIENT OXYGEN SATURATION = 91%  IN ABOUT 15-20 SECONDS  SATURATION = 96% WITH OXYGEN  @ 3 LITERS. PATIENTS RESPIRATORY EFFORT NORMAL NO DISTRESS. REPORTED SAME TO P.A. AND NURSE.

## 2013-01-06 NOTE — ED Notes (Signed)
Pt was seen here last week and told she has PNA and now with chest and back pain and symptoms worse.  Pt is on home O2 for pulmonary htn at 3L.  Sats 95% at triage

## 2013-01-06 NOTE — H&P (Addendum)
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 Patient was seen in the ED about a week back with symptoms of her chest of breath with cough and a small amount of hemoptysis. She had a CT and you done at that time which was unremarkable for PE but with patchy bilateral infiltrate. She was discharged home with plan for antibiotic however did not receive any prescriptions at that time. She informs that her shortness of breath did not improve on home O2 and she was progressively dyspneic at rest associated with cough. She did not have any further hemoptysis. She denies any fever however had subjective chills. She denies any orthopnea however did wake up with shortness of breath overnight. She also noticed some swelling in her legs for past 2 days. She denies any sick contacts, nausea, vomiting, abdominal pain, palpitations, chest pain, or urinary symptoms. Denies any URI symptoms.  In the ED a chest x-ray was obtained which showed worsening bilateral airspace disease with small left-sided pleural effusion. A proBNP was elevated to 2500. Patient is on 3 L via nasal cannula at home and follows at [facility] for her pulmonary hypertension. In the ED patient was given 20 mg IV Lasix and [facility] called for admission to medical floor for pneumonia with underlying CHF symptoms. On my evaluation patient informs that her symptoms of shortness of breath are slightly better at this time.  Review of Systems:  Constitutional: Subjective chills . Denies fever, diaphoresis, appetite  change and fatigue.  HEENT: Denies photophobia, eye pain, redness, hearing loss, ear pain, congestion, sore throat, rhinorrhea, sneezing, mouth sores, trouble swallowing, neck pain, neck stiffness and tinnitus.   Respiratory:  SOB, DOE, cough and wheezing, denies orthopnea but PND present Cardiovascular: Leg swellings present, Denies chest pain, palpitations. Gastrointestinal: Denies nausea, vomiting, abdominal pain, diarrhea, constipation, blood in stool and abdominal distention.  Genitourinary: Denies dysuria, urgency, frequency, hematuria, flank pain and difficulty urinating.  Musculoskeletal: Denies myalgias, back pain, joint swelling, arthralgias and gait problem.  Skin: Denies pallor, rash and wound.  Neurological: Denies dizziness, seizures, syncope, weakness, light-headedness, numbness and headaches.  Hematological: Denies adenopathy. Easy bruising, personal or family bleeding history  Psychiatric/Behavioral: Denies suicidal ideation, mood changes, confusion, nervousness, sleep disturbance and agitation   Past Medical History  Diagnosis Date  . Back pain   . GERD (gastroesophageal reflux disease)   . Asthma     As a child  . Scoliosis deformity of spine   . Anxiety   . Tetralogy of Fallot   . Pulmonary hypertension   . Heart failure    Past Surgical History  Procedure Date  . Tetralogy of fallot repair     Age 31 at [facility]  . Blalock procedure     3 months  . Back surgery     Rods for scoliosis   Social History:  reports that she quit smoking about 23 months ago. Her smoking use included Cigarettes. She has a 15 pack-year smoking history. She has never used smokeless tobacco. She reports that she drinks alcohol. She reports that she does not use illicit  drugs.  Allergies  Allergen Reactions  . Avelox (Moxifloxacin Hcl In Nacl)     Hives itching    Family History  Problem Relation Age of Onset  . Diabetes Mother   . Hypertension Mother   . Cancer Mother     uterine  cancer  . Alcohol abuse Father   . Cancer Maternal Aunt     Breast Cancer, Lung cancer    Prior to Admission medications   Medication Sig Start Date End Date Taking? Authorizing Provider  ALPRAZolam (XANAX) 0.25 MG tablet Take 0.25-0.5 mg by mouth 2 (two) times daily as needed. For anxiety   Yes Historical Provider, MD  ambrisentan (LETAIRIS) 5 MG tablet Take 5 mg by mouth daily.   Yes Historical Provider, MD  FLUoxetine (PROZAC) 20 MG capsule Take 20 mg by mouth every morning.   Yes Historical Provider, MD  furosemide (LASIX) 40 MG tablet Take 40 mg by mouth daily.   Yes Historical Provider, MD  HYDROcodone-acetaminophen (NORCO) 7.5-325 MG per tablet Take 1 tablet by mouth every 4 (four) hours as needed for pain. 11/25/12  Yes Jacques Navy, MD  norgestrel-ethinyl estradiol (LO/OVRAL) 0.3-30 MG-MCG tablet Take 1 tablet by mouth daily. 08/22/12 08/22/13 Yes Jacques Navy, MD  potassium chloride (K-DUR) 10 MEQ tablet Take 10 mEq by mouth daily.   Yes Historical Provider, MD  Tadalafil, PAH, 20 MG TABS Take 1 tablet (20 mg total) by mouth 2 (two) times daily. 06/25/12  Yes Jacques Navy, MD    Physical Exam:  Filed Vitals:   01/06/13 1247 01/06/13 1830  BP: 123/39 142/65  Pulse: 88 85  Temp: 99 F (37.2 C) 98.2 F (36.8 C)  TempSrc: Oral Oral  Resp: 22 28  SpO2: 97% 96%    Constitutional: Vital signs reviewed.  Patient is a well-developed and well-nourished in no acute distress and cooperative with exam. Alert and oriented x3.  Head: Normocephalic and atraumatic Ear: TM normal bilaterally Mouth: no erythema or exudates, MMM Eyes: PERRL, EOMI, conjunctivae normal, No scleral icterus.  Neck: Supple, Trachea midline normal ROM, No JVD, mass, thyromegaly, or carotid bruit present.  Cardiovascular: RRR, S1 normal, S2 normal, 3 /6 systolic murmur., pulses symmetric and intact bilaterally Pulmonary/Chest: Diminished breath sounds at lung bases,, no wheezes, rales, or  rhonchi Abdominal: Soft. Non-tender, non-distended, bowel sounds are normal, no masses, organomegaly, or guarding present.  GU: no CVA tenderness Musculoskeletal: No joint deformities, erythema, or stiffness, ROM full and no nontender Ext: 1+ pitting edema bilaterally, no cyanosis, pulses palpable bilaterally (DP and PT) Hematology: no cervical, inginal, or axillary adenopathy.  Neurological: A&O x3, Strenght is normal and symmetric bilaterally, cranial nerve II-XII are grossly intact, no focal motor deficit, sensory intact to light touch bilaterally.  Skin: Warm, dry and intact. No rash, cyanosis, or clubbing.  Psychiatric: Normal mood and affect. speech and behavior is normal. Judgment and thought content normal. Cognition and memory are normal.   Labs on Admission:  Basic Metabolic Panel:  Lab 01/06/13 9604  NA 141  K 3.8  CL 101  CO2 27  GLUCOSE 106*  BUN 8  CREATININE 0.53  CALCIUM 9.4  MG --  PHOS --   Liver Function Tests:  Lab 01/06/13 1251  AST 113*  ALT 185*  ALKPHOS 70  BILITOT 1.5*  PROT 7.6  ALBUMIN 4.0   No results found for this basename: LIPASE:5,AMYLASE:5 in the last 168 hours No results found for this basename: AMMONIA:5 in the last 168  hours CBC:  Lab 01/06/13 1251  WBC 7.4  NEUTROABS 6.1  HGB 11.0*  HCT 34.2*  MCV 88.8  PLT 216   Cardiac Enzymes: No results found for this basename: CKTOTAL:5,CKMB:5,CKMBINDEX:5,TROPONINI:5 in the last 168 hours BNP: No components found with this basename: POCBNP:5 CBG: No results found for this basename: GLUCAP:5 in the last 168 hours  Radiological Exams on Admission: Dg Chest 2 View  01/06/2013  *RADIOLOGY REPORT*  Clinical Data: Chest pain  CHEST - 2 VIEW  Comparison: 12/28/2012  Findings: Thoracolumbar stabilization hardware is in place and is stable.  The heart remains moderately enlarged.  Bilateral central and basilar airspace opacities are worse.  No pneumothorax.   Small left pleural effusion has  developed.  Linear density obliquely oriented in the right lower lung zone is of unknown significance and is stable.  IMPRESSION: Worsening bilateral airspace disease.  Left pleural effusion.   Original Report Authenticated By: Jolaine Click, M.D.     EKG: Normal sinus rhythm with atrial enlargement. Prolonged QTC  Assessment/Plan Principal Problem:  *Community acquired pneumonia And medical floor. -Will start her on antibiotic coverage with IV Rocephin and azithromycin. Monitor for QTC prolongation. -Continue O2 via nasal cannula at 3 L per minute. Patient is maintaining her O2 sat at this time. We will continue her on albuterol nebs as needed. Check blood culture, sputum culture, urine for strep and Legionella antigen. -Place and antitussives. -Patient does not have further hemoptysis and hemoglobin is stable. She does inform that she had episodes of hemoptysis with worsening shortness of breath in the past.  Active Problems:   CHF (congestive heart failure) in the setting of pulmonary hypertension with right heart failure -place on telemetry -Patient does have symptoms of CHF which is new. A 2-D echo August 2013 shows EF greater than 55% but with ordered RV dysfunction and severe PR. Pro BNP is elevated Patient is on daily Lasix at home. I will place her on IV Lasix 40 mg twice a day. Check I.'s and O.'s and daily weight. -continue on tadalafil and ambrisenten   GERD Place on  Protonix  DVT prophylaxis: Subcutaneous Lovenox Diet: Cardiac  Code Status: Full code Family Communication: Family at bedside was updated plan Disposition Plan: Home once stable  Eddie North [facility] Pager [phone]  If 7PM-7AM, please contact night-coverage [email or web address] Password [email or web address] 01/06/2013, 8:40 PM    Total time spent on admission: 70 minutes   Will leave a message to eagle at [facility] for patient to be followed from 1/14

## 2013-01-06 NOTE — ED Provider Notes (Signed)
History     CSN: [id number]  Arrival date & time 01/06/13  1203   First MD Initiated Contact with Patient 01/06/13 1617      Chief Complaint  Patient presents with  . Chest Pain    (Consider location/radiation/quality/duration/timing/severity/associated sxs/prior treatment) Patient is a 27 y.o. female presenting with cough. The history is provided by the patient and the spouse.  Cough This is a new problem. The current episode started more than 1 week ago. The cough is productive of sputum. Associated symptoms include chest pain, chills and myalgias. Pertinent negatives include no shortness of breath. Associated symptoms comments: Chest tightness, productive cough and exertional dyspnea for the past 10 days. She has chills without known fever. History significant for Tetrology (repaired age 8), Pulmonary Hypertension, O2 dependence, seen here on 12/29/11 and diagnosed, per patient, with pneumonia. At that time she had hemoptysis that has since resolved. No N, V. She relates her chest discomfort to cough and SOB..    Past Medical History  Diagnosis Date  . Back pain   . GERD (gastroesophageal reflux disease)   . Asthma     As a child  . Scoliosis deformity of spine   . Anxiety   . Tetralogy of Fallot   . Pulmonary hypertension   . Heart failure     Past Surgical History  Procedure Date  . Tetralogy of fallot repair     Age 18 at [facility]  . Blalock procedure     3 months  . Back surgery     Rods for scoliosis    Family History  Problem Relation Age of Onset  . Diabetes Mother   . Hypertension Mother   . Cancer Mother     uterine cancer  . Alcohol abuse Father   . Cancer Maternal Aunt     Breast Cancer, Lung cancer    History  Substance Use Topics  . Smoking status: Former Smoker -- 1.5 packs/day for 10 years    Types: Cigarettes    Quit date: 02/02/2011  . Smokeless tobacco: Never Used  . Alcohol Use: Yes    OB History    Grav Para Term Preterm Abortions TAB SAB  Ect Mult Living                  Review of Systems  Constitutional: Positive for chills and appetite change. Negative for fever.  HENT: Negative for congestion and sinus pressure.   Eyes: Negative for discharge.  Respiratory: Positive for cough and chest tightness. Negative for shortness of breath.   Cardiovascular: Positive for chest pain.  Gastrointestinal: Negative for nausea, vomiting and abdominal pain.  Genitourinary: Negative for dysuria.  Musculoskeletal: Positive for myalgias.  Skin: Negative for rash.  Neurological: Negative for syncope and weakness.  Psychiatric/Behavioral: Negative for confusion.    Allergies  Avelox  Home Medications   Current Outpatient Rx  Name  Route  Sig  Dispense  Refill  . ALPRAZOLAM 0.25 MG PO TABS   Oral   Take 0.25-0.5 mg by mouth 2 (two) times daily as needed. For anxiety         . AMBRISENTAN 5 MG PO TABS   Oral   Take 5 mg by mouth daily.         Marland Kitchen FLUOXETINE HCL 20 MG PO CAPS   Oral   Take 20 mg by mouth every morning.         . FUROSEMIDE 40 MG PO TABS   Oral  Take 40 mg by mouth daily.         Marland Kitchen HYDROCODONE-ACETAMINOPHEN 7.5-325 MG PO TABS   Oral   Take 1 tablet by mouth every 4 (four) hours as needed for pain.   150 tablet   3   . NORGESTREL-ETHINYL ESTRADIOL 0.3-30 MG-MCG PO TABS   Oral   Take 1 tablet by mouth daily.   1 Package   11   . POTASSIUM CHLORIDE ER 10 MEQ PO TBCR   Oral   Take 10 mEq by mouth daily.         Marland Kitchen TADALAFIL (PAH) 20 MG PO TABS   Oral   Take 1 tablet (20 mg total) by mouth 2 (two) times daily.   60 tablet        BP 123/39  Pulse 88  Temp 99 F (37.2 C) (Oral)  Resp 22  SpO2 97%  LMP 12/03/2012  Physical Exam  Constitutional: She is oriented to person, place, and time. She appears well-developed and well-nourished. No distress.  HENT:  Head: Normocephalic.  Eyes: Conjunctivae normal are normal.  Neck: Normal range of motion. Neck supple.  Cardiovascular:  Normal rate and regular rhythm.   No murmur heard. Pulmonary/Chest: Effort normal. She has wheezes. She has rales. She exhibits no tenderness.       Diminished breath sounds left lower lobes.  Abdominal: Soft. Bowel sounds are normal. There is no tenderness. There is no rebound and no guarding.  Musculoskeletal: Normal range of motion. She exhibits no edema.  Neurological: She is alert and oriented to person, place, and time.  Skin: Skin is warm and dry. No rash noted.  Psychiatric: She has a normal mood and affect.    ED Course  Procedures (including critical care time)  Labs Reviewed  CBC WITH DIFFERENTIAL - Abnormal; Notable for the following:    RBC 3.85 (*)     Hemoglobin 11.0 (*)     HCT 34.2 (*)     Neutrophils Relative 82 (*)     Lymphocytes Relative 10 (*)     All other components within normal limits  COMPREHENSIVE METABOLIC PANEL - Abnormal; Notable for the following:    Glucose, Bld 106 (*)     AST 113 (*)     ALT 185 (*)     Total Bilirubin 1.5 (*)     All other components within normal limits  PRO B NATRIURETIC PEPTIDE - Abnormal; Notable for the following:    Pro B Natriuretic peptide (BNP) 2536.0 (*)     All other components within normal limits  POCT I-STAT TROPONIN I   Dg Chest 2 View  01/06/2013  *RADIOLOGY REPORT*  Clinical Data: Chest pain  CHEST - 2 VIEW  Comparison: 12/28/2012  Findings: Thoracolumbar stabilization hardware is in place and is stable.  The heart remains moderately enlarged.  Bilateral central and basilar airspace opacities are worse.  No pneumothorax.   Small left pleural effusion has developed.  Linear density obliquely oriented in the right lower lung zone is of unknown significance and is stable.  IMPRESSION: Worsening bilateral airspace disease.  Left pleural effusion.   Original Report Authenticated By: Jolaine Click, M.D.      No diagnosis found.  1. chf   MDM  Exertional dyspnea without significant hypoxia. Review of records  shows comparatively worsening vascular congestion now with new left pleural effusion. BNP greater than 2500 without history of CHF. DDx - PNA vs new onset CHF. Admit to [facility].  Arnoldo Hooker, PA-C 01/09/13 2115

## 2013-01-07 DIAGNOSIS — Z8679 Personal history of other diseases of the circulatory system: Secondary | ICD-10-CM

## 2013-01-07 LAB — COMPREHENSIVE METABOLIC PANEL
ALT: 162 U/L — ABNORMAL HIGH (ref 0–35)
AST: 91 U/L — ABNORMAL HIGH (ref 0–37)
Albumin: 3.7 g/dL (ref 3.5–5.2)
Calcium: 9.1 mg/dL (ref 8.4–10.5)
Sodium: 141 mEq/L (ref 135–145)
Total Protein: 7.2 g/dL (ref 6.0–8.3)

## 2013-01-07 LAB — LEGIONELLA ANTIGEN, URINE: Legionella Antigen, Urine: NEGATIVE

## 2013-01-07 LAB — CBC
HCT: 32.3 % — ABNORMAL LOW (ref 36.0–46.0)
Hemoglobin: 10.3 g/dL — ABNORMAL LOW (ref 12.0–15.0)
MCH: 28.3 pg (ref 26.0–34.0)
MCHC: 31.9 g/dL (ref 30.0–36.0)

## 2013-01-07 LAB — HIV ANTIBODY (ROUTINE TESTING W REFLEX): HIV: NONREACTIVE

## 2013-01-07 MED ORDER — ALBUTEROL SULFATE (5 MG/ML) 0.5% IN NEBU
2.5000 mg | INHALATION_SOLUTION | Freq: Two times a day (BID) | RESPIRATORY_TRACT | Status: DC
  Administered 2013-01-07 – 2013-01-08 (×3): 2.5 mg via RESPIRATORY_TRACT
  Filled 2013-01-07 (×4): qty 0.5

## 2013-01-07 NOTE — Progress Notes (Signed)
  Echocardiogram 2D Echocardiogram has been performed.  Ellender Hose A 01/07/2013, 11:30 AM

## 2013-01-07 NOTE — Progress Notes (Signed)
Subjective: [name] is admitted with CAP that failed outpatient therapy. She also has chemical evidence of mild CHF with elevation in BNP. She reports that she is filling a little better this morning. Denies any hemoptysis, is not particularly short of breath. Objective: Lab: Lab Results  Component Value Date   WBC 7.7 01/07/2013   HGB 10.3* 01/07/2013   HCT 32.3* 01/07/2013   MCV 88.7 01/07/2013   PLT 229 01/07/2013   BMET    Component Value Date/Time   NA 141 01/07/2013 0525   K 3.8 01/07/2013 0525   CL 100 01/07/2013 0525   CO2 28 01/07/2013 0525   GLUCOSE 93 01/07/2013 0525   BUN 9 01/07/2013 0525   CREATININE 0.56 01/07/2013 0525   CALCIUM 9.1 01/07/2013 0525   GFRNONAA >90 01/07/2013 0525   GFRAA >90 01/07/2013 0525   BNP 2536   Imaging: CXR 01/06/13: Findings: Thoracolumbar stabilization hardware is in place and is  stable. The heart remains moderately enlarged. Bilateral central  and basilar airspace opacities are worse. No pneumothorax. Small  left pleural effusion has developed. Linear density obliquely  oriented in the right lower lung zone is of unknown significance  and is stable.  IMPRESSION:  Worsening bilateral airspace disease. Left pleural effusion   Scheduled Meds:   . sodium chloride   Intravenous Once  . sodium chloride   Intravenous STAT  . albuterol  2.5 mg Nebulization BID  . ambrisentan  5 mg Oral Daily  . azithromycin  500 mg Oral Daily  . cefTRIAXone (ROCEPHIN)  IV  1 g Intravenous Q24H  . enoxaparin (LOVENOX) injection  40 mg Subcutaneous Q24H  . FLUoxetine  20 mg Oral q morning - 10a  . furosemide  40 mg Intravenous BID  . lisinopril  5 mg Oral Daily  . potassium chloride  10 mEq Oral Daily  . sodium chloride  3 mL Intravenous Q12H  . sodium chloride  3 mL Intravenous Q12H  . Tadalafil (PAH)  20 mg Oral BID   Continuous Infusions:  PRN Meds:.sodium chloride, acetaminophen, acetaminophen, albuterol, ALPRAZolam, guaiFENesin-dextromethorphan,  HYDROcodone-acetaminophen, sodium chloride   Physical Exam: Filed Vitals:   01/07/13 0527  BP: 135/57  Pulse: 83  Temp: 98.3 F (36.8 C)  Resp: 19   gen'l- obese white woman in no distress Cor - RRR Pulm - diminished breath sounds, no rales or wheezes, no increased WOB     Assessment/Plan: 1. PUlm/ID - CAP day 2 Azithromycin/Rocephin Plan Continue o2  Continue antibiotics  2. Cardiac - severe pulmonary HTN on tadalifil Mild CHF with no radiographic changes but elevated BNP. 2 D echo pending. Plan - d/c/ telemetry  Continue lasix.   [name] [facility] (o) [phone]; (c) [phone] Call-grp - [name]  Tele: [phone]  01/07/2013, 8:05 AM

## 2013-01-07 NOTE — Progress Notes (Signed)
Utilization Review Completed Cybele Maule, RN, BSN, NCM 

## 2013-01-08 ENCOUNTER — Inpatient Hospital Stay (HOSPITAL_COMMUNITY): Payer: Medicaid Other

## 2013-01-08 LAB — CBC WITH DIFFERENTIAL/PLATELET
Basophils Relative: 0 % (ref 0–1)
Eosinophils Absolute: 0.3 10*3/uL (ref 0.0–0.7)
Eosinophils Relative: 6 % — ABNORMAL HIGH (ref 0–5)
Hemoglobin: 11.4 g/dL — ABNORMAL LOW (ref 12.0–15.0)
MCH: 29.1 pg (ref 26.0–34.0)
MCHC: 32.4 g/dL (ref 30.0–36.0)
MCV: 89.8 fL (ref 78.0–100.0)
Monocytes Relative: 10 % (ref 3–12)
Neutrophils Relative %: 56 % (ref 43–77)

## 2013-01-08 LAB — BASIC METABOLIC PANEL
BUN: 12 mg/dL (ref 6–23)
BUN: 16 mg/dL (ref 6–23)
Calcium: 9.3 mg/dL (ref 8.4–10.5)
Calcium: 9.3 mg/dL (ref 8.4–10.5)
Creatinine, Ser: 0.79 mg/dL (ref 0.50–1.10)
GFR calc Af Amer: >90 mL/min (ref >90)
GFR calc non Af Amer: >90 mL/min (ref >90)
GFR calc non Af Amer: >90 mL/min (ref >90)
Glucose, Bld: 98 mg/dL (ref 70–99)
Potassium: 3.9 mEq/L (ref 3.5–5.1)

## 2013-01-08 MED ORDER — CEFUROXIME AXETIL 250 MG PO TABS
250.0000 mg | ORAL_TABLET | Freq: Two times a day (BID) | ORAL | Status: DC
  Administered 2013-01-08 – 2013-01-09 (×2): 250 mg via ORAL
  Filled 2013-01-08 (×5): qty 1

## 2013-01-08 NOTE — Progress Notes (Signed)
Subjective: [name] slept well. She reports that her breathing is OK. Minor wheezing. NO chest pain.  Objective: Lab: Lab Results  Component Value Date   WBC 7.7 01/07/2013   HGB 10.3* 01/07/2013   HCT 32.3* 01/07/2013   MCV 88.7 01/07/2013   PLT 229 01/07/2013   BMET    Component Value Date/Time   NA 141 01/07/2013 0525   K 3.8 01/07/2013 0525   CL 100 01/07/2013 0525   CO2 28 01/07/2013 0525   GLUCOSE 93 01/07/2013 0525   BUN 9 01/07/2013 0525   CREATININE 0.56 01/07/2013 0525   CALCIUM 9.1 01/07/2013 0525   GFRNONAA >90 01/07/2013 0525   GFRAA >90 01/07/2013 0525     Imaging: 2 D echo 01/07/13: Study Conclusions  - Left ventricle: Systolic function was normal. The estimated ejection fraction was in the range of 55% to 60%. Left ventricular diastolic function parameters were normal. - Ventricular septum: The contour showed systolic flattening. - Right ventricle: The cavity size was mildly dilated. Systolic function was normal. - Right atrium: The atrium was mildly dilated. - Tricuspid valve: Moderate regurgitation. - Pulmonary arteries: PA peak pressure: 77mm Hg (S). Severe pulmonary hypertension. - Systemic veins: The IVC measures <2.1 cm but does not collapse >50%, suggesting a high RA pressure of 10 mmHg.   Scheduled Meds:   . sodium chloride   Intravenous Once  . albuterol  2.5 mg Nebulization BID  . ambrisentan  5 mg Oral Daily  . azithromycin  500 mg Oral Daily  . cefTRIAXone (ROCEPHIN)  IV  1 g Intravenous Q24H  . enoxaparin (LOVENOX) injection  40 mg Subcutaneous Q24H  . FLUoxetine  20 mg Oral q morning - 10a  . furosemide  40 mg Intravenous BID  . lisinopril  5 mg Oral Daily  . potassium chloride  10 mEq Oral Daily  . sodium chloride  3 mL Intravenous Q12H  . sodium chloride  3 mL Intravenous Q12H  . Tadalafil (PAH)  20 mg Oral BID   Continuous Infusions:  PRN Meds:.sodium chloride, acetaminophen, acetaminophen, albuterol, ALPRAZolam,  guaiFENesin-dextromethorphan, HYDROcodone-acetaminophen, sodium chloride   Physical Exam: Filed Vitals:   01/08/13 0514  BP: 122/47  Pulse: 82  Temp: 98.2 F (36.8 C)  Resp: 18  Gen'l- overweight white woman in no distress HEENT- C&S clear Cor - RRR Pulm - no increased work of breathing. Decreased breath sounds in general, no wheezing Neuro - A&O x 3      Assessment/Plan: 1. Pulm/ID - Day #3/3 Azithromycin, #3/3 Rocephin Plan Will convert to ceftin PO  F/u CBCD, CXR  2. Cardiac - no clinical evidence of CHF or decompensation. 2 D echo reviewed - good EF Plan F/u BNP   [employer] [facility] (o) [phone]; (c) [phone] Call-grp - [name]  Tele: [phone]  01/08/2013, 7:19 AM

## 2013-01-08 NOTE — Plan of Care (Signed)
Problem: Phase I Progression Outcomes Goal: EF % per last Echo/documented,Core Reminder form on chart Outcome: Completed/Met Date Met:  01/08/13 Echo done 01/07/13 EF 50 to 60 %

## 2013-01-08 NOTE — Plan of Care (Signed)
Problem: Phase III Progression Outcomes Goal: Dyspnea controlled with activity Outcome: Completed/Met Date Met:  01/08/13 Is 02 dependent at home

## 2013-01-09 LAB — BASIC METABOLIC PANEL
BUN: 17 mg/dL (ref 6–23)
Calcium: 9.4 mg/dL (ref 8.4–10.5)
Creatinine, Ser: 0.58 mg/dL (ref 0.50–1.10)
GFR calc Af Amer: >90 mL/min (ref >90)
GFR calc non Af Amer: >90 mL/min (ref >90)
Glucose, Bld: 97 mg/dL (ref 70–99)
Potassium: 4.3 mEq/L (ref 3.5–5.1)

## 2013-01-09 MED ORDER — CEFUROXIME AXETIL 250 MG PO TABS: 250.0000 mg | ORAL_TABLET | Freq: Two times a day (BID) | ORAL | Status: DC

## 2013-01-09 NOTE — Discharge Summary (Signed)
[name], [name]  MEDICAL RECORD NO.:  [id number]  LOCATION:  4711                         FACILITY:  [facility]  PHYSICIAN:  Rosalyn Gess. Norins, MD  DATE OF BIRTH:  [birth year]  DATE OF ADMISSION:  01/06/2013 DATE OF DISCHARGE:  01/09/2013                              DISCHARGE SUMMARY   ADMITTING DIAGNOSES: 1. Community-acquired pneumonia. 2. Mild congestive heart failure. 3. 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 4. Gastroesophageal reflux disease.  DISCHARGE DIAGNOSES: 1. Community-acquired pneumonia. 2. Mild congestive heart failure. 3. 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 4. Gastroesophageal reflux disease.  PROCEDURES: 1. Imaging December 29, 2011, chest x-ray which showed enlargement of     cardiac silhouette with chronic accentuation of perihilar markings     unchanged from previous exam.  No definite infiltrate noted. 2. CT angio chest performed December 28, 2012 which showed no evidence     of pulmonary embolism.  Bilateral patchy airspace disease     compatible with pulmonary edema but infection cannot be excluded.     Postsurgical changes with unusual chest anatomy with absence of     left pulmonary artery.  Enlargement and bilobed appearance of the     main right pulmonary artery.  Enlargement of right atrium and     hepatic veins. 3. Chest x-ray January 06, 2013 day of admission which showed     worsening bilateral airspace disease and bilateral pleural     effusions. 4. Chest x-ray 1:15 p.m. read out as showing slight progression of     hazy infiltrate in the right lung.  Stable small infiltrate in the     left lung.  HISTORY OF PRESENT ILLNESS:  [name] is a 27 year old with a history of repair of Tetralogy of Fallot, missing of left  pulmonary artery who has severe pulmonary hypertension with right-sided heart failure established by echocardiogram in the past.  The patient also with a history of significant hemoptysis secondary to pulmonary hypertension.  The patient also with a history of GERD.  The patient presented to the emergency department on December 28, 2012, with progressive shortness of breath.  She was thought at that time for CT to have patchy bilateral infiltrates with no PE.  She was discharged home with a plan for antibiotic therapy, however she was not given any prescriptions.  The patient reports no shortness of breath, did not improve and had progressive dyspnea at rest along with cough.  She had hemoptysis on December 28, 2012, but no further hemoptysis.  She denies any fevers at home but has had subjective chills.  She also noticed some mild peripheral edema.  In the emergency department, chest x-ray showed worsening bilateral airspace disease, with a small left-sided pleural effusion.  BNP was mildly elevated at 2500.  The patient is on 3 L of oxygen at home.  The patient was given 20 mg of IV Lasix in the hospital and subsequently admitted for treatment of her community-acquired pneumonia and mild CHF symptoms.  Please see the H and P for  past medical history, family history, social history, and admission exam. Please see additional epic notes to flush out her history.  HOSPITAL COURSE:  The patient was admitted to a telemetry unit. 1. Pulmonary/ID.  The patient was started on azithromycin 500 mg daily     and completed 3 of 3 doses.  She was started on Rocephin 1 g q.24     and completed 4 days and was switched to Ceftin.  The patient's     symptoms improved.  She reports decreased shortness of breath.  She     had no persistent cough or productive sputum.  She remained     afebrile.  Her white blood count which was 7.7 at admission dropped     to 5.7 with treatment.  The patient's differential at  admission     revealed 82% segs, 10% lymphocytes, 6% monos, 2% eos.  Final     differential was 56% segs, 20% lymphs, 10% monos, 6% eosinophils.     With the patient's symptoms being improved with her tolerating oral     antibiotics with oxygen saturations being adequate and with     differential being normal, the patient is felt to be ready for     discharge home to complete oral antibiotics. 2. CHF.  The patient with significant pulmonary hypertension and right-     sided heart failure.  She had a minimally elevated BNP at the time     of admission.  She was given IV Lasix for diuresis.  The patient     did have a repeat echocardiogram performed January 07, 2013, which     showed left ventricle with normal systolic function with an     estimated ejection fraction of 55-60%.  Diastolic function     parameters were normal.  The ventricular septum showed systolic     flattening.  Right ventricle cavity size was mildly dilated.     Systolic function was normal.  Right atrium was mildly dilated.     Tricuspid valve with moderate regurgitation.  Pulmonary artery with     PA peak pressure 77 mmHg consistent with severe pulmonary     hypertension.  The IVC measured less than 2.1 cm but does not     collapse greater than 50%, suggesting a high RA pressure of 10 mmHg     or more.  The patient did diurese initially but her overall balance     time of discharge was positive at 160 mL.  The patient's final     weight on January 09, 2013, was 170 pounds down from 174 pounds at     admission.  PLAN:  The patient will be discharged home on low-dose Lasix.  Care needs to be taken to not volume deplete this patient with right-sided heart failure.  Severe pulmonary hypertension.  The patient is followed at [facility] with pulmonary vascular disease center by Dr. Marlane Mingle.  She will continue on the present medical regimen that she is on and follow up with Dr. Monia Pouch as  instructed.  From previous notes it appears the patient is not a good candidate for a long preparation.  DISCHARGE EXAMINATION:  VITAL SIGNS:  Temperature was 98.1, blood pressure 133/49, pulse 79, respirations 20, oxygen saturations 99% on 3 L. GENERAL APPEARANCE:  This is an overweight Caucasian woman in no acute distress.  HEENT:  Color is good with pink lips and normal conjunctiva. HEENT exam was otherwise unremarkable. NECK:  Supple. CHEST:  The patient has significantly increased AP diameter as well as a mild kyphosis.  PULMONARY:  The patient with no increased work of breathing.  She has got decreased breath sounds but no rales or wheezes are noted.  She has a normal expiatory phase.  CARDIOVASCULAR:  2+ radial pulse.  Her precordium is quiet.  She has a machinery like murmur that is systolic heard best at the left sternal border.  She had no JVD in the sitting position. BREAST:  Deferred. ABDOMEN:  Morbidly obese, soft with no guarding or rebound.  No tenderness.  NEUROLOGIC:  The patient is awake, alert.  She is oriented to person, place, time and context.  FINAL LABORATORY:  From January 08, 2013, 2142 hours white count was 5700, with differential as previously noted.  Hemoglobin was 11.4 g, hematocrit 35.2%, platelet count 251,000.  Chemistries with a sodium of 141, potassium 3.9, chloride of 100, CO2 of 29, BUN of 16, creatinine 0.79, calcium was 93.  Final BNP on the January 08, 2013, at 2142 hours was 1104.  The patient did have a baseline of 55 in July 2011. Final chest x-ray as noted above.  DISPOSITION:  The patient is discharged to home.  She does not require home nursing, does have oxygen for home health.  The patient will continue on all of her home medications.  Ceftin 250 mg b.i.d. will be added to her medication list for the next 6 days for 10 days total treatment of cefuroxime.  FOLLOWUP:  The patient will be seen in the office in 7 days, and she will be  notified of an appointment time.  The patient's condition at the time of discharge dictation is improved. Her prognosis is guarded given her advanced pulmonary hypertension and cardiovascular disease.     Rosalyn Gess Norins, MD     MEN/MEDQ  D:  01/09/2013  T:  01/09/2013  Job:  [id number]  cc:   Aurther Loft A. Monia Pouch, MD

## 2013-01-09 NOTE — Progress Notes (Signed)
Pt d.c to home with husband. Charge RN reviewed medications and instructions with PT.

## 2013-01-09 NOTE — Progress Notes (Signed)
Subjective:  feels better  Objective: Lab: Lab Results  Component Value Date   WBC 5.7 01/08/2013   HGB 11.4* 01/08/2013   HCT 35.2* 01/08/2013   MCV 89.8 01/08/2013   PLT 251 01/08/2013   BMET    Component Value Date/Time   NA 141 01/08/2013 2142   K 3.9 01/08/2013 2142   CL 100 01/08/2013 2142   CO2 29 01/08/2013 2142   GLUCOSE 129* 01/08/2013 2142   BUN 16 01/08/2013 2142   CREATININE 0.79 01/08/2013 2142   CALCIUM 9.3 01/08/2013 2142   GFRNONAA >90 01/08/2013 2142   GFRAA >90 01/08/2013 2142     Imaging:  Scheduled Meds:   . sodium chloride   Intravenous Once  . albuterol  2.5 mg Nebulization BID  . ambrisentan  5 mg Oral Daily  . azithromycin  500 mg Oral Daily  . cefUROXime  250 mg Oral BID WC  . enoxaparin (LOVENOX) injection  40 mg Subcutaneous Q24H  . FLUoxetine  20 mg Oral q morning - 10a  . furosemide  40 mg Intravenous BID  . lisinopril  5 mg Oral Daily  . potassium chloride  10 mEq Oral Daily  . sodium chloride  3 mL Intravenous Q12H  . sodium chloride  3 mL Intravenous Q12H  . Tadalafil (PAH)  20 mg Oral BID   Continuous Infusions:  PRN Meds:.sodium chloride, acetaminophen, acetaminophen, albuterol, ALPRAZolam, guaiFENesin-dextromethorphan, HYDROcodone-acetaminophen, sodium chloride   Physical Exam: Filed Vitals:   01/09/13 0536  BP: 133/49  Pulse: 79  Temp: 98.1 F (36.7 C)  Resp: 20        Assessment/Plan: Ready for d/c  - see d/c summary #[id number] Copy of d/c was to be sent to Dr. Monia Pouch at [facility] F[facility] next Wednesday.   Illene Regulus [facility] (o) [phone]; (c) [phone] Call-grp - [name]  Tele: [phone]  01/09/2013, 6:22 AM

## 2013-01-10 NOTE — ED Provider Notes (Signed)
Medical screening examination/treatment/procedure(s) were performed by non-physician practitioner and as supervising physician I was immediately available for consultation/collaboration.  Gilda Crease, MD 01/10/13 [phone]

## 2013-01-13 LAB — CULTURE, BLOOD (ROUTINE X 2): Culture: NO GROWTH

## 2013-01-15 ENCOUNTER — Ambulatory Visit (INDEPENDENT_AMBULATORY_CARE_PROVIDER_SITE_OTHER): Payer: Medicaid Other | Admitting: Internal Medicine

## 2013-01-15 ENCOUNTER — Encounter: Payer: Self-pay | Admitting: Internal Medicine

## 2013-01-15 VITALS — BP 118/58 | HR 73 | Temp 97.9°F | Resp 10 | Wt 168.0 lb

## 2013-01-15 DIAGNOSIS — K219 Gastro-esophageal reflux disease without esophagitis: Secondary | ICD-10-CM

## 2013-01-15 DIAGNOSIS — F411 Generalized anxiety disorder: Secondary | ICD-10-CM

## 2013-01-15 DIAGNOSIS — J189 Pneumonia, unspecified organism: Secondary | ICD-10-CM

## 2013-01-15 DIAGNOSIS — I2729 Other secondary pulmonary hypertension: Secondary | ICD-10-CM

## 2013-01-15 DIAGNOSIS — I2789 Other specified pulmonary heart diseases: Secondary | ICD-10-CM

## 2013-01-15 DIAGNOSIS — I509 Heart failure, unspecified: Secondary | ICD-10-CM

## 2013-01-15 DIAGNOSIS — F419 Anxiety disorder, unspecified: Secondary | ICD-10-CM | POA: Insufficient documentation

## 2013-01-15 MED ORDER — PANTOPRAZOLE SODIUM 40 MG PO TBEC: 40.0000 mg | DELAYED_RELEASE_TABLET | Freq: Every day | ORAL | Status: DC

## 2013-01-15 MED ORDER — SERTRALINE HCL 100 MG PO TABS: 100.0000 mg | ORAL_TABLET | Freq: Every day | ORAL | Status: DC

## 2013-01-15 NOTE — Assessment & Plan Note (Signed)
Stable and at her baseline.

## 2013-01-15 NOTE — Assessment & Plan Note (Signed)
Chronic anxiety   Plan - sertraline 100 mg 1/2 or 1 tablet daily  Xanax 0.25 every 6 hours as needed.

## 2013-01-15 NOTE — Progress Notes (Signed)
  Subjective:    Patient ID: [name], female    DOB: 1986/11/29, 29 y.o.   MRN: [id number]  HPI [name] was hospitalized Jan 13-16th for CAP. She did very well, responding to nebulizer treatments and antibiotics. Since d/c she has continue to due well: no cough, breathing is at her baseline. She is finishing her ceftin.  Anxiety - chronic anxiety related to respiratory distress. She has trouble with prozac due to the capsules.  She is complaining of chronic heartburn. tums helps but she is having to take tums several times a day.   PMH, FamHx and SocHx reviewed for any changes and relevance. Current Outpatient Prescriptions on File Prior to Visit  Medication Sig Dispense Refill  . ALPRAZolam (XANAX) 0.25 MG tablet Take 0.25-0.5 mg by mouth 2 (two) times daily as needed. For anxiety      . ambrisentan (LETAIRIS) 5 MG tablet Take 5 mg by mouth daily.      . cefUROXime (CEFTIN) 250 MG tablet Take 1 tablet (250 mg total) by mouth 2 (two) times daily with a meal.  12 tablet  0  . FLUoxetine (PROZAC) 20 MG capsule Take 20 mg by mouth every morning.      . furosemide (LASIX) 20 MG tablet 40 mg. Take 40 mg by mouth daily.      Marland Kitchen HYDROcodone-acetaminophen (NORCO) 7.5-325 MG per tablet Take 1 tablet by mouth every 4 (four) hours as needed for pain.  150 tablet  3  . norgestrel-ethinyl estradiol (LO/OVRAL) 0.3-30 MG-MCG tablet Take 1 tablet by mouth daily.  1 Package  11  . potassium chloride (K-DUR) 10 MEQ tablet Take 10 mEq by mouth daily.      . Tadalafil, PAH, 20 MG TABS Take 1 tablet (20 mg total) by mouth 2 (two) times daily.  60 tablet        Review of Systems System review is negative for any constitutional, cardiac, pulmonary, GI or neuro symptoms or complaints other than as described in the HPI.     Objective:   Physical Exam Filed Vitals:   01/15/13 1548  BP: 118/58  Pulse: 73  Temp: 97.9 F (36.6 C)  Resp: 10   Wt Readings from Last 3 Encounters:  01/15/13  168 lb (76.204 kg)  01/09/13 170 lb 13.7 oz (77.5 kg)  08/22/12 181 lb 12 oz (82.441 kg)   Gen';l - WNWD woman in no distress HEENT- C&S clear Cor - RRR Neuro - A&O x 3       Assessment & Plan:

## 2013-01-15 NOTE — Assessment & Plan Note (Signed)
Hospitalized Jan 13-16, 2013 for community acquired pneumonia. Since discharge she has been doing well. No cough, no sputum production or hemoptysis. Her respiratory status has been at her baseline, on 3 liter oxygen by nasal cannula. Once she is finished her ceftin she will be done with treatment.

## 2013-01-15 NOTE — Assessment & Plan Note (Signed)
Having daily heartburn that is moderate to severe. NO dysphagia, no hematemesis.  Plan - pantoprazole 40 mg - specifically due to all mfg being tablets not capsules.

## 2013-01-15 NOTE — Patient Instructions (Addendum)
1. Chronic anxiety   Plan - sertraline 100 mg 1/2 or 1 tablet daily  Xanax 0.25 every 6 hours as needed.   2. Having daily heartburn that is moderate to severe. NO dysphagia, no hematemesis.  Plan - pantoprazole 40 mg - specifically due to all mfg being tablets not capsules.  3. Hospitalized Jan 13-16, 2013 for community acquired pneumonia. Since discharge she has been doing well. No cough, no sputum production or hemoptysis. Her respiratory status has been at her baseline, on 3 liter oxygen by nasal cannula. Once she is finished her ceftin she will be done with treatment.

## 2013-01-31 ENCOUNTER — Other Ambulatory Visit: Payer: Self-pay | Admitting: Internal Medicine

## 2013-01-31 DIAGNOSIS — T887XXA Unspecified adverse effect of drug or medicament, initial encounter: Secondary | ICD-10-CM

## 2013-02-10 ENCOUNTER — Other Ambulatory Visit (INDEPENDENT_AMBULATORY_CARE_PROVIDER_SITE_OTHER): Payer: Medicaid Other

## 2013-02-10 DIAGNOSIS — T887XXA Unspecified adverse effect of drug or medicament, initial encounter: Secondary | ICD-10-CM

## 2013-02-10 LAB — HEPATIC FUNCTION PANEL
Alkaline Phosphatase: 44 U/L (ref 39–117)
Bilirubin, Direct: 0.4 mg/dL — ABNORMAL HIGH (ref 0.0–0.3)
Total Protein: 7.7 g/dL (ref 6.0–8.3)

## 2013-02-10 LAB — CBC WITH DIFFERENTIAL/PLATELET
Basophils Absolute: 0 10*3/uL (ref 0.0–0.1)
Eosinophils Absolute: 0.2 10*3/uL (ref 0.0–0.7)
Lymphocytes Relative: 22.8 % (ref 12.0–46.0)
MCHC: 32.7 g/dL (ref 30.0–36.0)
MCV: 85.7 fl (ref 78.0–100.0)
Monocytes Absolute: 0.6 10*3/uL (ref 0.1–1.0)
Neutrophils Relative %: 66.2 % (ref 43.0–77.0)
Platelets: 228 10*3/uL (ref 150.0–400.0)
RDW: 15.3 % — ABNORMAL HIGH (ref 11.5–14.6)

## 2013-02-13 ENCOUNTER — Telehealth: Payer: Self-pay | Admitting: *Deleted

## 2013-02-13 NOTE — Telephone Encounter (Signed)
Labs faxed to [facility] at [phone], Dr Marlane Mingle and to [facility], Lab [facility] [phone].

## 2013-02-20 ENCOUNTER — Other Ambulatory Visit: Payer: Self-pay | Admitting: *Deleted

## 2013-02-20 MED ORDER — HYDROCODONE-ACETAMINOPHEN 7.5-325 MG PO TABS: 1.0000 | ORAL_TABLET | ORAL | Status: DC | PRN

## 2013-02-20 NOTE — Telephone Encounter (Signed)
PATIENT CALLED REQUEST REFILL ON HYDROCODONE. CB #[phone]

## 2013-02-26 ENCOUNTER — Telehealth: Payer: Self-pay | Admitting: *Deleted

## 2013-02-26 NOTE — Telephone Encounter (Signed)
Received message on pharmacy/physician line from [facility].  States patient was seen there in 07/2012 and they are requesting our NPI.  Returned call to advise that Ms. Sowder is not a patient here at [facility] but is seen by Dr. Debby Bud at [facility].  Advised I could not authorize visits since we have never seen this patient and  that she needs to have [place] removed from her Medicaid Card as PCP.  Ileana Ladd

## 2013-03-07 ENCOUNTER — Telehealth: Payer: Self-pay

## 2013-03-07 NOTE — Telephone Encounter (Signed)
Phone call from [place] with wound care. Pt's office visit note from 01/15/13 was faxed to [phone]. Also gave her an additional phone number for the pt and what pt's oxygen level was on 01/15/13.

## 2013-03-10 ENCOUNTER — Other Ambulatory Visit: Payer: Self-pay | Admitting: Internal Medicine

## 2013-03-17 ENCOUNTER — Telehealth: Payer: Self-pay | Admitting: Internal Medicine

## 2013-03-17 NOTE — Telephone Encounter (Signed)
Patients boyfriend is calling because per [facility] the patients xanax was denied and they would like to know why and how they can get her refill

## 2013-03-18 NOTE — Telephone Encounter (Signed)
Rx for #60 sent to [facility] March 17th! May confirm Rx received and also provide 5 refills.

## 2013-03-19 ENCOUNTER — Other Ambulatory Visit: Payer: Self-pay | Admitting: Internal Medicine

## 2013-03-19 NOTE — Telephone Encounter (Signed)
I called [facility] and they do not have record of a script for xanax from 03/10/13. So I spoke to the pharmacist and let him know per Dr Debby Bud okay to fill #60 with 5 additional refills.

## 2013-03-19 NOTE — Telephone Encounter (Signed)
Already called in per Dr Debby Bud note

## 2013-05-20 ENCOUNTER — Other Ambulatory Visit: Payer: Self-pay

## 2013-05-21 ENCOUNTER — Other Ambulatory Visit: Payer: Self-pay | Admitting: Internal Medicine

## 2013-05-21 MED ORDER — HYDROCODONE-ACETAMINOPHEN 7.5-325 MG PO TABS: 1.0000 | ORAL_TABLET | ORAL | Status: DC | PRN

## 2013-05-21 MED ORDER — POTASSIUM CHLORIDE ER 10 MEQ PO TBCR: 10.0000 meq | EXTENDED_RELEASE_TABLET | Freq: Every day | ORAL | Status: DC

## 2013-05-21 NOTE — Addendum Note (Signed)
Addended by: Noreene Larsson on: 05/21/2013 09:43 AM   Modules accepted: Orders

## 2013-05-21 NOTE — Telephone Encounter (Signed)
Hydrocodone called to [facility] on [address]. Was advised potassium needs refilled at [facility] as well. This was done.

## 2013-06-04 ENCOUNTER — Other Ambulatory Visit (INDEPENDENT_AMBULATORY_CARE_PROVIDER_SITE_OTHER): Payer: Medicaid Other

## 2013-06-04 DIAGNOSIS — T887XXA Unspecified adverse effect of drug or medicament, initial encounter: Secondary | ICD-10-CM

## 2013-06-04 LAB — HEPATIC FUNCTION PANEL
ALT: 39 U/L — ABNORMAL HIGH (ref 0–35)
Albumin: 3.8 g/dL (ref 3.5–5.2)
Alkaline Phosphatase: 58 U/L (ref 39–117)
Bilirubin, Direct: 0.2 mg/dL (ref 0.0–0.3)
Total Protein: 7.3 g/dL (ref 6.0–8.3)

## 2013-06-04 LAB — CBC WITH DIFFERENTIAL/PLATELET
Basophils Relative: 0.8 % (ref 0.0–3.0)
Eosinophils Absolute: 0.1 10*3/uL (ref 0.0–0.7)
Eosinophils Relative: 2 % (ref 0.0–5.0)
Hemoglobin: 11 g/dL — ABNORMAL LOW (ref 12.0–15.0)
Lymphocytes Relative: 17.1 % (ref 12.0–46.0)
MCHC: 33.4 g/dL (ref 30.0–36.0)
MCV: 86 fl (ref 78.0–100.0)
Neutro Abs: 5.1 10*3/uL (ref 1.4–7.7)
Neutrophils Relative %: 74.5 % (ref 43.0–77.0)
RBC: 3.85 Mil/uL — ABNORMAL LOW (ref 3.87–5.11)
WBC: 6.8 10*3/uL (ref 4.5–10.5)

## 2013-07-14 ENCOUNTER — Other Ambulatory Visit: Payer: Self-pay | Admitting: Internal Medicine

## 2013-07-21 ENCOUNTER — Other Ambulatory Visit: Payer: Self-pay | Admitting: Internal Medicine

## 2013-08-13 ENCOUNTER — Other Ambulatory Visit: Payer: Self-pay | Admitting: Internal Medicine

## 2013-08-13 NOTE — Telephone Encounter (Signed)
Hydrocodone called to pharmacy  

## 2013-08-26 ENCOUNTER — Other Ambulatory Visit: Payer: Self-pay | Admitting: Internal Medicine

## 2013-08-27 NOTE — Telephone Encounter (Signed)
Alprazolam called to pharmacy  

## 2013-10-13 ENCOUNTER — Telehealth: Payer: Self-pay | Admitting: Internal Medicine

## 2013-10-13 MED ORDER — HYDROCODONE-ACETAMINOPHEN 7.5-325 MG PO TABS: 1.0000 | ORAL_TABLET | ORAL | Status: DC | PRN

## 2013-10-13 NOTE — Telephone Encounter (Signed)
[name] notified and was transferred to scheduling desk for an appt; rx printed and at front desk

## 2013-10-13 NOTE — Telephone Encounter (Signed)
10.20.2014  Pt is requesting a refill on RX:: HYDROcodone-acetaminophen (NORCO) 7.5-325 MG per tablet [[id number]]

## 2013-10-13 NOTE — Telephone Encounter (Signed)
Ok. Needs ov before any additional refills

## 2013-10-15 NOTE — Telephone Encounter (Signed)
A user error has taken place.

## 2013-10-30 ENCOUNTER — Other Ambulatory Visit: Payer: Self-pay

## 2013-11-05 ENCOUNTER — Encounter: Payer: Self-pay | Admitting: Internal Medicine

## 2013-11-05 ENCOUNTER — Ambulatory Visit (INDEPENDENT_AMBULATORY_CARE_PROVIDER_SITE_OTHER): Payer: Medicaid Other | Admitting: Internal Medicine

## 2013-11-05 VITALS — BP 128/70 | HR 77 | Temp 99.2°F | Wt 172.0 lb

## 2013-11-05 DIAGNOSIS — I2789 Other specified pulmonary heart diseases: Secondary | ICD-10-CM

## 2013-11-05 DIAGNOSIS — K219 Gastro-esophageal reflux disease without esophagitis: Secondary | ICD-10-CM

## 2013-11-05 DIAGNOSIS — I2729 Other secondary pulmonary hypertension: Secondary | ICD-10-CM

## 2013-11-05 DIAGNOSIS — G894 Chronic pain syndrome: Secondary | ICD-10-CM

## 2013-11-05 DIAGNOSIS — I509 Heart failure, unspecified: Secondary | ICD-10-CM

## 2013-11-05 DIAGNOSIS — Z23 Encounter for immunization: Secondary | ICD-10-CM

## 2013-11-05 MED ORDER — HYDROCODONE-ACETAMINOPHEN 7.5-325 MG PO TABS: 1.0000 | ORAL_TABLET | ORAL | Status: DC | PRN

## 2013-11-05 MED ORDER — HYDROCODONE-ACETAMINOPHEN 7.5-325 MG PO TABS
1.0000 | ORAL_TABLET | ORAL | Status: DC | PRN
Start: 2013-11-05 — End: 2014-02-03

## 2013-11-05 NOTE — Progress Notes (Signed)
Subjective:    Patient ID: [name], female    DOB: 23-Mar-1986, 27 y.o.   MRN: [id number]  HPI [name] presents for medical follow up and refill on controlled meds. In the interval since her last visit she continue to follow at [facility] with last visit in May '14. She is continuing to take Radalafil and a new product Opsumit (endothelin 1 receptor antagonist) for reducing pulmonary pressures. She is tolerating these well. She is working on weight reduction and adherence to meds as part of getting eligible for transplant.   She has not any major illness in the interval since her last visit. She continues to deal with chronic pain but has no GI problems, no somnolence or mental status changes.  Past Medical History  Diagnosis Date  . Back pain   . GERD (gastroesophageal reflux disease)   . Asthma     As a child  . Scoliosis deformity of spine   . Anxiety   . Tetralogy of Fallot   . Pulmonary hypertension   . Heart failure    Past Surgical History  Procedure Laterality Date  . Tetralogy of fallot repair      Age 13 at [facility]  . Blalock procedure      3 months  . Back surgery      Rods for scoliosis   Family History  Problem Relation Age of Onset  . Diabetes Mother   . Hypertension Mother   . Cancer Mother     uterine cancer  . Alcohol abuse Father   . Cancer Maternal Aunt     Breast Cancer, Lung cancer   History   Social History  . Marital Status: Single    Spouse Name: N/A    Number of Children: 0  . Years of Education: N/A   Occupational History  . Not on file.   Social History Main Topics  . Smoking status: Former Smoker -- 1.50 packs/day for 10 years    Types: Cigarettes    Quit date: 02/02/2011  . Smokeless tobacco: Never Used  . Alcohol Use: No  . Drug Use: No  . Sexual Activity: Not on file   Other Topics Concern  . Not on file   Social History Narrative   Unemployed at present.  Worked as Conservation officer, nature at [employer].   HSG, [employer]- a  little bit   Single, in a 4 year relationship   No children    Unemployed   No history of abuse    Current Outpatient Prescriptions on File Prior to Visit  Medication Sig Dispense Refill  . ALPRAZolam (XANAX) 0.25 MG tablet TAKE 1 TO 2 TABLETS EVERY DAY AS NEEDED  60 tablet  4  . furosemide (LASIX) 20 MG tablet 40 mg. Take 40 mg by mouth daily.      Marland Kitchen HYDROcodone-acetaminophen (NORCO) 7.5-325 MG per tablet Take 1 tablet by mouth every 4 (four) hours as needed for pain.  150 tablet  0  . norgestrel-ethinyl estradiol (LO/OVRAL,CRYSELLE) 0.3-30 MG-MCG tablet [name]  28 tablet  3  . pantoprazole (PROTONIX) 40 MG tablet Take 1 tablet (40 mg total) by mouth daily.  30 tablet  11  . potassium chloride (K-DUR) 10 MEQ tablet Take 1 tablet (10 mEq total) by mouth daily.  30 tablet  5  . sertraline (ZOLOFT) 100 MG tablet Take 1 tablet (100 mg total) by mouth daily.  30 tablet  11  . Tadalafil, PAH, 20 MG TABS Take 1 tablet (20  mg total) by mouth 2 (two) times daily.  60 tablet     No current facility-administered medications on file prior to visit.      Review of Systems System review is negative for any constitutional, cardiac, pulmonary, GI or neuro symptoms or complaints other than as described in the HPI.     Objective:   Physical Exam Filed Vitals:   11/05/13 1035  BP: 128/70  Pulse: 77  Temp: 99.2 F (37.3 C)   BP Readings from Last 3 Encounters:  11/05/13 128/70  01/15/13 118/58  01/09/13 133/49   Wt Readings from Last 3 Encounters:  11/05/13 172 lb (78.019 kg)  01/15/13 168 lb (76.204 kg)  01/09/13 170 lb 13.7 oz (77.5 kg)   Gen'l- mildly overweight woman in no distress HEENT- C&S clear Cor - 2+ radial, RRR, III/VI systolic rumbling murmur best at RSB, II/VI LSB Pulm - normal respirations, no increased WOB, no rales, no wheezing.       Assessment & Plan:

## 2013-11-05 NOTE — Patient Instructions (Signed)
Good to see you. You are looks good.  PUlmonary - you seem stable and tolerating the medical regimen well.Marland Kitchen Keep working on that elgibility criteria.  Pain management: will execute a controlled substance contract today. Will issue refills for the hydrocodone with a 31 days supply with 3 scripts.  Immunizations - will give a flu shot today. Please return Jan 8th or after for Prevnar conjugated pneumonia vaccine.

## 2013-11-05 NOTE — Progress Notes (Signed)
Pre visit review using our clinic review tool, if applicable. No additional management support is needed unless otherwise documented below in the visit note. 

## 2013-11-06 DIAGNOSIS — F1120 Opioid dependence, uncomplicated: Secondary | ICD-10-CM | POA: Insufficient documentation

## 2013-11-06 NOTE — Assessment & Plan Note (Signed)
Symptoms are well controlled with full dose Nexium.

## 2013-11-06 NOTE — Assessment & Plan Note (Signed)
Followed at [facility]. She is medically managed and has been stable re: heart failure. To be eligible for transplant list she needs to loose weight to 120 lbs goal.  Plan Continue present care at [facility] and current medications.  Regular labs being done here and made available to her care team at [facility] via Care Everywhere.

## 2013-11-06 NOTE — Assessment & Plan Note (Signed)
Stable in regard to pain control on present high dose hydrocodone. Rx x 3 provided for 31 day supply.

## 2013-11-10 ENCOUNTER — Other Ambulatory Visit: Payer: Self-pay | Admitting: Internal Medicine

## 2013-11-24 IMAGING — CR DG CHEST 2V
2 series · 2 of 2 positions shown · non-contrast
Comparison: 12/28/2012

CLINICAL DATA: Chest pain

CHEST - 2 VIEW

[w chest pa]
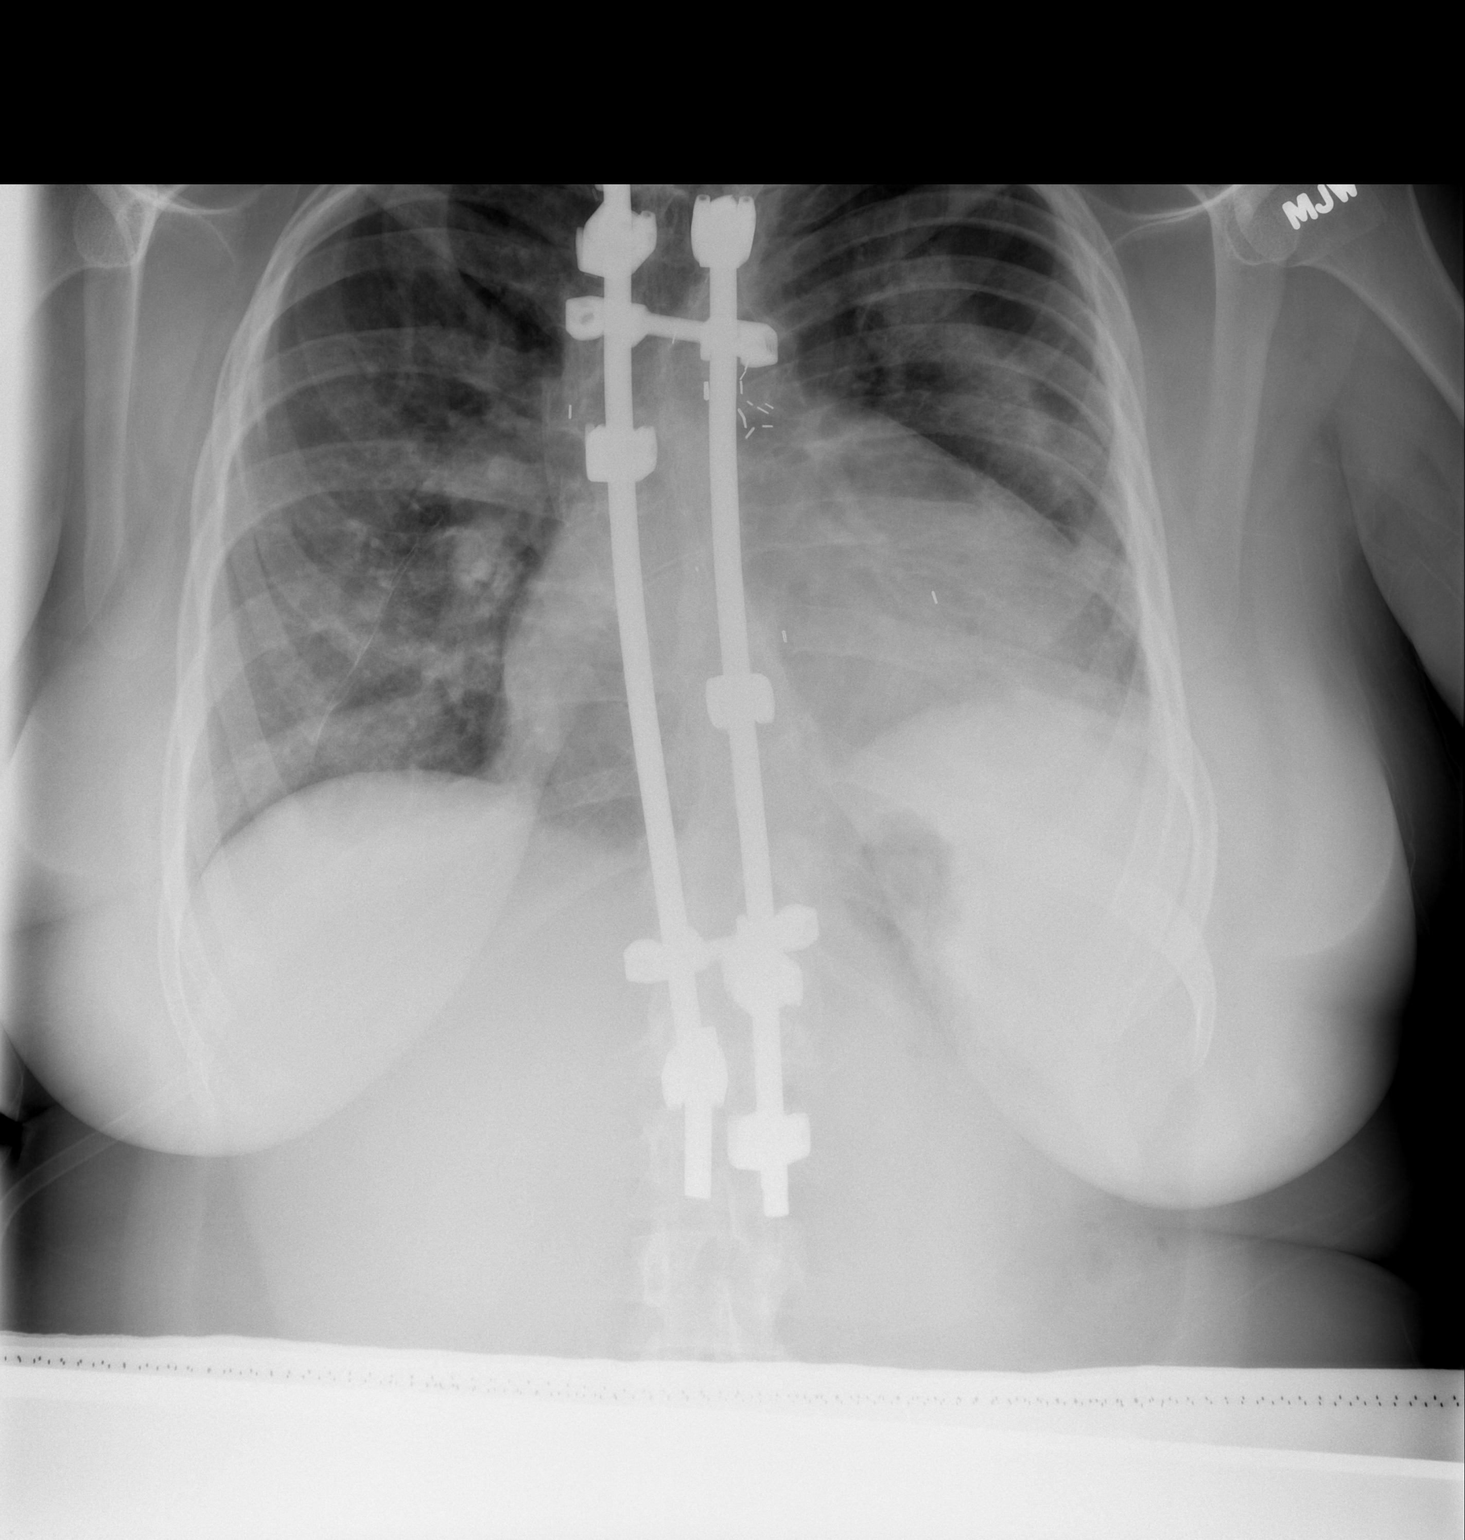

[w chest lat]
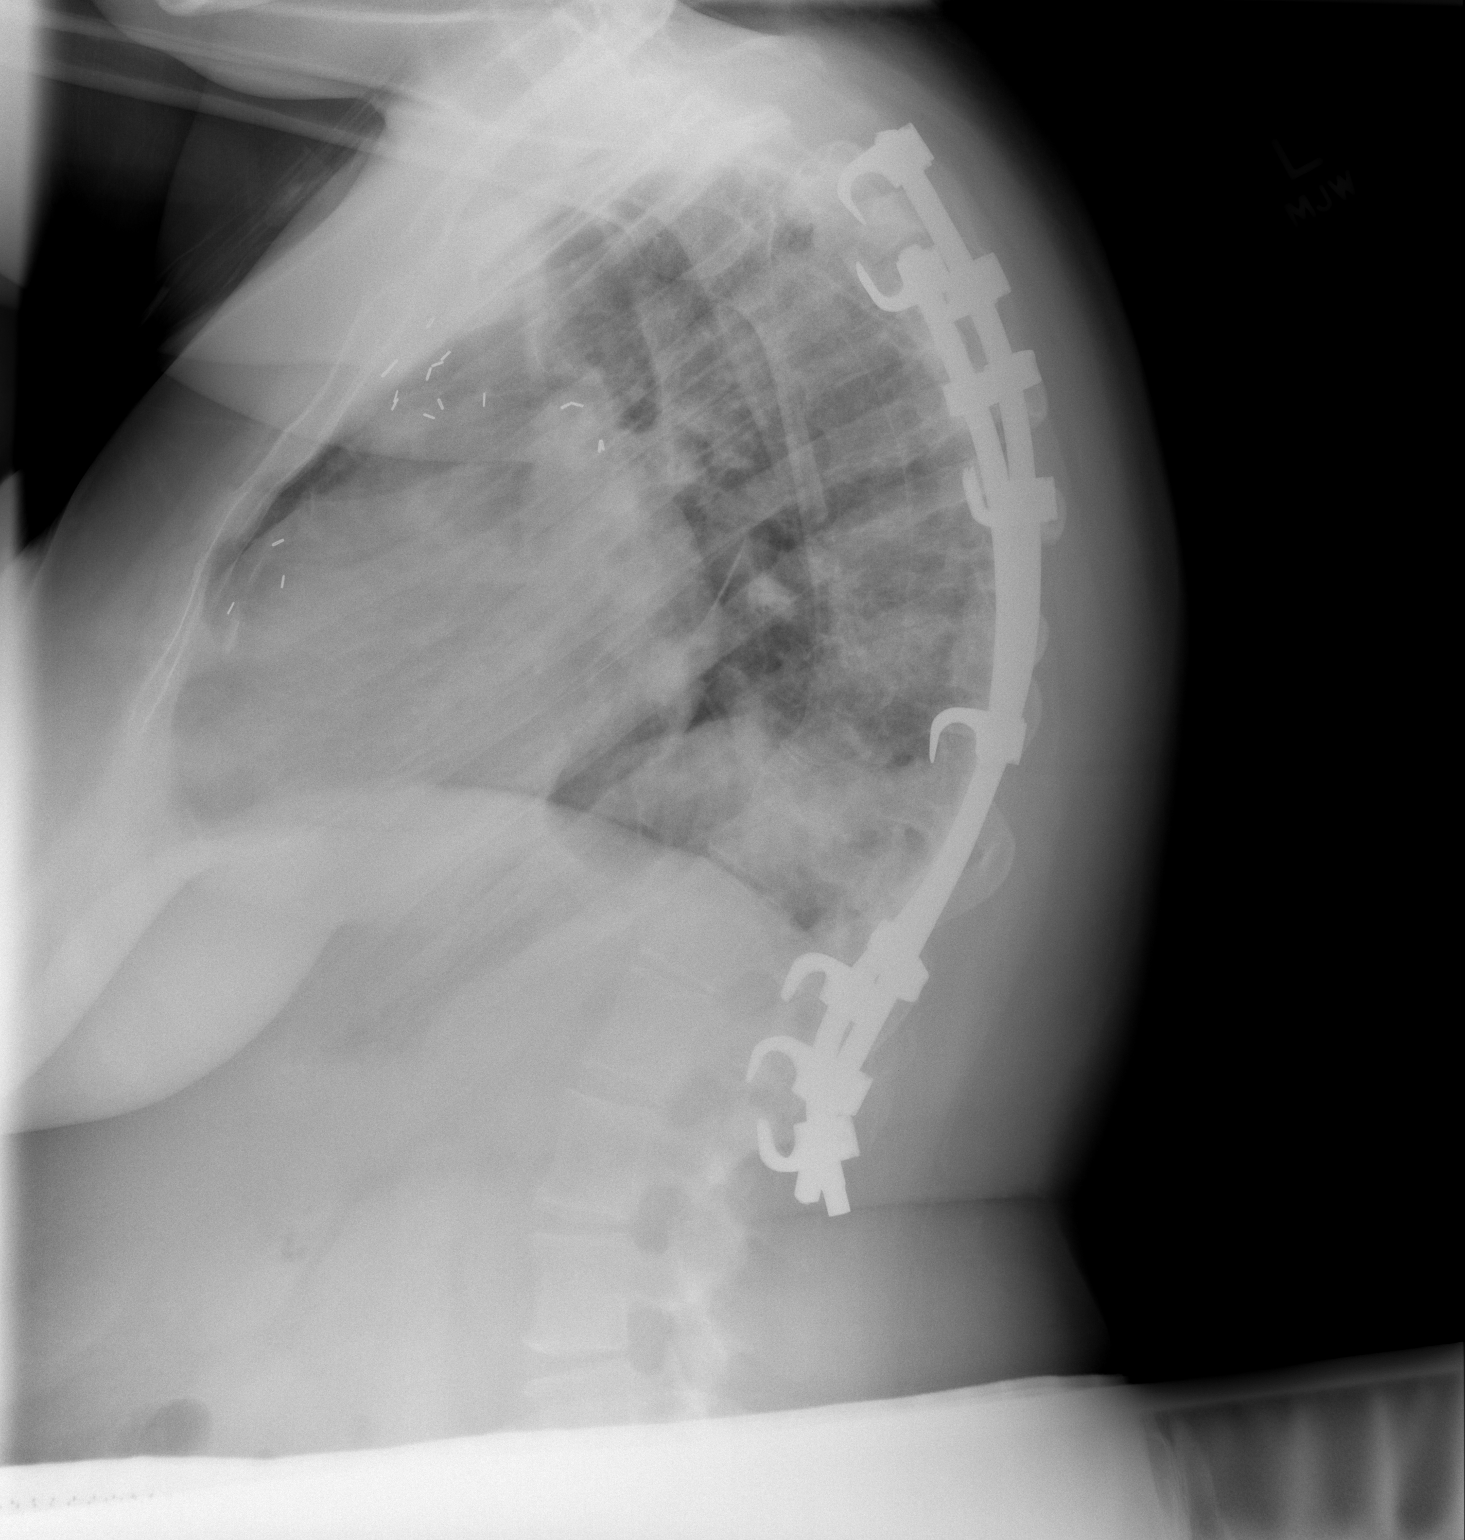

[2 of 2 positions shown; findings below may reference images not displayed]

FINDINGS: Thoracolumbar stabilization hardware is in place and is
stable.  The heart remains moderately enlarged.  Bilateral central
and basilar airspace opacities are worse.  No pneumothorax.   Small
left pleural effusion has developed.  Linear density obliquely
oriented in the right lower lung zone is of unknown significance
and is stable.
IMPRESSION: Worsening bilateral airspace disease.  Left pleural effusion.

## 2013-11-26 IMAGING — CR DG CHEST 2V
2 series · 2 of 2 positions shown · non-contrast
Comparison: Chest x-ray dated 01/06/2013 and chest CT dated
12/28/2012 as well as chest x-rays dated 04/13/2012 and 11/04/2011

CLINICAL DATA: Pneumonia. Repaired tetralogy of Noussa.

CHEST - 2 VIEW

[w chest pa]
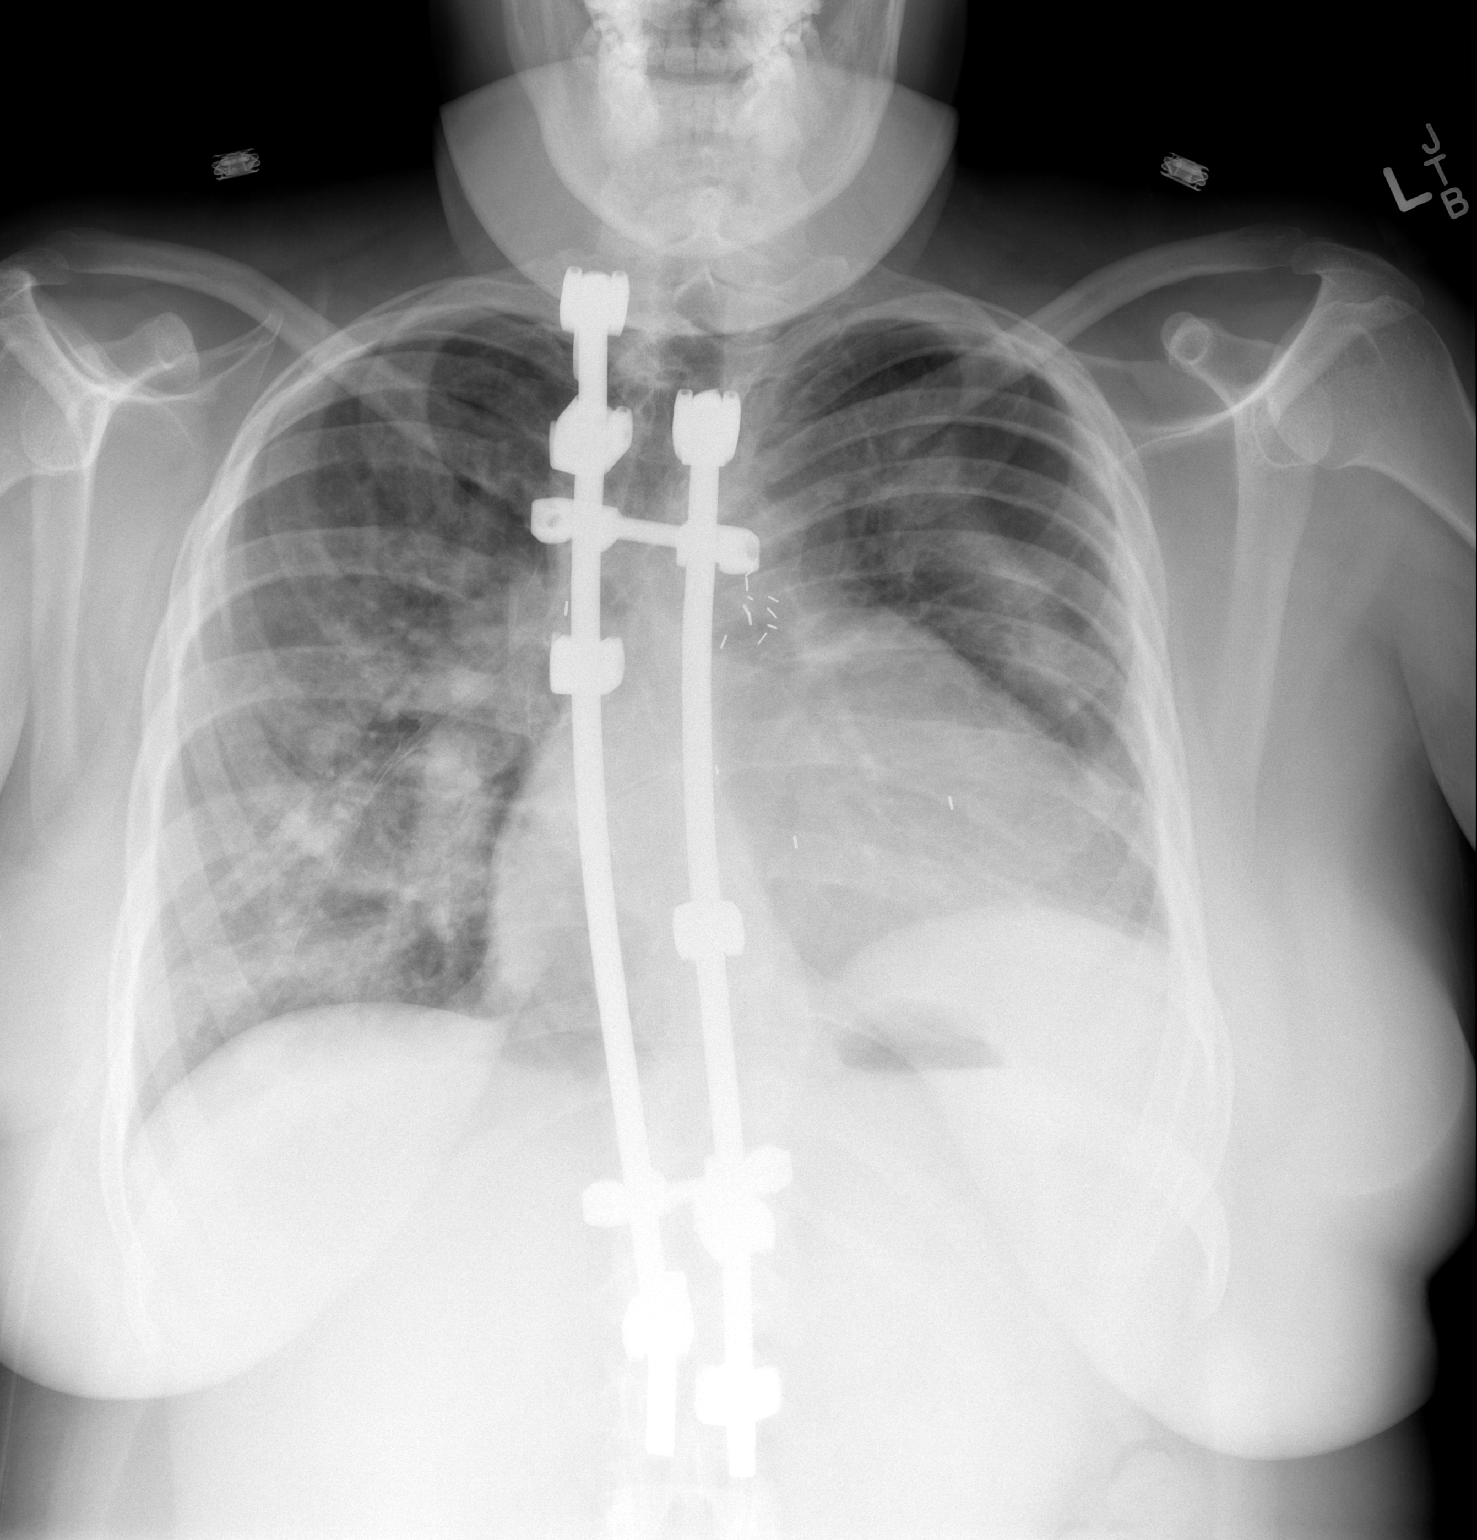

[w chest lat]
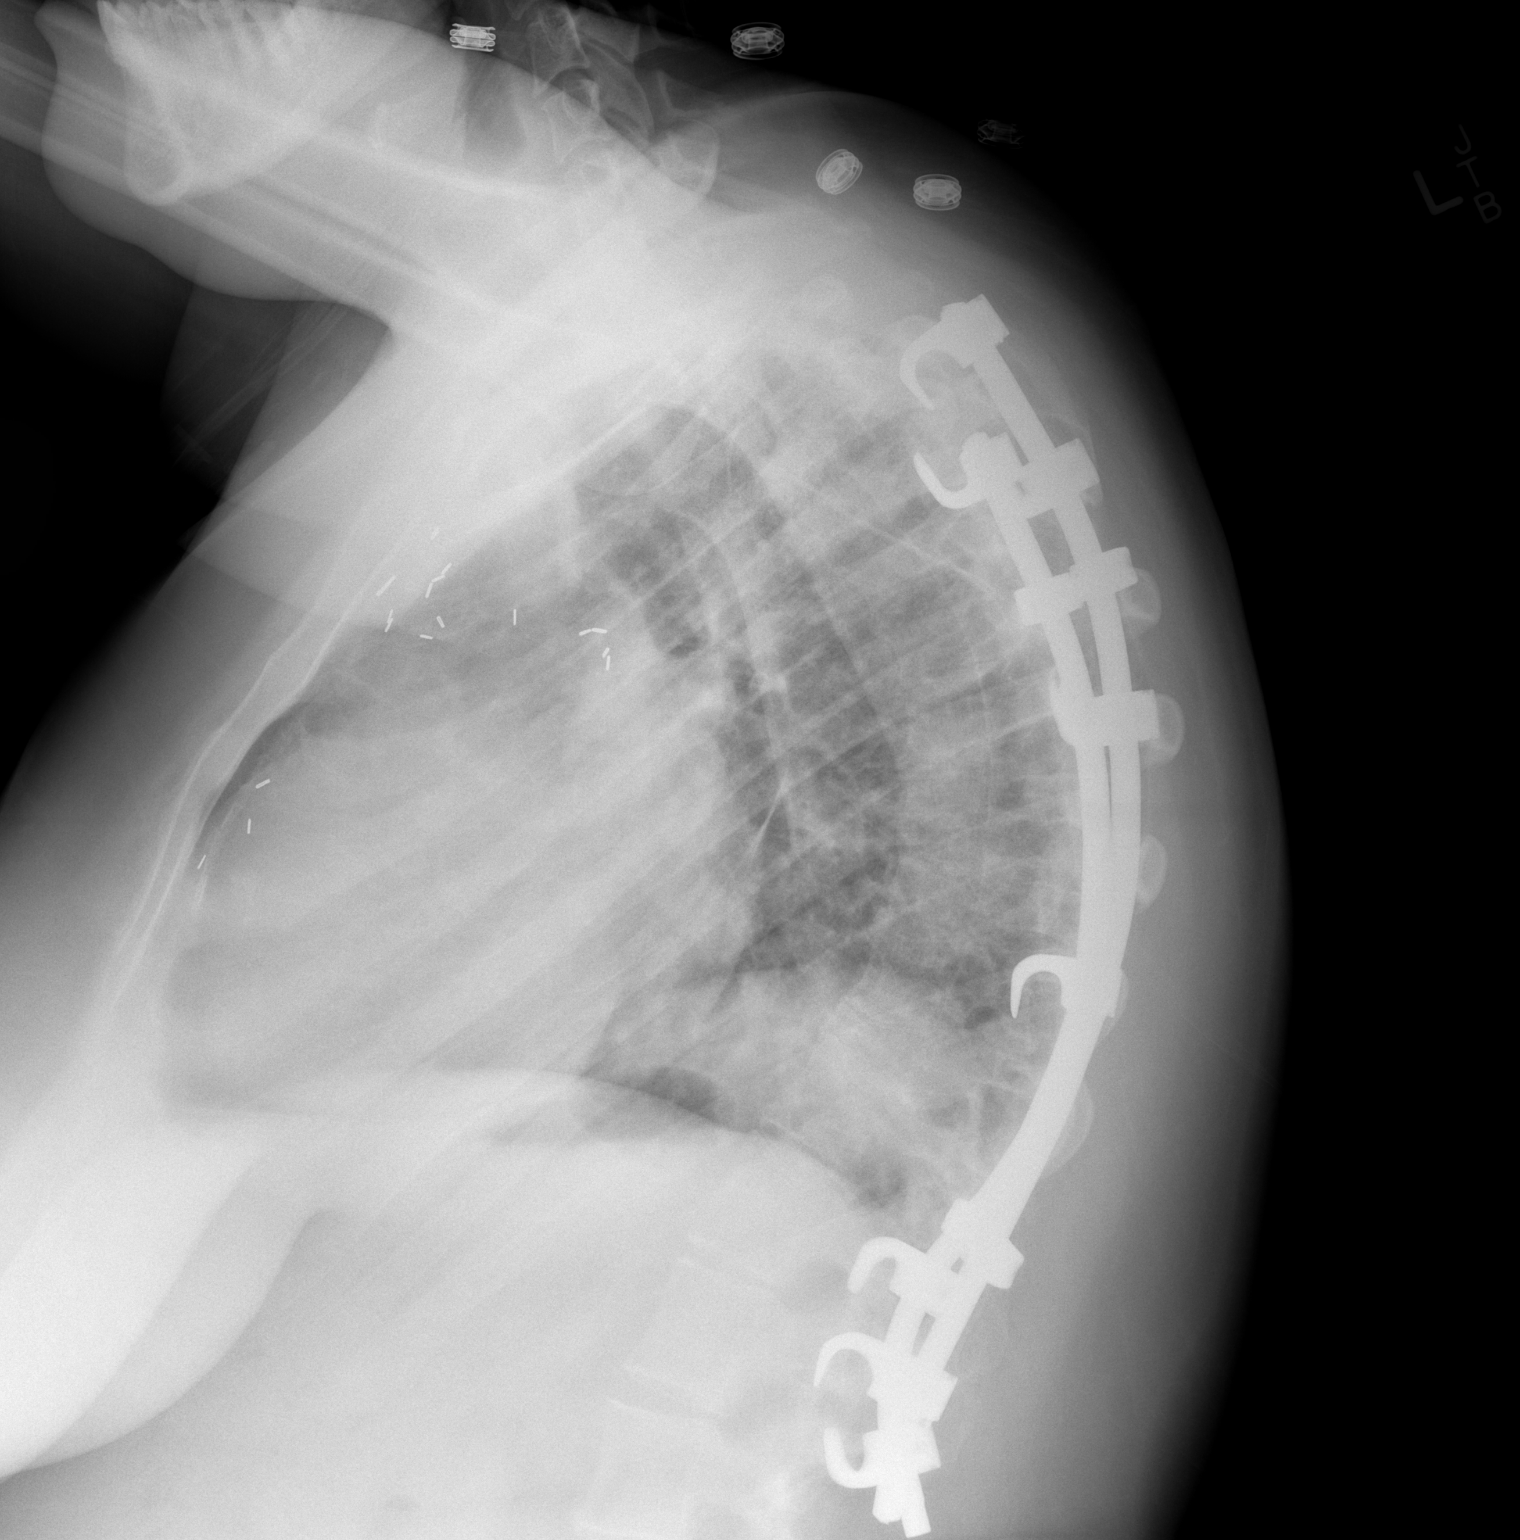

[2 of 2 positions shown; findings below may reference images not displayed]

FINDINGS: The faint infiltrate in the left upper lung zone is
unchanged.  Overall density in the right hemithorax has slightly
increased even considering slightly different technique.  There is
chronic cardiomegaly.  Pulmonary vascularity is normal.  No
effusions.
IMPRESSION: 1.  Slight progression of hazy infiltrates in the right lung.
Stable small infiltrate in the left lung.

## 2013-11-27 ENCOUNTER — Telehealth: Payer: Self-pay | Admitting: Internal Medicine

## 2013-11-27 NOTE — Telephone Encounter (Signed)
Patient Information:  Caller Name: [name]  Phone: [phone]  Patient: [name], [name]  Gender: Female  DOB: 12/10/1986  Age: 27 Years  PCP: Illene Regulus (Adults only)  Pregnant: No  Office Follow Up:  Does the office need to follow up with this patient?: Yes  Instructions For The Office: Advised pt needs to be seen today. No appts with Dr. Debby Bud or in office and advised to let Triage RN schedule appt in another [place] location for today but pt refused. States she wants appt with Dr. Debby Bud on Monday 12/8 only. Made appt for her but strongly advised she be seen today, perhaps at [facility] if unwilling to go to another [place] office. Unsure if she will follow this advice.  Please make MD aware.  RN Note:  Will make appt.  Symptoms  Reason For Call & Symptoms: Hx of Pulm HTN, enlarged heart, mult health issues, on O2 24 hrs a day.  Mildly producitve cough with significant chest congestion. Mild intermittent wheeze per caller. No fever.  Reviewed Health History In EMR: Yes  Reviewed Medications In EMR: Yes  Reviewed Allergies In EMR: Yes  Reviewed Surgeries / Procedures: Yes  Date of Onset of Symptoms: 11/24/2013  Treatments Tried: Vicks vapo rub  Treatments Tried Worked: Yes OB / GYN:  LMP: 11/13/2013  Guideline(s) Used:  Cough  Colds  Disposition Per Guideline:   Go to Office Now  Reason For Disposition Reached:   Wheezing is present  Advice Given:  Call Back If:  Difficulty breathing  You become worse.  Patient Will Follow Care Advice:  NO  Appointment Scheduled:  12/01/2013 09:30:00 Appointment Scheduled Provider:  Illene Regulus (Adults only)

## 2013-11-27 NOTE — Telephone Encounter (Signed)
Please call patient. Per Dr Debby Bud add on at 9 am tomorrow(Friday) thank you.

## 2013-11-27 NOTE — Telephone Encounter (Signed)
Add on for tomorrow at 0900

## 2013-11-28 ENCOUNTER — Encounter: Payer: Self-pay | Admitting: Internal Medicine

## 2013-11-28 ENCOUNTER — Ambulatory Visit (INDEPENDENT_AMBULATORY_CARE_PROVIDER_SITE_OTHER): Payer: Medicaid Other | Admitting: Internal Medicine

## 2013-11-28 VITALS — BP 130/82 | HR 54 | Temp 98.7°F | Wt 175.8 lb

## 2013-11-28 DIAGNOSIS — J069 Acute upper respiratory infection, unspecified: Secondary | ICD-10-CM

## 2013-11-28 MED ORDER — ALBUTEROL SULFATE HFA 108 (90 BASE) MCG/ACT IN AERS: 2.0000 | INHALATION_SPRAY | Freq: Four times a day (QID) | RESPIRATORY_TRACT | Status: DC | PRN

## 2013-11-28 MED ORDER — CEFUROXIME AXETIL 500 MG PO TABS: 500.0000 mg | ORAL_TABLET | Freq: Two times a day (BID) | ORAL | Status: DC

## 2013-11-28 MED ORDER — AEROCHAMBER PLUS FLO-VU MEDIUM MISC: 1.0000 | Freq: Once | Status: DC

## 2013-11-28 NOTE — Progress Notes (Signed)
Pre visit review using our clinic review tool, if applicable. No additional management support is needed unless otherwise documented below in the visit note. 

## 2013-11-28 NOTE — Progress Notes (Signed)
   Subjective:    Patient ID: [name], female    DOB: [birth year], 27 y.o.   MRN: [id number]  HPI Devora presents with a 4 day h/o coughing, chest congestion and late yesterday and today with mild SOB and a little wheezing. She denies no fever, no nausea or vomiting.   PMH, FamHx and SocHx reviewed for any changes and relevance.  Current Outpatient Prescriptions on File Prior to Visit  Medication Sig Dispense Refill  . ALPRAZolam (XANAX) 0.25 MG tablet TAKE 1 TO 2 TABLETS EVERY DAY AS NEEDED  60 tablet  4  . furosemide (LASIX) 20 MG tablet 40 mg. Take 40 mg by mouth daily.      Marland Kitchen HYDROcodone-acetaminophen (NORCO) 7.5-325 MG per tablet Take 1 tablet by mouth every 4 (four) hours as needed. FILL ON OR AFTER 11/05/2013  196 tablet  0  . Macitentan (OPSUMIT PO) Take 5 mg by mouth daily.      . norgestrel-ethinyl estradiol (LO/OVRAL,CRYSELLE) 0.3-30 MG-MCG tablet TAKE 1 TABLET BY MOUTH EVERY DAY  28 tablet  3  . pantoprazole (PROTONIX) 40 MG tablet Take 1 tablet (40 mg total) by mouth daily.  30 tablet  11  . potassium chloride (K-DUR) 10 MEQ tablet TAKE 1 TABLET (10 MEQ TOTAL) BY MOUTH DAILY.   30 tablet  5  . sertraline (ZOLOFT) 100 MG tablet Take 1 tablet (100 mg total) by mouth daily.  30 tablet  11  . Tadalafil, PAH, 20 MG TABS Take 1 tablet (20 mg total) by mouth 2 (two) times daily.  60 tablet     No current facility-administered medications on file prior to visit.      Review of Systems System review is negative for any constitutional, cardiac, pulmonary, GI or neuro symptoms or complaints other than as described in the HPI.     Objective:   Physical Exam Filed Vitals:   11/28/13 0910  BP: 130/82  Pulse: 54  Temp: 98.7 F (37.1 C)   O2 sat 96% on 3 l O2 (she is chronically on oxygen)  HEENT- no sinus tenderness ot percussion Cor -  radial pulse with RRR Pulm - no increased WOB, no prolonged expiratory phase, no wheezing.        Assessment & Plan:    Respiratory infection -  highly compromised patient presenting early in the course of a respiratory infection. Aggressive care is appropriate given her underlying illness.   Plan Ceftin 500 mg bid x 7 days  ProAir inhaler with aerochamber 2 puffs 3 times a day until better  Mucinex 1200 mg twice a day  Hydrate  Tylenol 500 mg 2 tabs 3 times a day until better

## 2013-11-28 NOTE — Patient Instructions (Signed)
Respiratory infection -  highly compromised patient presenting early in the course of a respiratory infection. Aggressive care is appropriate given her underlying illness.   Plan Ceftin 500 mg bid x 7 days  ProAir inhaler with aerochamber 2 puffs 3 times a day until better  Mucinex 1200 mg twice a day  Hydrate  Tylenol 500 mg 2 tabs 3 times a day until better

## 2013-12-01 ENCOUNTER — Other Ambulatory Visit: Payer: Self-pay | Admitting: Internal Medicine

## 2013-12-01 ENCOUNTER — Ambulatory Visit: Payer: Self-pay | Admitting: Internal Medicine

## 2013-12-10 ENCOUNTER — Other Ambulatory Visit: Payer: Self-pay | Admitting: Internal Medicine

## 2014-01-06 ENCOUNTER — Other Ambulatory Visit: Payer: Self-pay | Admitting: Internal Medicine

## 2014-01-08 ENCOUNTER — Other Ambulatory Visit: Payer: Self-pay | Admitting: Internal Medicine

## 2014-01-23 ENCOUNTER — Other Ambulatory Visit: Payer: Self-pay | Admitting: Internal Medicine

## 2014-01-27 ENCOUNTER — Other Ambulatory Visit: Payer: Self-pay | Admitting: Internal Medicine

## 2014-01-29 ENCOUNTER — Other Ambulatory Visit: Payer: Self-pay | Admitting: Internal Medicine

## 2014-02-02 ENCOUNTER — Telehealth: Payer: Self-pay | Admitting: *Deleted

## 2014-02-02 ENCOUNTER — Other Ambulatory Visit: Payer: Self-pay | Admitting: Internal Medicine

## 2014-02-02 NOTE — Telephone Encounter (Signed)
Patient phoned requesting refills for her pain meds.  Last OV with PCP 11/28/13    Norco 196 tabs to be dispensed 11/05/13  Please advise  CB# [phone]

## 2014-02-02 NOTE — Telephone Encounter (Signed)
Ok for refills of her norco - ov in the next 3 weeks.

## 2014-02-03 MED ORDER — HYDROCODONE-ACETAMINOPHEN 7.5-325 MG PO TABS: 1.0000 | ORAL_TABLET | ORAL | Status: DC | PRN

## 2014-02-03 NOTE — Telephone Encounter (Signed)
Script printed and awaiting MD sig.  Patient notified via voicemail message that script would be available for p/u by lunch today.

## 2014-02-10 ENCOUNTER — Ambulatory Visit: Payer: Medicaid Other | Admitting: Internal Medicine

## 2014-02-16 ENCOUNTER — Encounter: Payer: Self-pay | Admitting: Internal Medicine

## 2014-02-16 ENCOUNTER — Ambulatory Visit (INDEPENDENT_AMBULATORY_CARE_PROVIDER_SITE_OTHER): Payer: Medicaid Other | Admitting: Internal Medicine

## 2014-02-16 VITALS — BP 120/50 | HR 77 | Temp 97.3°F | Wt 172.0 lb

## 2014-02-16 DIAGNOSIS — J189 Pneumonia, unspecified organism: Secondary | ICD-10-CM

## 2014-02-16 DIAGNOSIS — I2789 Other specified pulmonary heart diseases: Secondary | ICD-10-CM

## 2014-02-16 DIAGNOSIS — G894 Chronic pain syndrome: Secondary | ICD-10-CM

## 2014-02-16 DIAGNOSIS — F411 Generalized anxiety disorder: Secondary | ICD-10-CM

## 2014-02-16 DIAGNOSIS — I509 Heart failure, unspecified: Secondary | ICD-10-CM

## 2014-02-16 DIAGNOSIS — Z23 Encounter for immunization: Secondary | ICD-10-CM

## 2014-02-16 DIAGNOSIS — K219 Gastro-esophageal reflux disease without esophagitis: Secondary | ICD-10-CM

## 2014-02-16 DIAGNOSIS — Z0000 Encounter for general adult medical examination without abnormal findings: Secondary | ICD-10-CM

## 2014-02-16 DIAGNOSIS — F419 Anxiety disorder, unspecified: Secondary | ICD-10-CM

## 2014-02-16 DIAGNOSIS — I50810 Right heart failure, unspecified: Secondary | ICD-10-CM

## 2014-02-16 DIAGNOSIS — I2729 Other secondary pulmonary hypertension: Secondary | ICD-10-CM

## 2014-02-16 NOTE — Assessment & Plan Note (Signed)
No cough, sputum production, or hemoptysis. Received prevnar in Jan '14.   Plan Pneumovax vaccine today.

## 2014-02-16 NOTE — Progress Notes (Signed)
Subjective:     Patient ID: [name], female   DOB: 1986/04/30, 28 y.o.   MRN: [id number]  HPI  Patient presents for routine health maintenance and pneumonia vaccine. Reports no major problems at this time.   Past Medical History  Diagnosis Date  . Back pain   . GERD (gastroesophageal reflux disease)   . Asthma     As a child  . Scoliosis deformity of spine   . Anxiety   . Tetralogy of Fallot   . Pulmonary hypertension   . Heart failure    Past Surgical History  Procedure Laterality Date  . Tetralogy of fallot repair      [facility] 75 [facility]  . Blalock procedure      3 months  . Back surgery      Rods for scoliosis   Family History  Problem Relation Age of Onset  . Diabetes Mother   . Hypertension Mother   . Cancer Mother     uterine cancer  . Alcohol abuse Father   . Cancer Maternal Aunt     Breast Cancer, Lung cancer   History   Social History  . Marital Status: Single    Spouse Name: N/A    Number of Children: 0  . Years of Education: N/A   Occupational History  . Not on file.   Social History Main Topics  . Smoking status: Former Smoker -- 1.50 packs/day for 10 years    Types: Cigarettes    Quit date: 02/02/2011  . Smokeless tobacco: Never Used  . Alcohol Use: No  . Drug Use: No  . Sexual Activity: Not on file   Other Topics Concern  . Not on file   Social History Narrative   Unemployed at present.  Worked as Conservation officer, naturecashier at [employer].   [employer], [employer]- a little bit   Single, in a 4 year relationship   No children    Unemployed   No history of abuse     Review of Systems  Constitutional:  Negative for fever, chills, activity change and unexpected weight change.  HEENT:  Positive for congestion. Negative for hearing loss, ear pain, neck stiffness and postnasal drip. Negative for sore throat or swallowing problems.  Eyes: Negative for vision loss or change in visual acuity.  Respiratory: Positive for wheezing, Negative for chest  tightness.  Negative for DOE.   Cardiovascular: Negative for chest pain. Positive for alpitations. No decreased exercise tolerance Gastrointestinal: No change in bowel habit. No bloating or gas. No reflux or indigestion Genitourinary: Negative for urgency, frequency, flank pain and difficulty urinating.  Musculoskeletal: Positive for back pain. Negative for myalgias, arthralgias and gait problem.  Neurological: Negative for dizziness, tremors, weakness and headaches.  Hematological: Negative for adenopathy.  Psychiatric/Behavioral: Negative for behavioral problems and dysphoric mood.       Objective:   Physical Exam  Filed Vitals:   02/16/14 1418  BP: 120/50  Pulse: 77  Temp: 97.3 F (36.3 C)   Wt Readings from Last 3 Encounters:  02/16/14 172 lb (78.019 kg)  11/28/13 175 lb 12.8 oz (79.742 kg)  11/05/13 172 lb (78.019 kg)   Gen'l: well nourished, well developed     Woman in no distress HEENT - [place]/AT, EACs/TMs normal, oropharynx with native dentition in good condition, no buccal or palatal lesions, posterior pharynx clear, mucous membranes moist. C&S clear, PERRLA, fundi - normal Neck - supple, no thyromegaly Nodes- negative submental, cervical, supraclavicular regions Chest - no  deformity, no CVAT Lungs - clear without rales, wheezes. No increased work of breathing Breast - Cardiovascular - Tetralogy of Fallot, quiet precordium, no murmurs, rubs or gallops, 2+ radial, DP and PT pulses Pelvic - Rectal - Extremities - no clubbing, cyanosis, edema or deformity.  Neuro - A&O x 3, CN II-XII normal, motor strength normal and equal, DTRs 2+ and symmetrical biceps, radial, and patellar tendons. Cerebellar - no tremor, no rigidity, fluid movement and normal gait. Derm - Head, neck, back, abdomen and extremities without suspicious lesions  Current Outpatient Prescriptions on File Prior to Visit  Medication Sig Dispense Refill  . albuterol (PROVENTIL HFA;VENTOLIN HFA) 108 (90 BASE)  MCG/ACT inhaler Inhale 2 puffs into the lungs every 6 (six) hours as needed for wheezing or shortness of breath.  1 Inhaler  2  . ALPRAZolam (XANAX) 0.25 MG tablet TAKE 1 OR 2 TABLETS BY MOUTH EVERY DAY AS NEEDED  60 tablet  4  . furosemide (LASIX) 20 MG tablet 40 mg. Take 40 mg by mouth daily.      Marland Kitchen HYDROcodone-acetaminophen (NORCO) 7.5-325 MG per tablet Take 1 tablet by mouth every 4 (four) hours as needed. Patient must schedule appt with PCP within 3 weeks.  196 tablet  0  . Macitentan (OPSUMIT PO) Take 5 mg by mouth daily.      . norgestrel-ethinyl estradiol (LO/OVRAL,CRYSELLE) 0.3-30 MG-MCG tablet TAKE 1 TABLET BY MOUTH EVERY DAY  28 tablet  3  . pantoprazole (PROTONIX) 40 MG tablet TAKE 1 TABLET (40 MG TOTAL) BY MOUTH DAILY.  30 tablet  11  . potassium chloride (K-DUR) 10 MEQ tablet TAKE 1 TABLET (10 MEQ TOTAL) BY MOUTH DAILY.  30 tablet  5  . sertraline (ZOLOFT) 100 MG tablet TAKE 1 TABLET (100 MG TOTAL) BY MOUTH DAILY.  30 tablet  11  . Spacer/Aero-Holding Chambers (AEROCHAMBER PLUS FLO-VU MEDIUM) MISC 1 each by Other route once.  1 each  1   No current facility-administered medications on file prior to visit.       Assessment and Plan:      Patient seen. Agree with assessment and plans per problem list.  M.Norins, MD

## 2014-02-16 NOTE — Assessment & Plan Note (Signed)
Stable on current pantoprazole.  Plan Continue current medication

## 2014-02-16 NOTE — Assessment & Plan Note (Addendum)
Interval history is stable on her current regimen. Limited physical exam w/o change. June '14 lab: LFTs, CBC - normal. Review[facility] May '14 labs [facility].  No additional lab needed. She was last seen May '14 by pulmonary and had 6 minute walk. Also seen by cardiology in April 14. Immunizations are up to date. Gyn - she will need to schedule appointment for Pelvic exam.  In summary Complex medical patient who does appear stable at this time on her present medications. [facility] as [facility].

## 2014-02-16 NOTE — Assessment & Plan Note (Signed)
Stable on current medications. Reports panic attacks that are aborted with Xanax 2/ week.  Plan Continue current medications

## 2014-02-16 NOTE — Patient Instructions (Addendum)
It has been a pleasure providing care for you today.  1) Health Maintenance - You will need to get a pap smear. It's important to get this test every three years. - Pneumonia vaccine (Pneumovax) today.   2) Weight management - Continue to work on weight loss. This is a long term project. - Goal = 1-2 pounds / week. Goal weight = 120 lbs

## 2014-02-16 NOTE — Assessment & Plan Note (Addendum)
Stable at this time. Switched from Sildenafil to Levitra Recently  Plan Continue present care and current medications  Continue weight loss.   [facility] - hoping to be a candidate for transplant when weight down.

## 2014-02-16 NOTE — Progress Notes (Signed)
Pre visit review using our clinic review tool, if applicable. No additional management support is needed unless otherwise documented below in the visit note. 

## 2014-02-17 NOTE — Assessment & Plan Note (Signed)
Stable on her current regimen. No reports of constipation or mental status changes.

## 2014-02-25 ENCOUNTER — Telehealth: Payer: Self-pay | Admitting: *Deleted

## 2014-02-25 MED ORDER — HYDROCODONE-ACETAMINOPHEN 7.5-325 MG PO TABS: 1.0000 | ORAL_TABLET | ORAL | Status: DC | PRN

## 2014-02-25 NOTE — Telephone Encounter (Signed)
Patient phoned requesting refills on her pain med (Norco last ordered 02/03/14).  Contract in place (11/10/13), but no found tox screen. (requesting 3 month scripts).  Also, c/o cough and congestion & requesting something be phoned in.  Please advise.  CB# [phone]

## 2014-02-25 NOTE — Telephone Encounter (Signed)
Phoned and notified patient that scripts were available for p/u

## 2014-02-25 NOTE — Telephone Encounter (Signed)
Ok for [employer] Rx x 3. URI - any fever, productive sputum, SOB???

## 2014-02-25 NOTE — Telephone Encounter (Signed)
Scripts printed & awaiting MD signature.  Patient denied any fever, sputum or dyspnea.

## 2014-06-01 ENCOUNTER — Telehealth: Payer: Self-pay | Admitting: Adult Health

## 2014-06-01 NOTE — Telephone Encounter (Signed)
Dr. Debby Bud is no longer in practice and the patient is needing her medication before her rescheduled date.   sertraline (ZOLOFT) 100 MG tablet  ALPRAZolam (XANAX) 0.25 MG tablet  HYDROcodone-acetaminophen (NORCO) 7.5-325 MG per tablet

## 2014-06-01 NOTE — Telephone Encounter (Signed)
Not able to refill controlled medications without seeing pt.

## 2014-06-01 NOTE — Telephone Encounter (Signed)
Please review Vanessa's note and advise.

## 2014-06-02 ENCOUNTER — Ambulatory Visit: Payer: Medicaid Other | Admitting: Adult Health

## 2014-06-04 ENCOUNTER — Telehealth: Payer: Self-pay | Admitting: Adult Health

## 2014-06-04 NOTE — Telephone Encounter (Signed)
Left msg for patient to return my call so I can discuss about an earlier appointment. See previous phone note from 06/01/14.

## 2014-06-05 NOTE — Telephone Encounter (Signed)
Patient is scheduled for Monday at 4:15 she is only completely out of her pain medication. I also told Barbara CowerJason and patient that the providers here don't normally prescribe narcotics that we typically send them to the pain clinic.  He said he understands and stated that the patient did have a lot of medical issues and they would keep the appointment on Monday.

## 2014-06-05 NOTE — Telephone Encounter (Signed)
Returned call.  States needs medications.  Advised office manager in meeting at this time.  States to please call [phone].

## 2014-06-08 ENCOUNTER — Ambulatory Visit: Payer: Medicaid Other | Admitting: Adult Health

## 2014-06-09 ENCOUNTER — Telehealth: Payer: Self-pay

## 2014-06-09 ENCOUNTER — Telehealth: Payer: Self-pay | Admitting: Internal Medicine

## 2014-06-09 MED ORDER — HYDROCODONE-ACETAMINOPHEN 7.5-325 MG PO TABS: 1.0000 | ORAL_TABLET | ORAL | Status: DC | PRN

## 2014-06-09 MED ORDER — SERTRALINE HCL 100 MG PO TABS: ORAL_TABLET | ORAL | Status: DC

## 2014-06-09 MED ORDER — ALPRAZOLAM 0.25 MG PO TABS: ORAL_TABLET | ORAL | Status: DC

## 2014-06-09 MED ORDER — PANTOPRAZOLE SODIUM 40 MG PO TBEC: DELAYED_RELEASE_TABLET | ORAL | Status: DC

## 2014-06-09 NOTE — Telephone Encounter (Signed)
PT HAS HX OF CONSISTENTLY LOSING RX REFILL SCRIPTS FOR CONTROLLED SUBSTANCE MEDICATIONS.

## 2014-06-09 NOTE — Addendum Note (Signed)
Addended by: Corwin LevinsJOHN, JAMES W on: 06/09/2014 04:16 PM   Modules accepted: Orders

## 2014-06-09 NOTE — Telephone Encounter (Signed)
Pt notified and rx is at front desk

## 2014-06-09 NOTE — Telephone Encounter (Signed)
Done hardcopy to robin  

## 2014-06-09 NOTE — Telephone Encounter (Signed)
Patient picked up scripts, but did not have June hardcopy. Patient is at the front desk.

## 2014-06-09 NOTE — Telephone Encounter (Signed)
Pt needs rfs for the following medicaitons: zoloft, hydrocodone, xanax and protonix. Please advise

## 2014-06-09 NOTE — Telephone Encounter (Signed)
zoloft and protonix done erx  Other Done hardcopy to robin  Needs to establish with new PCP  I do not normally do chronic pain management anymore.  To consider establish with Dr Dorise HissKollar this fall, and/or referral to pain management

## 2014-06-09 NOTE — Telephone Encounter (Signed)
The patient did pickup hardcopy at the front desk.

## 2014-06-11 ENCOUNTER — Ambulatory Visit: Payer: Medicaid Other | Admitting: Adult Health

## 2014-07-07 ENCOUNTER — Ambulatory Visit: Payer: Medicaid Other | Admitting: Internal Medicine

## 2014-08-12 ENCOUNTER — Telehealth: Payer: Self-pay | Admitting: *Deleted

## 2014-08-12 MED ORDER — NORGESTREL-ETHINYL ESTRADIOL 0.3-30 MG-MCG PO TABS: ORAL_TABLET | ORAL | Status: DC

## 2014-08-12 NOTE — Telephone Encounter (Signed)
Sent enough refills until pt est with Dr. Dorise HissKollar...Raechel Chute/lmb

## 2014-09-09 ENCOUNTER — Telehealth: Payer: Self-pay | Admitting: Internal Medicine

## 2014-09-09 NOTE — Telephone Encounter (Signed)
Patient needs refills on hydrocodone, xanx, zoloft, potassium and birth control.  Patient is out of everything.  Patient has appointment to establish next month.

## 2014-09-10 MED ORDER — POTASSIUM CHLORIDE ER 10 MEQ PO TBCR: 10.0000 meq | EXTENDED_RELEASE_TABLET | Freq: Every day | ORAL | Status: DC

## 2014-09-10 MED ORDER — SERTRALINE HCL 100 MG PO TABS: ORAL_TABLET | ORAL | Status: DC

## 2014-09-10 MED ORDER — NORGESTREL-ETHINYL ESTRADIOL 0.3-30 MG-MCG PO TABS: ORAL_TABLET | ORAL | Status: DC

## 2014-09-10 MED ORDER — HYDROCODONE-ACETAMINOPHEN 7.5-325 MG PO TABS: 1.0000 | ORAL_TABLET | ORAL | Status: DC | PRN

## 2014-09-10 MED ORDER — ALPRAZOLAM 0.25 MG PO TABS: ORAL_TABLET | ORAL | Status: DC

## 2014-09-10 NOTE — Telephone Encounter (Signed)
Refill for one month printed or sent. She will need to know that just because she has refill does not mean that we will not need to adjust some of her medicines at her visit.

## 2014-09-10 NOTE — Telephone Encounter (Signed)
Pt is out of those med, please advise. Pt schedule with Dr. Dorise Hiss 10/02/14. Please call pt

## 2014-10-02 ENCOUNTER — Encounter: Payer: Self-pay | Admitting: Internal Medicine

## 2014-10-02 ENCOUNTER — Ambulatory Visit (INDEPENDENT_AMBULATORY_CARE_PROVIDER_SITE_OTHER): Payer: Medicare Other | Admitting: Internal Medicine

## 2014-10-02 ENCOUNTER — Other Ambulatory Visit (INDEPENDENT_AMBULATORY_CARE_PROVIDER_SITE_OTHER): Payer: Medicaid Other

## 2014-10-02 VITALS — BP 126/70 | HR 77 | Temp 98.3°F | Resp 16 | Ht 60.0 in | Wt 183.4 lb

## 2014-10-02 DIAGNOSIS — J3089 Other allergic rhinitis: Secondary | ICD-10-CM

## 2014-10-02 DIAGNOSIS — I272 Other secondary pulmonary hypertension: Secondary | ICD-10-CM

## 2014-10-02 DIAGNOSIS — K219 Gastro-esophageal reflux disease without esophagitis: Secondary | ICD-10-CM

## 2014-10-02 DIAGNOSIS — I509 Heart failure, unspecified: Secondary | ICD-10-CM

## 2014-10-02 DIAGNOSIS — Z23 Encounter for immunization: Secondary | ICD-10-CM | POA: Diagnosis not present

## 2014-10-02 DIAGNOSIS — I270 Primary pulmonary hypertension: Secondary | ICD-10-CM

## 2014-10-02 DIAGNOSIS — G894 Chronic pain syndrome: Secondary | ICD-10-CM

## 2014-10-02 DIAGNOSIS — Z0000 Encounter for general adult medical examination without abnormal findings: Secondary | ICD-10-CM

## 2014-10-02 DIAGNOSIS — I50810 Right heart failure, unspecified: Secondary | ICD-10-CM

## 2014-10-02 DIAGNOSIS — F419 Anxiety disorder, unspecified: Secondary | ICD-10-CM

## 2014-10-02 DIAGNOSIS — I2729 Other secondary pulmonary hypertension: Secondary | ICD-10-CM

## 2014-10-02 LAB — COMPREHENSIVE METABOLIC PANEL
ALK PHOS: 78 U/L (ref 39–117)
ALT: 70 U/L — ABNORMAL HIGH (ref 0–35)
AST: 53 U/L — ABNORMAL HIGH (ref 0–37)
Albumin: 3.4 g/dL — ABNORMAL LOW (ref 3.5–5.2)
BILIRUBIN TOTAL: 0.4 mg/dL (ref 0.2–1.2)
BUN: 17 mg/dL (ref 6–23)
CO2: 32 meq/L (ref 19–32)
CREATININE: 0.6 mg/dL (ref 0.4–1.2)
Calcium: 9 mg/dL (ref 8.4–10.5)
Chloride: 101 mEq/L (ref 96–112)
GFR: 137.95 mL/min (ref >60.00)
Glucose, Bld: 104 mg/dL — ABNORMAL HIGH (ref 70–99)
Potassium: 4 mEq/L (ref 3.5–5.1)
Sodium: 140 mEq/L (ref 135–145)
Total Protein: 7.3 g/dL (ref 6.0–8.3)

## 2014-10-02 MED ORDER — ALBUTEROL SULFATE HFA 108 (90 BASE) MCG/ACT IN AERS: 2.0000 | INHALATION_SPRAY | Freq: Four times a day (QID) | RESPIRATORY_TRACT | Status: DC | PRN

## 2014-10-02 MED ORDER — DICLOFENAC SODIUM 1 % TD GEL: 2.0000 g | Freq: Three times a day (TID) | TRANSDERMAL | Status: DC | PRN

## 2014-10-02 MED ORDER — PANTOPRAZOLE SODIUM 40 MG PO TBEC: DELAYED_RELEASE_TABLET | ORAL | Status: DC

## 2014-10-02 MED ORDER — SERTRALINE HCL 100 MG PO TABS: 200.0000 mg | ORAL_TABLET | Freq: Every day | ORAL | Status: DC

## 2014-10-02 MED ORDER — NORGESTREL-ETHINYL ESTRADIOL 0.3-30 MG-MCG PO TABS: ORAL_TABLET | ORAL | Status: DC

## 2014-10-02 NOTE — Assessment & Plan Note (Signed)
Double zoloft to 200 mg today for poor control overall of her anxiety. She does not wish to switch agents at this time because she feels it has helped some. Would like to see her off the xanax prn once her anxiety is better controlled.

## 2014-10-02 NOTE — Assessment & Plan Note (Signed)
Takes protonix and no problems.

## 2014-10-02 NOTE — Assessment & Plan Note (Signed)
Not complaining of problems currently.

## 2014-10-02 NOTE — Assessment & Plan Note (Signed)
She follows with [facility] cardiology and pulmonology. Needs monitoring of FTs every month. Check CMP today and pregnancy test. Refill of her birth control pills. Taking opsumit (not on right now due to no one monitoring labs) and lasix right now.

## 2014-10-02 NOTE — Assessment & Plan Note (Signed)
Flu shot given at today's visit. 

## 2014-10-02 NOTE — Progress Notes (Signed)
Pre visit review using our clinic review tool, if applicable. No additional management support is needed unless otherwise documented below in the visit note. 

## 2014-10-02 NOTE — Progress Notes (Signed)
   Subjective:    Patient ID: [name], female    DOB: June 30, 1986, 28 y.o.   MRN: [id number]  HPI The patient is a 28 year old female who comes in today to establish care. She has medical history of pathology of the low surgically corrected, right heart failure do to pulmonary hypertension status post correction, GERD, scoliosis causing chronic pain, anxiety due to respiratory issues. She is doing well overall today. She thinks that she is still having about 2-3 panic attacks per week. She is still SOB with most exertion but is able to walk short distances. No chest pains, abdominal pain, constipation, diarrhea. She is with her fiance who helps to provide this history.   Review of Systems  Constitutional: Negative for fever, activity change, appetite change and fatigue.  HENT: Negative.   Eyes: Negative.   Respiratory: Negative for cough, chest tightness, shortness of breath and wheezing.   Gastrointestinal: Negative for abdominal pain, diarrhea, constipation and abdominal distention.  Endocrine: Negative.   Genitourinary: Negative.   Musculoskeletal: Positive for arthralgias and back pain. Negative for gait problem.  Neurological: Negative for dizziness, weakness, light-headedness and headaches.  Psychiatric/Behavioral: Negative for suicidal ideas and self-injury. The patient is nervous/anxious.       Objective:   Physical Exam  Constitutional: She is oriented to person, place, and time. She appears well-developed.  Smelling strongly of urine. Overweight.  HENT:  Head: Normocephalic and atraumatic.  Eyes: EOM are normal.  Neck: Normal range of motion.  Cardiovascular: Normal rate.   Murmurs noted.   Pulmonary/Chest: Effort normal and breath sounds normal.  Abdominal: Soft. Bowel sounds are normal.  Neurological: She is alert and oriented to person, place, and time. Coordination normal.   Filed Vitals:   10/02/14 1308  BP: 126/70  Pulse: 77  Temp: 98.3 F (36.8 C)    TempSrc: Oral  Resp: 16  Height: 5' (1.524 m)  Weight: 183 lb 6.4 oz (83.19 kg)  SpO2: 97%      Assessment & Plan:  Flu shot given today.

## 2014-10-02 NOTE — Assessment & Plan Note (Signed)
She does take a lot of pain medication (has been getting 180 per mont) and will see if we can decrease this requirement or switch to long acting for better overall control.

## 2014-10-02 NOTE — Patient Instructions (Signed)
We will have you increase your zoloft to 200 mg (2 tablets) once a day. This may help with your panic attacks more. We will check your blood work today.   Come back in about 6 months for a check up or sooner if you have problems or questions.

## 2014-10-03 LAB — PREGNANCY, URINE: PREG TEST UR: NEGATIVE

## 2014-10-06 ENCOUNTER — Telehealth: Payer: Self-pay | Admitting: Internal Medicine

## 2014-10-06 NOTE — Telephone Encounter (Signed)
Patient would like a refill of Hydrocodone 7.5

## 2014-10-06 NOTE — Telephone Encounter (Signed)
Message copied by Barbette ReichmannWRENN, Furious Chiarelli E on Tue Oct 06, 2014  2:35 PM ------      Message from: Genella MechKOLLAR, ELIZABETH A      Created: Tue Oct 06, 2014  1:40 PM       Please call and let her know that her liver enzymes are a little higher than normal but within 2 times the upper limit. Your kidneys are normal. ------

## 2014-10-06 NOTE — Telephone Encounter (Signed)
Left message for patient to call me back. This cannot be filled yet.

## 2014-10-06 NOTE — Telephone Encounter (Signed)
Called pt to let her know lab results. Left no answer left message.

## 2014-10-08 ENCOUNTER — Other Ambulatory Visit: Payer: Self-pay | Admitting: Geriatric Medicine

## 2014-10-08 MED ORDER — HYDROCODONE-ACETAMINOPHEN 7.5-325 MG PO TABS: 1.0000 | ORAL_TABLET | ORAL | Status: DC | PRN

## 2014-10-09 NOTE — Telephone Encounter (Signed)
Pt called stating that they had Dr. Dorise HissKollar increased the dosage of zoloft and they are concerned that if they go to the pharmacy to pick it up with will be the lesser dosage. Wanted to know if you could call in the higher dosage in.

## 2014-10-13 MED ORDER — SERTRALINE HCL 100 MG PO TABS: 200.0000 mg | ORAL_TABLET | Freq: Every day | ORAL | Status: DC

## 2014-10-13 NOTE — Telephone Encounter (Signed)
Pt has not had Zoloft in a few days, pt now tasting metal in her mouth, requesting refill

## 2014-10-13 NOTE — Telephone Encounter (Signed)
The correct sig was sent in at the date of our visit and should have been at her pharmacy [facility] [address].

## 2014-10-13 NOTE — Telephone Encounter (Signed)
Notified pt with md response.../lmb 

## 2014-10-16 ENCOUNTER — Other Ambulatory Visit: Payer: Self-pay | Admitting: Geriatric Medicine

## 2014-11-05 ENCOUNTER — Telehealth: Payer: Self-pay | Admitting: Internal Medicine

## 2014-11-05 NOTE — Telephone Encounter (Signed)
Pt called in requesting refill on hydrocodone .  It due Monday.

## 2014-11-09 ENCOUNTER — Other Ambulatory Visit: Payer: Self-pay | Admitting: Geriatric Medicine

## 2014-11-09 MED ORDER — HYDROCODONE-ACETAMINOPHEN 7.5-325 MG PO TABS: 1.0000 | ORAL_TABLET | ORAL | Status: DC | PRN

## 2014-11-09 NOTE — Telephone Encounter (Signed)
Printed and placed up front to be picked up.

## 2014-11-10 ENCOUNTER — Other Ambulatory Visit: Payer: Self-pay | Admitting: Internal Medicine

## 2014-11-16 ENCOUNTER — Other Ambulatory Visit: Payer: Self-pay | Admitting: Geriatric Medicine

## 2014-11-16 MED ORDER — ALBUTEROL SULFATE HFA 108 (90 BASE) MCG/ACT IN AERS: 2.0000 | INHALATION_SPRAY | Freq: Four times a day (QID) | RESPIRATORY_TRACT | Status: DC | PRN

## 2014-12-04 ENCOUNTER — Other Ambulatory Visit: Payer: Self-pay | Admitting: Internal Medicine

## 2014-12-04 ENCOUNTER — Telehealth: Payer: Self-pay | Admitting: *Deleted

## 2014-12-04 MED ORDER — HYDROCODONE-ACETAMINOPHEN 7.5-325 MG PO TABS: 1.0000 | ORAL_TABLET | ORAL | Status: DC | PRN

## 2014-12-04 NOTE — Telephone Encounter (Signed)
I spoke with patient and informed her that she could pick up her prescription on Monday.

## 2014-12-04 NOTE — Telephone Encounter (Signed)
Call and let her know she can pick up on Monday.

## 2014-12-04 NOTE — Telephone Encounter (Signed)
Left msg on triage requesting refill on her hydrocodone.../lmb 

## 2014-12-09 ENCOUNTER — Other Ambulatory Visit: Payer: Self-pay | Admitting: Internal Medicine

## 2015-01-06 ENCOUNTER — Telehealth: Payer: Self-pay | Admitting: Internal Medicine

## 2015-01-06 ENCOUNTER — Other Ambulatory Visit: Payer: Self-pay | Admitting: Geriatric Medicine

## 2015-01-06 MED ORDER — HYDROCODONE-ACETAMINOPHEN 7.5-325 MG PO TABS: 1.0000 | ORAL_TABLET | ORAL | Status: DC | PRN

## 2015-01-06 NOTE — Telephone Encounter (Signed)
Pt called in requesting refill on her HYDROcodone-acetaminophen (NORCO) 7.5-325 MG per tablet [[id number]][[id number]]

## 2015-01-06 NOTE — Telephone Encounter (Signed)
Printed, signed and placed in the front cabinet. Patient aware and will come pick up.

## 2015-02-03 ENCOUNTER — Telehealth: Payer: Self-pay | Admitting: *Deleted

## 2015-02-03 NOTE — Telephone Encounter (Signed)
Left msg on triage requesting refill on her hydrocodone.../lmb 

## 2015-02-04 MED ORDER — HYDROCODONE-ACETAMINOPHEN 7.5-325 MG PO TABS: 1.0000 | ORAL_TABLET | ORAL | Status: DC | PRN

## 2015-02-04 NOTE — Telephone Encounter (Signed)
Notified jason rx ready for pick-up.Marland Kitchen.Raechel Chute/lmb

## 2015-02-04 NOTE — Telephone Encounter (Signed)
Printed and signed.  

## 2015-02-05 ENCOUNTER — Other Ambulatory Visit: Payer: Self-pay | Admitting: Internal Medicine

## 2015-02-12 ENCOUNTER — Other Ambulatory Visit: Payer: Self-pay | Admitting: Geriatric Medicine

## 2015-02-12 MED ORDER — ALPRAZOLAM 0.25 MG PO TABS: ORAL_TABLET | ORAL | Status: DC

## 2015-03-05 ENCOUNTER — Telehealth: Payer: Self-pay | Admitting: *Deleted

## 2015-03-05 MED ORDER — HYDROCODONE-ACETAMINOPHEN 7.5-325 MG PO TABS: 1.0000 | ORAL_TABLET | ORAL | Status: DC | PRN

## 2015-03-05 NOTE — Telephone Encounter (Signed)
Printed and signed.  

## 2015-03-05 NOTE — Telephone Encounter (Signed)
Left msg on triage needing refill on her hydrocodone.../lmb 

## 2015-03-05 NOTE — Telephone Encounter (Signed)
Tried calling pt no answer x;s 10 rings no vm. Put rx in cabinet for pick-up...[name]/lmb

## 2015-03-15 ENCOUNTER — Other Ambulatory Visit: Payer: Self-pay | Admitting: Internal Medicine

## 2015-03-16 ENCOUNTER — Other Ambulatory Visit: Payer: Self-pay | Admitting: Geriatric Medicine

## 2015-03-16 MED ORDER — ALPRAZOLAM 0.25 MG PO TABS: ORAL_TABLET | ORAL | Status: DC

## 2015-03-27 ENCOUNTER — Other Ambulatory Visit: Payer: Self-pay | Admitting: Internal Medicine

## 2015-04-05 ENCOUNTER — Other Ambulatory Visit: Payer: Self-pay | Admitting: Geriatric Medicine

## 2015-04-05 ENCOUNTER — Telehealth: Payer: Self-pay | Admitting: Internal Medicine

## 2015-04-05 MED ORDER — HYDROCODONE-ACETAMINOPHEN 7.5-325 MG PO TABS: 1.0000 | ORAL_TABLET | ORAL | Status: DC | PRN

## 2015-04-05 NOTE — Telephone Encounter (Signed)
Pt called in and left message that she needs refill on her HYDROcodone-acetaminophen (NORCO) 7.5-325 MG per tablet [[id number]][[id number]]

## 2015-04-05 NOTE — Telephone Encounter (Signed)
Printed and waiting for signature. 

## 2015-04-05 NOTE — Telephone Encounter (Signed)
Signed and patient will come and pick up. She will have to schedule another office visit before she gets another refill and she is aware of that as well.

## 2015-04-15 ENCOUNTER — Other Ambulatory Visit: Payer: Self-pay | Admitting: Internal Medicine

## 2015-04-15 NOTE — Telephone Encounter (Signed)
Notified pharmacy spoke with Zollie ScaleOlivia gave md approval.../lmb

## 2015-05-03 ENCOUNTER — Encounter: Payer: Self-pay | Admitting: Internal Medicine

## 2015-05-03 ENCOUNTER — Other Ambulatory Visit (INDEPENDENT_AMBULATORY_CARE_PROVIDER_SITE_OTHER): Payer: Medicare Other

## 2015-05-03 ENCOUNTER — Ambulatory Visit (INDEPENDENT_AMBULATORY_CARE_PROVIDER_SITE_OTHER): Payer: Medicare Other | Admitting: Internal Medicine

## 2015-05-03 VITALS — BP 140/70 | HR 78 | Wt 185.0 lb

## 2015-05-03 DIAGNOSIS — R7302 Impaired glucose tolerance (oral): Secondary | ICD-10-CM

## 2015-05-03 DIAGNOSIS — I272 Other secondary pulmonary hypertension: Secondary | ICD-10-CM

## 2015-05-03 DIAGNOSIS — E789 Disorder of lipoprotein metabolism, unspecified: Secondary | ICD-10-CM | POA: Diagnosis not present

## 2015-05-03 DIAGNOSIS — I50810 Right heart failure, unspecified: Secondary | ICD-10-CM

## 2015-05-03 DIAGNOSIS — Z79891 Long term (current) use of opiate analgesic: Secondary | ICD-10-CM | POA: Diagnosis not present

## 2015-05-03 DIAGNOSIS — I270 Primary pulmonary hypertension: Secondary | ICD-10-CM | POA: Diagnosis not present

## 2015-05-03 DIAGNOSIS — I509 Heart failure, unspecified: Secondary | ICD-10-CM

## 2015-05-03 DIAGNOSIS — Z79899 Other long term (current) drug therapy: Secondary | ICD-10-CM | POA: Diagnosis not present

## 2015-05-03 DIAGNOSIS — I2729 Other secondary pulmonary hypertension: Secondary | ICD-10-CM

## 2015-05-03 DIAGNOSIS — G894 Chronic pain syndrome: Secondary | ICD-10-CM

## 2015-05-03 LAB — LDL CHOLESTEROL, DIRECT: Direct LDL: 124 mg/dL

## 2015-05-03 LAB — CBC
HEMATOCRIT: 41.4 % (ref 36.0–46.0)
HEMOGLOBIN: 13.8 g/dL (ref 12.0–15.0)
MCHC: 33.2 g/dL (ref 30.0–36.0)
MCV: 84.1 fl (ref 78.0–100.0)
Platelets: 276 10*3/uL (ref 150.0–400.0)
RBC: 4.93 Mil/uL (ref 3.87–5.11)
RDW: 14.7 % (ref 11.5–15.5)
WBC: 8.6 10*3/uL (ref 4.0–10.5)

## 2015-05-03 LAB — LIPID PANEL
CHOLESTEROL: 221 mg/dL — AB (ref 0–200)
HDL: 52.5 mg/dL (ref >39.00)
NonHDL: 168.5
Total CHOL/HDL Ratio: 4
Triglycerides: 236 mg/dL — ABNORMAL HIGH (ref 0.0–149.0)
VLDL: 47.2 mg/dL — ABNORMAL HIGH (ref 0.0–40.0)

## 2015-05-03 LAB — COMPREHENSIVE METABOLIC PANEL
ALBUMIN: 4.5 g/dL (ref 3.5–5.2)
ALK PHOS: 90 U/L (ref 39–117)
ALT: 48 U/L — ABNORMAL HIGH (ref 0–35)
AST: 28 U/L (ref 0–37)
BUN: 14 mg/dL (ref 6–23)
CALCIUM: 9.8 mg/dL (ref 8.4–10.5)
CO2: 32 mEq/L (ref 19–32)
Chloride: 96 mEq/L (ref 96–112)
Creatinine, Ser: 0.67 mg/dL (ref 0.40–1.20)
GFR: 109.42 mL/min (ref >60.00)
Glucose, Bld: 149 mg/dL — ABNORMAL HIGH (ref 70–99)
POTASSIUM: 4.2 meq/L (ref 3.5–5.1)
SODIUM: 135 meq/L (ref 135–145)
TOTAL PROTEIN: 8 g/dL (ref 6.0–8.3)
Total Bilirubin: 0.6 mg/dL (ref 0.2–1.2)

## 2015-05-03 LAB — HEMOGLOBIN A1C: HEMOGLOBIN A1C: 6.3 % (ref 4.6–6.5)

## 2015-05-03 MED ORDER — SERTRALINE HCL 100 MG PO TABS: 200.0000 mg | ORAL_TABLET | Freq: Every day | ORAL | Status: DC

## 2015-05-03 MED ORDER — HYDROCODONE-ACETAMINOPHEN 7.5-325 MG PO TABS: 1.0000 | ORAL_TABLET | ORAL | Status: DC | PRN

## 2015-05-03 NOTE — Patient Instructions (Addendum)
We will check on the blood and urine today and call you back with the results. We have refilled the medicines you need refilled.   Come back in about 6 months and please feel free to call us if you are having any problems or questions before then.   Exercise to Stay Healthy Exercise helps you become and stay healthy. EXERCISE IDEAS AND TIPS Choose exercises that:  You enjoy.  Fit into your day. You do not need to exercise really hard to be healthy. You can do exercises at a slow or medium level and stay healthy. You can:  Stretch before and after working out.  Try yoga, Pilates, or tai chi.  Lift weights.  Walk fast, swim, jog, run, climb stairs, bicycle, dance, or rollerskate.  Take aerobic classes. Exercises that burn about 150 calories:  Running 1  miles in 15 minutes.  Playing volleyball for 45 to 60 minutes.  Washing and waxing a car for 45 to 60 minutes.  Playing touch football for 45 minutes.  Walking 1  miles in 35 minutes.  Pushing a stroller 1  miles in 30 minutes.  Playing basketball for 30 minutes.  Raking leaves for 30 minutes.  Bicycling 5 miles in 30 minutes.  Walking 2 miles in 30 minutes.  Dancing for 30 minutes.  Shoveling snow for 15 minutes.  Swimming laps for 20 minutes.  Walking up stairs for 15 minutes.  Bicycling 4 miles in 15 minutes.  Gardening for 30 to 45 minutes.  Jumping rope for 15 minutes.  Washing windows or floors for 45 to 60 minutes. Document Released: 01/13/2011 Document Revised: 03/04/2012 Document Reviewed: 01/13/2011 [facility] Patient Information 2015 [place], [place]. This information is not intended to replace advice given to you by your health care provider. Make sure you discuss any questions you have with your health care provider.

## 2015-05-03 NOTE — Progress Notes (Signed)
Pre visit review using our clinic review tool, if applicable. No additional management support is needed unless otherwise documented below in the visit note. 

## 2015-05-04 ENCOUNTER — Telehealth: Payer: Self-pay | Admitting: Internal Medicine

## 2015-05-04 MED ORDER — AMOXICILLIN 500 MG PO CAPS: 500.0000 mg | ORAL_CAPSULE | Freq: Three times a day (TID) | ORAL | Status: DC

## 2015-05-04 NOTE — Assessment & Plan Note (Signed)
She continues to follow with tertiary center. Talked to her about the concerning trend in her weights which will put more strain on her heart which is not good.

## 2015-05-04 NOTE — Progress Notes (Signed)
   Subjective:    Patient ID: [name], female    DOB: 1986-01-21, 29 y.o.   MRN: [id number]  HPI The patient is a 29 YO female who is coming in for follow up of her chronic pain. She is doing well overall. She is still using about the same amount of her medication. It allows her to be functional and do what she needs to do. Denies significant constipation although she does use OTC medications to prevent that. Denies confusion or unintentional overdose. She does not take extra doses. She does not run out early.   Review of Systems  Constitutional: Negative for fever, activity change, appetite change and fatigue.  HENT: Negative.   Eyes: Negative.   Respiratory: Negative for cough, chest tightness, shortness of breath and wheezing.   Gastrointestinal: Negative for abdominal pain, diarrhea, constipation and abdominal distention.  Endocrine: Negative.   Genitourinary: Negative.   Musculoskeletal: Positive for back pain and arthralgias. Negative for gait problem.  Neurological: Negative for dizziness, weakness, light-headedness and headaches.  Psychiatric/Behavioral: Negative for suicidal ideas and self-injury. The patient is nervous/anxious.       Objective:   Physical Exam  Constitutional: She is oriented to person, place, and time. She appears well-developed.  Overweight.  HENT:  Head: Normocephalic and atraumatic.  Eyes: EOM are normal.  Neck: Normal range of motion.  Cardiovascular: Normal rate.   Murmurs noted.   Pulmonary/Chest: Effort normal and breath sounds normal.  Abdominal: Soft. Bowel sounds are normal.  Neurological: She is alert and oriented to person, place, and time. Coordination normal.   Filed Vitals:   05/03/15 1539  BP: 140/70  Pulse: 78  Weight: 185 lb (83.915 kg)  SpO2: 97%      Assessment & Plan:

## 2015-05-04 NOTE — Assessment & Plan Note (Signed)
Check UDS, refilled her medication. She is low risk for abuse. Did go over the possible risks and harms from chronic narcotic usage including addiction, GI problems, unintentional overdose. She has no new concerns. Norco 7.5/325 number 180 no refills given at visit.

## 2015-05-04 NOTE — Telephone Encounter (Signed)
Sent in amoxicillin TID for 10 days.

## 2015-05-04 NOTE — Telephone Encounter (Signed)
[facility]  Called in and said that pt needs antibiotic for tooth .  She said that she had mentioned it to Dr Dorise HissKollar when she was in there.     Best number  [phone]

## 2015-05-18 ENCOUNTER — Ambulatory Visit: Payer: Medicare Other | Admitting: Internal Medicine

## 2015-06-01 ENCOUNTER — Telehealth: Payer: Self-pay | Admitting: Internal Medicine

## 2015-06-01 MED ORDER — HYDROCODONE-ACETAMINOPHEN 7.5-325 MG PO TABS: 1.0000 | ORAL_TABLET | ORAL | Status: DC | PRN

## 2015-06-01 NOTE — Telephone Encounter (Signed)
Pt called in for refill on her HYDROcodone-acetaminophen (NORCO) 7.5-325 MG per tablet [[id number]][[id number]]

## 2015-06-01 NOTE — Telephone Encounter (Signed)
Printed and signed, please call.

## 2015-06-01 NOTE — Telephone Encounter (Signed)
Pt advised via my chart.

## 2015-06-01 NOTE — Telephone Encounter (Signed)
Pt advised.

## 2015-06-02 ENCOUNTER — Other Ambulatory Visit: Payer: Self-pay | Admitting: Internal Medicine

## 2015-06-02 NOTE — Telephone Encounter (Signed)
Faxed to [facility]

## 2015-06-08 ENCOUNTER — Encounter: Payer: Self-pay | Admitting: Internal Medicine

## 2015-06-29 ENCOUNTER — Telehealth: Payer: Self-pay | Admitting: Internal Medicine

## 2015-06-29 MED ORDER — HYDROCODONE-ACETAMINOPHEN 7.5-325 MG PO TABS: 1.0000 | ORAL_TABLET | ORAL | Status: DC | PRN

## 2015-06-29 NOTE — Telephone Encounter (Signed)
Patient is needing a refill for HYDROcodone-acetaminophen (NORCO) 7.5-325 MG per tablet [[id number]][[id number]]   Please call [phone]

## 2015-06-29 NOTE — Telephone Encounter (Signed)
norco rx placed at front office, patient advised ready for pick up

## 2015-06-29 NOTE — Telephone Encounter (Signed)
Printed and signed.  

## 2015-07-07 ENCOUNTER — Other Ambulatory Visit: Payer: Self-pay | Admitting: Internal Medicine

## 2015-07-08 ENCOUNTER — Other Ambulatory Visit: Payer: Self-pay

## 2015-07-08 MED ORDER — POTASSIUM CHLORIDE ER 10 MEQ PO TBCR: 10.0000 meq | EXTENDED_RELEASE_TABLET | Freq: Every day | ORAL | Status: DC

## 2015-07-22 ENCOUNTER — Encounter: Payer: Self-pay | Admitting: Internal Medicine

## 2015-07-28 ENCOUNTER — Telehealth: Payer: Self-pay | Admitting: *Deleted

## 2015-07-28 NOTE — Telephone Encounter (Signed)
Received call from pt husband requesting refill on hydrocodone...Raechel Chute

## 2015-07-29 ENCOUNTER — Other Ambulatory Visit: Payer: Self-pay | Admitting: Internal Medicine

## 2015-07-29 MED ORDER — HYDROCODONE-ACETAMINOPHEN 7.5-325 MG PO TABS: 1.0000 | ORAL_TABLET | ORAL | Status: DC | PRN

## 2015-07-29 NOTE — Telephone Encounter (Signed)
done

## 2015-07-29 NOTE — Telephone Encounter (Signed)
Notified [name] rx ready for pick-up. Place rx in cabinet...Raechel Chute

## 2015-07-29 NOTE — Telephone Encounter (Signed)
MD is out of office. Is this ok to be refill...[name]

## 2015-07-30 ENCOUNTER — Other Ambulatory Visit: Payer: Self-pay

## 2015-07-30 MED ORDER — PANTOPRAZOLE SODIUM 40 MG PO TBEC: DELAYED_RELEASE_TABLET | ORAL | Status: DC

## 2015-08-03 ENCOUNTER — Telehealth: Payer: Self-pay | Admitting: Internal Medicine

## 2015-08-03 MED ORDER — HYDROCODONE-ACETAMINOPHEN 7.5-325 MG PO TABS: 1.0000 | ORAL_TABLET | ORAL | Status: DC | PRN

## 2015-08-03 NOTE — Telephone Encounter (Signed)
Patient aware.

## 2015-08-03 NOTE — Telephone Encounter (Signed)
Received a call from [place]. Re: HYDROcodone-acetaminophen (NORCO) 7.5-325 MG per tablet [[id number]] . Sig dated 07/29/2015 states 1 tab every 4 hours with a quantity of 30. This is a 5 day script, meaning that the meds are out as of today. [name] fears that the patient will experience withdrawals which will give an eventual larger issue with her heart problems. He is persistent that they need a script to last until dr Dorise Hiss comes back. Please take a look and update jason asap.

## 2015-08-03 NOTE — Telephone Encounter (Signed)
Medication refilled and signed for pickup in PCP absence.

## 2015-08-27 ENCOUNTER — Telehealth: Payer: Self-pay | Admitting: *Deleted

## 2015-08-27 MED ORDER — HYDROCODONE-ACETAMINOPHEN 7.5-325 MG PO TABS: 1.0000 | ORAL_TABLET | ORAL | Status: DC | PRN

## 2015-08-27 NOTE — Telephone Encounter (Signed)
Notified pt rx ready for pick-up.../lmb 

## 2015-08-27 NOTE — Telephone Encounter (Signed)
Receive call pt needing refill on her hydrocodone...[name]

## 2015-08-27 NOTE — Telephone Encounter (Signed)
Printed and signed, please call for pickup.

## 2015-09-27 ENCOUNTER — Telehealth: Payer: Self-pay | Admitting: *Deleted

## 2015-09-27 MED ORDER — HYDROCODONE-ACETAMINOPHEN 7.5-325 MG PO TABS: 1.0000 | ORAL_TABLET | ORAL | Status: DC | PRN

## 2015-09-27 NOTE — Telephone Encounter (Signed)
Requesting refill on pt hydrocodone...Raechel Chute

## 2015-09-27 NOTE — Telephone Encounter (Signed)
Notified pt husband [name]) rx ready for pick-up...Raechel Chute

## 2015-09-27 NOTE — Telephone Encounter (Signed)
Printed and signed.  

## 2015-09-30 ENCOUNTER — Other Ambulatory Visit: Payer: Self-pay

## 2015-09-30 MED ORDER — SERTRALINE HCL 100 MG PO TABS: 200.0000 mg | ORAL_TABLET | Freq: Every day | ORAL | Status: DC

## 2015-10-26 ENCOUNTER — Telehealth: Payer: Self-pay | Admitting: *Deleted

## 2015-10-26 MED ORDER — HYDROCODONE-ACETAMINOPHEN 7.5-325 MG PO TABS: 1.0000 | ORAL_TABLET | ORAL | Status: DC | PRN

## 2015-10-26 NOTE — Telephone Encounter (Signed)
Tried calling pt again still no answer can't leave msg on vm due to being full. Place rx upfront for pick-up...[name]/lmb

## 2015-10-26 NOTE — Telephone Encounter (Signed)
Requesting refill on wife pain med hydrocodone...Raechel Chute/lmb

## 2015-10-26 NOTE — Telephone Encounter (Signed)
Printed and signed.  

## 2015-10-26 NOTE — Telephone Encounter (Signed)
Tried calling pt/husband no answer can't leave msg due to vm being full...[name]/lmb

## 2015-10-27 ENCOUNTER — Other Ambulatory Visit: Payer: Self-pay | Admitting: Internal Medicine

## 2015-11-11 ENCOUNTER — Other Ambulatory Visit: Payer: Self-pay | Admitting: Internal Medicine

## 2015-11-22 ENCOUNTER — Telehealth: Payer: Self-pay | Admitting: *Deleted

## 2015-11-22 ENCOUNTER — Other Ambulatory Visit: Payer: Self-pay | Admitting: Internal Medicine

## 2015-11-22 NOTE — Telephone Encounter (Signed)
Left msg on triage requesting refill on pt pain med hydrocodone...Raechel Chute/lmb

## 2015-11-23 MED ORDER — HYDROCODONE-ACETAMINOPHEN 7.5-325 MG PO TABS: 1.0000 | ORAL_TABLET | ORAL | Status: DC | PRN

## 2015-11-23 NOTE — Telephone Encounter (Signed)
Notified Barbara CowerJason with md response made appt for 12/22/15...Raechel Chute/lmb

## 2015-11-23 NOTE — Telephone Encounter (Signed)
Pls advise on msg below.../lmb 

## 2015-11-23 NOTE — Telephone Encounter (Signed)
She is overdue for visit. Will fill but needs visit for any further refills.

## 2015-12-22 ENCOUNTER — Other Ambulatory Visit: Payer: Self-pay | Admitting: Internal Medicine

## 2015-12-22 ENCOUNTER — Encounter: Payer: Self-pay | Admitting: Internal Medicine

## 2015-12-22 ENCOUNTER — Ambulatory Visit (INDEPENDENT_AMBULATORY_CARE_PROVIDER_SITE_OTHER): Payer: Medicare Other | Admitting: Internal Medicine

## 2015-12-22 VITALS — BP 132/58 | HR 104 | Temp 98.3°F | Resp 12 | Ht 60.0 in | Wt 189.0 lb

## 2015-12-22 DIAGNOSIS — Z23 Encounter for immunization: Secondary | ICD-10-CM

## 2015-12-22 DIAGNOSIS — I272 Other secondary pulmonary hypertension: Secondary | ICD-10-CM

## 2015-12-22 DIAGNOSIS — I2729 Other secondary pulmonary hypertension: Secondary | ICD-10-CM

## 2015-12-22 DIAGNOSIS — G894 Chronic pain syndrome: Secondary | ICD-10-CM | POA: Diagnosis not present

## 2015-12-22 DIAGNOSIS — I50810 Right heart failure, unspecified: Secondary | ICD-10-CM

## 2015-12-22 MED ORDER — HYDROCODONE-ACETAMINOPHEN 7.5-325 MG PO TABS: 1.0000 | ORAL_TABLET | ORAL | Status: DC | PRN

## 2015-12-22 NOTE — Progress Notes (Signed)
Pre visit review using our clinic review tool, if applicable. No additional management support is needed unless otherwise documented below in the visit note. 

## 2015-12-22 NOTE — Progress Notes (Signed)
   Subjective:    Patient ID: [name], female    DOB: Jun 07, 1986, 29 y.o.   MRN: [id number]  HPI The patient is a 29 YO female coming in for re-certification of her oxygen. She does use 3L O2 all the time. Occasionally more with exertion. She uses with rest and exertion and night time. O2 sat in the office dropped to 87% off O2 without exertion. She denies any new problems. Still getting treatment for her pulmonary hypertension from tertiary care center. Needs refills on several medicines.   Review of Systems  Constitutional: Negative for fever, activity change, appetite change and fatigue.  HENT: Negative.   Respiratory: Negative for cough, chest tightness, shortness of breath and wheezing.   Gastrointestinal: Negative for abdominal pain, diarrhea, constipation and abdominal distention.  Genitourinary: Negative.   Musculoskeletal: Positive for back pain and arthralgias. Negative for gait problem.  Neurological: Negative for dizziness, weakness, light-headedness and headaches.  Psychiatric/Behavioral: Negative for suicidal ideas and self-injury.      Objective:   Physical Exam  Constitutional: She is oriented to person, place, and time. She appears well-developed.  Overweight.  HENT:  Head: Normocephalic and atraumatic.  Eyes: EOM are normal.  Neck: Normal range of motion.  Cardiovascular: Normal rate.   Murmurs noted.   Pulmonary/Chest: Effort normal and breath sounds normal.  Abdominal: Soft. Bowel sounds are normal.  Neurological: She is alert and oriented to person, place, and time. Coordination normal.  Skin: Skin is warm and dry.   Filed Vitals:   12/22/15 1051  BP: 132/58  Pulse: 104  Temp: 98.3 F (36.8 C)  TempSrc: Oral  Resp: 12  Height: 5' (1.524 m)  Weight: 189 lb (85.73 kg)  SpO2: 89%      Assessment & Plan:  Flu shot given at visit.

## 2015-12-22 NOTE — Patient Instructions (Signed)
We have finished the papers for the oxygen and refilled the medicine.   We have given you the flu shot today.   Come back in about 6 months for a check up or feel free to call us sooner if you have any problems or questions.

## 2015-12-23 NOTE — Assessment & Plan Note (Signed)
Has her contract for controlled substances, refill provided today, aware of the risks and possible harms of long term opioid usage.

## 2015-12-23 NOTE — Assessment & Plan Note (Signed)
She does still require oxygen all the time including rest and night time. Have filled out papers to recertify. No new concerns and she continues to receive her opsumit and monitoring at tertiary care facility.

## 2015-12-24 ENCOUNTER — Telehealth: Payer: Self-pay | Admitting: Internal Medicine

## 2015-12-24 NOTE — Telephone Encounter (Signed)
Stephanie from [employer] called stating she faxed a form for In Office oximetry oxygen testing with O2 order. She received the O2 order but did not receive the form. She can be reached at [phone]

## 2015-12-29 NOTE — Telephone Encounter (Signed)
Judeth CornfieldStephanie called again

## 2015-12-29 NOTE — Telephone Encounter (Signed)
Left message for Stephanie to call back. 

## 2016-01-05 NOTE — Telephone Encounter (Signed)
Spoke with Judeth CornfieldStephanie and faxed the paperwork needed to get the O2 approved.

## 2016-01-05 NOTE — Telephone Encounter (Signed)
She is still needing the In Office Oximetry alone with office notes from 12/28.  She has been trying several times to get ahold of you. Can you please call her asap at [phone]

## 2016-01-12 ENCOUNTER — Telehealth: Payer: Self-pay | Admitting: Internal Medicine

## 2016-01-12 ENCOUNTER — Other Ambulatory Visit: Payer: Self-pay | Admitting: Internal Medicine

## 2016-01-12 NOTE — Telephone Encounter (Signed)
States she needs a medication list to be faxed over to [id number]

## 2016-01-12 NOTE — Telephone Encounter (Signed)
Faxed to number provided

## 2016-01-13 NOTE — Telephone Encounter (Signed)
Sent to phamacy 

## 2016-01-17 ENCOUNTER — Telehealth: Payer: Self-pay | Admitting: *Deleted

## 2016-01-17 MED ORDER — HYDROCODONE-ACETAMINOPHEN 7.5-325 MG PO TABS: 1.0000 | ORAL_TABLET | ORAL | Status: DC | PRN

## 2016-01-17 NOTE — Telephone Encounter (Signed)
Notified [name] rx ready for pick-up.../lmb 

## 2016-01-17 NOTE — Telephone Encounter (Signed)
Left msg on triage requesting refill on pt hydrocodone.../lmb 

## 2016-01-17 NOTE — Telephone Encounter (Signed)
Printed and signed.  

## 2016-02-15 ENCOUNTER — Telehealth: Payer: Self-pay | Admitting: Internal Medicine

## 2016-02-15 MED ORDER — HYDROCODONE-ACETAMINOPHEN 7.5-325 MG PO TABS: 1.0000 | ORAL_TABLET | ORAL | Status: DC | PRN

## 2016-02-15 NOTE — Telephone Encounter (Signed)
Patient aware. Placed in cabinet up front.  

## 2016-02-15 NOTE — Telephone Encounter (Signed)
Patient is requesting a refill of HYDROcodone-acetaminophen (NORCO) 7.5-325 MG tablet [[id number]]

## 2016-02-15 NOTE — Telephone Encounter (Signed)
Printed and signed.  

## 2016-02-23 ENCOUNTER — Encounter (HOSPITAL_COMMUNITY): Payer: Self-pay | Admitting: Cardiology

## 2016-02-23 ENCOUNTER — Emergency Department (HOSPITAL_COMMUNITY)
Admission: EM | Admit: 2016-02-23 | Discharge: 2016-02-23 | Disposition: A | Discharge status: Home / Self Care | Payer: Medicare Other | Attending: Emergency Medicine | Admitting: Emergency Medicine

## 2016-02-23 ENCOUNTER — Emergency Department (HOSPITAL_COMMUNITY): Payer: Medicare Other

## 2016-02-23 DIAGNOSIS — Z87891 Personal history of nicotine dependence: Secondary | ICD-10-CM | POA: Diagnosis not present

## 2016-02-23 DIAGNOSIS — K219 Gastro-esophageal reflux disease without esophagitis: Secondary | ICD-10-CM | POA: Diagnosis not present

## 2016-02-23 DIAGNOSIS — R69 Illness, unspecified: Secondary | ICD-10-CM

## 2016-02-23 DIAGNOSIS — R011 Cardiac murmur, unspecified: Secondary | ICD-10-CM | POA: Insufficient documentation

## 2016-02-23 DIAGNOSIS — R05 Cough: Secondary | ICD-10-CM | POA: Diagnosis not present

## 2016-02-23 DIAGNOSIS — Z9981 Dependence on supplemental oxygen: Secondary | ICD-10-CM | POA: Diagnosis not present

## 2016-02-23 DIAGNOSIS — Z79899 Other long term (current) drug therapy: Secondary | ICD-10-CM | POA: Diagnosis not present

## 2016-02-23 DIAGNOSIS — I509 Heart failure, unspecified: Secondary | ICD-10-CM | POA: Insufficient documentation

## 2016-02-23 DIAGNOSIS — J45901 Unspecified asthma with (acute) exacerbation: Secondary | ICD-10-CM | POA: Insufficient documentation

## 2016-02-23 DIAGNOSIS — J209 Acute bronchitis, unspecified: Secondary | ICD-10-CM | POA: Diagnosis not present

## 2016-02-23 DIAGNOSIS — F419 Anxiety disorder, unspecified: Secondary | ICD-10-CM | POA: Insufficient documentation

## 2016-02-23 DIAGNOSIS — E669 Obesity, unspecified: Secondary | ICD-10-CM | POA: Diagnosis not present

## 2016-02-23 LAB — COMPREHENSIVE METABOLIC PANEL
ALBUMIN: 3.5 g/dL (ref 3.5–5.0)
ALK PHOS: 98 U/L (ref 38–126)
ALT: 129 U/L — AB (ref 14–54)
AST: 78 U/L — AB (ref 15–41)
Anion gap: 13 (ref 5–15)
BUN: 16 mg/dL (ref 6–20)
CALCIUM: 9.3 mg/dL (ref 8.9–10.3)
CHLORIDE: 104 mmol/L (ref 101–111)
CO2: 22 mmol/L (ref 22–32)
CREATININE: 0.77 mg/dL (ref 0.44–1.00)
GFR calc non Af Amer: >60 mL/min (ref >60)
GLUCOSE: 147 mg/dL — AB (ref 65–99)
Potassium: 4.5 mmol/L (ref 3.5–5.1)
SODIUM: 139 mmol/L (ref 135–145)
Total Bilirubin: 0.7 mg/dL (ref 0.3–1.2)
Total Protein: 7 g/dL (ref 6.5–8.1)

## 2016-02-23 LAB — URINALYSIS, ROUTINE W REFLEX MICROSCOPIC
Glucose, UA: NEGATIVE mg/dL
Hgb urine dipstick: NEGATIVE
KETONES UR: NEGATIVE mg/dL
NITRITE: NEGATIVE
PH: 5.5 (ref 5.0–8.0)
Protein, ur: 100 mg/dL — AB
Specific Gravity, Urine: 1.023 (ref 1.005–1.030)

## 2016-02-23 LAB — CBC WITH DIFFERENTIAL/PLATELET
BASOS ABS: 0.1 10*3/uL (ref 0.0–0.1)
BASOS PCT: 1 %
EOS ABS: 0.2 10*3/uL (ref 0.0–0.7)
Eosinophils Relative: 2 %
HCT: 35.8 % — ABNORMAL LOW (ref 36.0–46.0)
HEMOGLOBIN: 11.4 g/dL — AB (ref 12.0–15.0)
Lymphocytes Relative: 13 %
Lymphs Abs: 1.3 10*3/uL (ref 0.7–4.0)
MCH: 28.2 pg (ref 26.0–34.0)
MCHC: 31.8 g/dL (ref 30.0–36.0)
MCV: 88.6 fL (ref 78.0–100.0)
MONOS PCT: 6 %
Monocytes Absolute: 0.6 10*3/uL (ref 0.1–1.0)
NEUTROS ABS: 7.9 10*3/uL — AB (ref 1.7–7.7)
NEUTROS PCT: 79 %
PLATELETS: 285 10*3/uL (ref 150–400)
RBC: 4.04 MIL/uL (ref 3.87–5.11)
RDW: 15.1 % (ref 11.5–15.5)
WBC: 10 10*3/uL (ref 4.0–10.5)

## 2016-02-23 LAB — BRAIN NATRIURETIC PEPTIDE: B NATRIURETIC PEPTIDE 5: 324.9 pg/mL — AB (ref 0.0–100.0)

## 2016-02-23 LAB — I-STAT ARTERIAL BLOOD GAS, ED
Acid-Base Excess: 1 mmol/L (ref 0.0–2.0)
BICARBONATE: 27.5 meq/L — AB (ref 20.0–24.0)
O2 Saturation: 97 %
PCO2 ART: 50.6 mmHg — AB (ref 35.0–45.0)
TCO2: 29 mmol/L (ref 0–100)
pH, Arterial: 7.343 — ABNORMAL LOW (ref 7.350–7.450)
pO2, Arterial: 95 mmHg (ref 80.0–100.0)

## 2016-02-23 LAB — URINE MICROSCOPIC-ADD ON

## 2016-02-23 LAB — I-STAT CG4 LACTIC ACID, ED
Lactic Acid, Venous: 1.01 mmol/L (ref 0.5–2.0)
Lactic Acid, Venous: 1.67 mmol/L (ref 0.5–2.0)

## 2016-02-23 LAB — TROPONIN I: Troponin I: <0.03 ng/mL (ref <0.031)

## 2016-02-23 MED ORDER — IPRATROPIUM-ALBUTEROL 0.5-2.5 (3) MG/3ML IN SOLN
3.0000 mL | Freq: Once | RESPIRATORY_TRACT | Status: AC
  Administered 2016-02-23: 3 mL via RESPIRATORY_TRACT
  Filled 2016-02-23: qty 3

## 2016-02-23 MED ORDER — HYDROCODONE-ACETAMINOPHEN 5-325 MG PO TABS
2.0000 | ORAL_TABLET | Freq: Once | ORAL | Status: AC
Start: 2016-02-23 — End: 2016-02-23
  Administered 2016-02-23: 2 via ORAL
  Filled 2016-02-23: qty 2

## 2016-02-23 MED ORDER — AEROCHAMBER PLUS W/MASK MISC: Status: DC

## 2016-02-23 MED ORDER — ALBUTEROL SULFATE HFA 108 (90 BASE) MCG/ACT IN AERS: 2.0000 | INHALATION_SPRAY | RESPIRATORY_TRACT | Status: DC | PRN

## 2016-02-23 NOTE — ED Notes (Signed)
Pt reports a cough, fever and congestion for the past couple of days. States that she is on home O2 all the time and has increased the amount from 2L to 5L.

## 2016-02-23 NOTE — Discharge Instructions (Signed)
Acute Bronchitis Bronchitis is inflammation of the airways that extend from the windpipe into the lungs (bronchi). The inflammation often causes mucus to develop. This leads to a cough, which is the most common symptom of bronchitis.  In acute bronchitis, the condition usually develops suddenly and goes away over time, usually in a couple weeks. Smoking, allergies, and asthma can make bronchitis worse. Repeated episodes of bronchitis may cause further lung problems.  CAUSES Acute bronchitis is most often caused by the same virus that causes a cold. The virus can spread from person to person (contagious) through coughing, sneezing, and touching contaminated objects. SIGNS AND SYMPTOMS   Cough.   Fever.   Coughing up mucus.   Body aches.   Chest congestion.   Chills.   Shortness of breath.   Sore throat.  DIAGNOSIS  Acute bronchitis is usually diagnosed through a physical exam. Your health care provider will also ask you questions about your medical history. Tests, such as chest X-rays, are sometimes done to rule out other conditions.  TREATMENT  Acute bronchitis usually goes away in a couple weeks. Oftentimes, no medical treatment is necessary. Medicines are sometimes given for relief of fever or cough. Antibiotic medicines are usually not needed but may be prescribed in certain situations. In some cases, an inhaler may be recommended to help reduce shortness of breath and control the cough. A cool mist vaporizer may also be used to help thin bronchial secretions and make it easier to clear the chest.  HOME CARE INSTRUCTIONS  Get plenty of rest.   Drink enough fluids to keep your urine clear or pale yellow (unless you have a medical condition that requires fluid restriction). Increasing fluids may help thin your respiratory secretions (sputum) and reduce chest congestion, and it will prevent dehydration.   Take medicines only as directed by your health care provider.  If  you were prescribed an antibiotic medicine, finish it all even if you start to feel better.  Avoid smoking and secondhand smoke. Exposure to cigarette smoke or irritating chemicals will make bronchitis worse. If you are a smoker, consider using nicotine gum or skin patches to help control withdrawal symptoms. Quitting smoking will help your lungs heal faster.   Reduce the chances of another bout of acute bronchitis by washing your hands frequently, avoiding people with cold symptoms, and trying not to touch your hands to your mouth, nose, or eyes.   Keep all follow-up visits as directed by your health care provider.  SEEK MEDICAL CARE IF: Your symptoms do not improve after 1 week of treatment.  SEEK IMMEDIATE MEDICAL CARE IF:  You develop an increased fever or chills.   You have chest pain.   You have severe shortness of breath.  You have bloody sputum.   You develop dehydration.  You faint or repeatedly feel like you are going to pass out.  You develop repeated vomiting.  You develop a severe headache. MAKE SURE YOU:   Understand these instructions.  Will watch your condition.  Will get help right away if you are not doing well or get worse.   This information is not intended to replace advice given to you by your health care provider. Make sure you discuss any questions you have with your health care provider.   Document Released: 01/18/2005 Document Revised: 01/01/2015 Document Reviewed: 06/03/2013 Elsevier Interactive Patient Education 2016 [employer] Pulmonary Hypertension Pulmonary hypertension is high blood pressure within the arteries in your lungs (pulmonary arteries). It is different  than having high blood pressure elsewhere in your body, such as blood pressure that is measured with a blood pressure cuff. Pulmonary hypertension makes it harder for blood to flow through the lungs. As a result, the heart must work harder to pump blood through the lungs, and it  may be harder for you to breathe. Over time, this can weaken the heart muscle. Pulmonary hypertension is a serious condition and it can be fatal.  CAUSES Many different medical conditions can cause pulmonary hypertension. Pulmonary hypertension can be categorized by cause into five groups: Group 1 Pulmonary hypertension that is caused by abnormal growth of small blood vessels in the lungs (pulmonary arterial hypertension). The abnormal blood vessel growth may have no known cause, or it may be:  Passed along from a parent (hereditary).  Caused by another disease, such as a connective tissue disease (including lupus or scleroderma) or HIV.  Caused by certain drugs or toxins. Group 2 Pulmonary hypertension that is caused by weakness of the main chamber of the heart (left ventricle) or heart valve disease. Group 3 Pulmonary hypertension that is caused by lung disease or low oxygen levels. Causes in this group include:  Emphysema or chronic obstructive pulmonary disease (COPD).  Untreated sleep apnea.  Pulmonary fibrosis. Group 4 Pulmonary hypertension that is caused by blood clots in the lungs (pulmonary emboli).  Group 5 Other causes of pulmonary hypertension, such as sickle cell anemia, or a mix of multiple causes. SYMPTOMS Symptoms of this condition include:  Shortness of breath. You may notice shortness of breath with:  Activity, such as walking.  No activity.  Tiredness and fatigue.  Dizziness or fainting.  Rapid heartbeat or feeling your heart flutter or skip a beat (palpitations).  Neck vein enlargement.  Bluish color to your lips and fingertips. DIAGNOSIS This condition may be diagnosed by:  Chest X-ray.  Arterial blood gases. This test checks the acidity of your blood as well as your blood oxygen and carbon dioxide levels.  CT scan. This test can provide detailed images of your lungs.  Pulmonary function test. This test measures how much air your lungs can  hold. It also tests how well air moves in and out of your lungs.  Electrocardiogram (ECG). This test traces the electrical activity of your heart.  Echocardiogram. This test is used to look at your heart in motion and check how it is functioning.  Heart catheterization. This test can measure the pressure in your pulmonary artery and the right side of your heart.  Lung biopsy. This procedure involves checking a sample of lung tissue to find underlying causes. TREATMENT There is no cure for pulmonary hypertension, but treatment can help to relieve symptoms and slow the progress of the condition. Treatment can involve:  Medicines, such as:  Blood pressure medicines.  Medicines to relax (dilate) the pulmonary blood vessels.  Water pills to get rid of extra fluid (diuretic medicines).  Blood-thinning medicines.  Surgery. For severe pulmonary hypertension that does not respond to medical treatment, heart-lung or lung transplant may be needed. HOME CARE INSTRUCTIONS  Take medicines only as directed by your health care provider. These include over-the-counter medicines and prescription medicines. Take all medicines exactly as instructed. Do not change or stop medicines without first checking with your health care provider.  Do not smoke. If you need help quitting, ask your health care provider.  Eat a healthy diet.  Limit your salt (sodium) intake to less than 2,300 mg per day.  Stay as  active as possible. Exercise as directed by your health care provider. Talk with your health care provider about what type of exercise is safe for you.  Avoid high altitudes.  Avoid hot tubs and saunas.  Avoid becoming pregnant, if this applies. Talk with your health care provider about safe methods of birth control.  Keep all follow-up visits as directed by your health care provider. This is important. SEEK IMMEDIATE MEDICAL CARE IF:  You have severe shortness of breath.  You develop chest pain  or pressure in your chest.  You cough up blood.  You develop swelling of your feet or legs.  You have a significant increase in weight within 1-2 days.   This information is not intended to replace advice given to you by your health care provider. Make sure you discuss any questions you have with your health care provider.   Document Released: 10/08/2007 Document Revised: 04/27/2015 Document Reviewed: 06/02/2013 Elsevier Interactive Patient Education [employer]

## 2016-02-23 NOTE — ED Provider Notes (Signed)
CSN: [id number]     Arrival date & time 02/23/16  1329 History   First MD Initiated Contact with Patient 02/23/16 1540     Chief Complaint  Patient presents with  . Cough  . Fever     (Consider location/radiation/quality/duration/timing/severity/associated sxs/prior Treatment) HPI Patient reports 2-3 days of cough and chest congestion. She reports occasional production of a small amount of mucus. She reports increased shortness of breath. At baseline she uses 2 L of nasal cannula oxygen chronically. She states now, she has needed to use 4 L at rest and up to 5 L with activity. She denies chest pain. Although fever is documented in triage note, patient denies fever. She denies pain or swelling in the legs. She denies increased dyspnea with lying flat. No associated abdominal pain, nausea vomiting or diarrhea. The patient has pulmonary hypertension. She reports this was diagnosed approximately 5 years ago. She had tetralogy of Fallot repair at 30 years of age. She reports that up until about 5 years ago she had been doing well. It was 5 years ago that she got a diagnosis of pulmonary hypertension and congestive heart failure. Patient is an ex-smoker from approximately 5 years ago.  Patient is seen at [facility] by a pulmonologist Dr. Monia Pouch. She reports last appointment was in September. Past Medical History  Diagnosis Date  . Back pain   . GERD (gastroesophageal reflux disease)   . Asthma     As a child  . Scoliosis deformity of spine   . Anxiety   . Tetralogy of Fallot   . Pulmonary hypertension (HCC)   . Heart failure [facility])    Past Surgical History  Procedure Laterality Date  . Tetralogy of fallot repair      Age 30 at [facility]  . Blalock procedure      3 months  . Back surgery      Rods for scoliosis   Family History  Problem Relation Age of Onset  . Diabetes Mother   . Hypertension Mother   . Cancer Mother     uterine cancer  . Alcohol abuse Father   . Cancer Maternal Aunt     Breast  Cancer, Lung cancer   Social History  Substance Use Topics  . Smoking status: Former Smoker -- 1.50 packs/day for 10 years    Types: Cigarettes    Quit date: 02/02/2011  . Smokeless tobacco: Never Used  . Alcohol Use: No   OB History    No data available     Review of Systems 10 Systems reviewed and are negative for acute change except as noted in the HPI.    Allergies  Avelox  Home Medications   Prior to Admission medications   Medication Sig Start Date End Date Taking? Authorizing Provider  ALPRAZolam (XANAX) 0.25 MG tablet TAKE 1 TO 2 TABLETS EVERY DAY AS NEEDED Patient taking differently: TAKE 1 TO 2 TABLETS EVERY DAY AS NEEDED FOR ANXIETY 01/13/16  Yes Myrlene Broker, MD  furosemide (LASIX) 20 MG tablet Take 40 mg by mouth daily. Take 40 mg by mouth daily.   Yes Historical Provider, MD  HYDROcodone-acetaminophen (NORCO) 7.5-325 MG tablet Take 1 tablet by mouth every 4 (four) hours as needed. Patient taking differently: Take 1 tablet by mouth every 4 (four) hours as needed for moderate pain.  02/15/16  Yes Myrlene Broker, MD  Macitentan (OPSUMIT) 10 MG TABS Take 10 mg by mouth daily.   Yes Historical Provider, MD  norgestrel-ethinyl  estradiol (CRYSELLE-28) 0.3-30 MG-MCG tablet Take 1 tablet by mouth daily. 11/12/15  Yes Myrlene Broker, MD  OXYGEN Inhale 3-6 L into the lungs continuous. Uses 3L at rest, up to 6L upon exertion   Yes Historical Provider, MD  pantoprazole (PROTONIX) 40 MG tablet TAKE 1 TABLET (40 MG TOTAL) BY MOUTH DAILY. 12/23/15  Yes Myrlene Broker, MD  potassium chloride (K-DUR) 10 MEQ tablet Take 1 tablet (10 mEq total) by mouth daily. 07/08/15  Yes Myrlene Broker, MD  PROAIR HFA 108 [id number] BASE) MCG/ACT inhaler INHALE 2 PUFFS INTO THE LUNGS EVERY 6 (SIX) HOURS AS NEEDED FOR WHEEZING OR SHORTNESS OF BREATH. 07/07/15  Yes Myrlene Broker, MD  sertraline (ZOLOFT) 100 MG tablet TAKE 2 TABLETS (200 MG TOTAL) BY MOUTH DAILY. 01/13/16   Yes Myrlene Broker, MD  albuterol (PROVENTIL HFA;VENTOLIN HFA) 108 (90 Base) MCG/ACT inhaler Inhale 2 puffs into the lungs every 2 (two) hours as needed for wheezing or shortness of breath (cough). 02/23/16   Arby Barrette, MD  amoxicillin (AMOXIL) 500 MG capsule Take 1 capsule (500 mg total) by mouth 3 (three) times daily. Patient not taking: Reported on 12/22/2015 05/04/15   Myrlene Broker, MD  diclofenac sodium (VOLTAREN) 1 % GEL Apply 2 g topically 3 (three) times daily as needed. Patient not taking: Reported on 12/22/2015 10/02/14   Myrlene Broker, MD  Spacer/Aero-Holding Chambers (AEROCHAMBER PLUS FLO-VU MEDIUM) MISC 1 each by Other route once. Patient not taking: Reported on 12/22/2015 11/28/13   Jacques Navy, MD  Spacer/Aero-Holding Chambers (AEROCHAMBER PLUS WITH MASK) inhaler Use as instructed 02/23/16   Arby Barrette, MD   BP 135/47 mmHg  Pulse 72  Temp(Src) 98 F (36.7 C) (Oral)  Resp 24  SpO2 99%  LMP 02/15/2016 Physical Exam  Constitutional: She is oriented to person, place, and time.  Patient has central obesity. She is alert and nontoxic. Very mild increased work of breathing at rest.  HENT:  Head: Normocephalic and atraumatic.  Nose: Nose normal.  Mouth/Throat: Oropharynx is clear and moist.  Eyes: EOM are normal. Pupils are equal, round, and reactive to light.  Neck: Neck supple.  Cardiovascular: Normal rate.   Patient has approximately 1-2 systolic ejection murmur. There appears to be a split S1. Radial pulses are intact.  Pulmonary/Chest:  Mild increased work of breathing. Soft breath sounds at bases. No gross wheeze rhonchi or rail.  Abdominal: Soft. She exhibits no distension. There is no tenderness.  Musculoskeletal: She exhibits no edema or tenderness.  Neurological: She is alert and oriented to person, place, and time. She exhibits normal muscle tone. Coordination normal.  Skin: Skin is warm and dry.  Psychiatric: She has a normal mood and  affect.    ED Course  Procedures (including critical care time) Labs Review Labs Reviewed  URINALYSIS, ROUTINE W REFLEX MICROSCOPIC (NOT AT [facility]) - Abnormal; Notable for the following:    Color, Urine AMBER (*)    Bilirubin Urine SMALL (*)    Protein, ur 100 (*)    Leukocytes, UA MODERATE (*)    All other components within normal limits  CBC WITH DIFFERENTIAL/PLATELET - Abnormal; Notable for the following:    Hemoglobin 11.4 (*)    HCT 35.8 (*)    Neutro Abs 7.9 (*)    All other components within normal limits  COMPREHENSIVE METABOLIC PANEL - Abnormal; Notable for the following:    Glucose, Bld 147 (*)    AST 78 (*)  ALT 129 (*)    All other components within normal limits  BRAIN NATRIURETIC PEPTIDE - Abnormal; Notable for the following:    B Natriuretic Peptide 324.9 (*)    All other components within normal limits  URINE MICROSCOPIC-ADD ON - Abnormal; Notable for the following:    Squamous Epithelial / LPF 6-30 (*)    Bacteria, UA RARE (*)    Casts HYALINE CASTS (*)    All other components within normal limits  I-STAT ARTERIAL BLOOD GAS, ED - Abnormal; Notable for the following:    pH, Arterial 7.343 (*)    pCO2 arterial 50.6 (*)    Bicarbonate 27.5 (*)    All other components within normal limits  URINE CULTURE  TROPONIN I  BLOOD GAS, ARTERIAL  I-STAT CG4 LACTIC ACID, ED  I-STAT CG4 LACTIC ACID, ED    Imaging Review Dg Chest 2 View  02/23/2016  CLINICAL DATA:  Cough and congestion for 4 days. EXAM: CHEST  2 VIEW COMPARISON:  01/08/2013. FINDINGS: The heart is enlarged but stable. 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 Moderate eventration of the left hemidiaphragm. Chronic lung changes with pulmonary scarring. No pleural effusion or focal infiltrates. IMPRESSION: 1. Stable cardiac enlargement and prominent pulmonary vasculature. 2. Pulmonary scarring changes but no acute overlying pulmonary process. Electronically Signed   By: Rudie Meyer M.D.   On: 02/23/2016 14:08   I have personally reviewed and evaluated these images and lab results as part of my medical decision-making.   EKG Interpretation None     Consult: Patient's case was reviewed with Dr. Corliss Blacker. We reviewed her medical illness with pulmonary hypertension and history of congenital heart disease. At this time, based on diagnostic results and patient presentation, he felt it was appropriate to manage as an acute bronchitis. Okay to administer DuoNeb's. MDM   Final diagnoses:  Acute bronchitis, unspecified organism  Severe comorbid illness   Patient presents with symptoms that are consistent with acute bronchitis. He has had nasal congestion and cough. She denies fever although it is listed in the triage intake. She does not have chest pain. She was kept on 2 L nasal cannula which is her baseline home oxygen. Oxygen saturations stayed at 95-100%. Patient felt subjective improvement after DuoNeb therapy. Repeat auscultation showed improvement air flow. Gross wheezing. His condition remained stable with stable vital signs and clear mental status and no increased work of breathing. At this time she will be given a spacer to use with albuterol for better delivery. She is instructed to follow-up with her pulmonologist within the next 2-4 days. Return precautions are reviewed.    Arby Barrette, MD 02/23/16 2031

## 2016-02-24 ENCOUNTER — Telehealth: Payer: Self-pay | Admitting: Internal Medicine

## 2016-02-24 LAB — URINE CULTURE

## 2016-02-24 NOTE — Telephone Encounter (Signed)
Pt's sgo, [name] called and pt went to ER and she has bronchitis. They did not send her home with any cough medicine. He feels with her health issues she shouldn't be straining by coughing. They are wondering if you would be able to call any cough medicine in.   Pharmacy is [facility] on [address]

## 2016-02-25 ENCOUNTER — Encounter: Payer: Self-pay | Admitting: *Deleted

## 2016-02-25 ENCOUNTER — Other Ambulatory Visit: Payer: Self-pay | Admitting: Internal Medicine

## 2016-02-25 MED ORDER — HYDROCODONE-HOMATROPINE 5-1.5 MG/5ML PO SYRP: 5.0000 mL | ORAL_SOLUTION | Freq: Three times a day (TID) | ORAL | Status: DC | PRN

## 2016-02-25 NOTE — Telephone Encounter (Signed)
Printed and signed, they can pick up.  

## 2016-02-25 NOTE — Telephone Encounter (Addendum)
Called pt/ Maria CowerJason no answer can't leave msg due to vm being full.sent pt/jason a mychart msg stating the rx ready for pick-up.../lmb.Marland Kitchen.Raechel Chute/lmb

## 2016-03-10 ENCOUNTER — Encounter: Payer: Self-pay | Admitting: Internal Medicine

## 2016-03-10 ENCOUNTER — Ambulatory Visit (INDEPENDENT_AMBULATORY_CARE_PROVIDER_SITE_OTHER): Payer: Medicare Other | Admitting: Internal Medicine

## 2016-03-10 VITALS — BP 148/62 | HR 92 | Temp 97.9°F | Resp 16 | Ht 60.0 in | Wt 190.0 lb

## 2016-03-10 DIAGNOSIS — G894 Chronic pain syndrome: Secondary | ICD-10-CM | POA: Diagnosis not present

## 2016-03-10 DIAGNOSIS — I2729 Other secondary pulmonary hypertension: Secondary | ICD-10-CM

## 2016-03-10 DIAGNOSIS — Z9889 Other specified postprocedural states: Secondary | ICD-10-CM | POA: Diagnosis not present

## 2016-03-10 DIAGNOSIS — Z8774 Personal history of (corrected) congenital malformations of heart and circulatory system: Secondary | ICD-10-CM | POA: Diagnosis not present

## 2016-03-10 DIAGNOSIS — I272 Other secondary pulmonary hypertension: Secondary | ICD-10-CM | POA: Diagnosis not present

## 2016-03-10 DIAGNOSIS — I50810 Right heart failure, unspecified: Secondary | ICD-10-CM

## 2016-03-10 MED ORDER — HYDROCODONE-ACETAMINOPHEN 10-325 MG PO TABS: 1.0000 | ORAL_TABLET | Freq: Three times a day (TID) | ORAL | Status: DC | PRN

## 2016-03-10 MED ORDER — PANTOPRAZOLE SODIUM 40 MG PO TBEC: DELAYED_RELEASE_TABLET | ORAL | Status: DC

## 2016-03-10 NOTE — Progress Notes (Signed)
   Subjective:    Patient ID: [name], female    DOB: August 26, 1986, 30 y.o.   MRN: [id number]  HPI The patient is a 30 YO female coming in for follow up of ER visit (in for bronchitis and given inhaler). 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 She is feeling better today. We gave them some cough syrup to help with coughing which she did not pick up until today. She is down on her oxygen at home and not quite back to her baseline. She called her lung doctor but they were not able to see her until the summer.   [facility], [facility], social history reviewed and updated.   Review of Systems  Constitutional: Negative for fever, activity change, appetite change and fatigue.  HENT: Positive for congestion.   Respiratory: Positive for shortness of breath. Negative for chest tightness and wheezing.   Gastrointestinal: Negative for abdominal pain, diarrhea, constipation and abdominal distention.  Genitourinary: Negative.   Musculoskeletal: Positive for back pain and arthralgias. Negative for gait problem.  Neurological: Negative for dizziness, weakness, light-headedness and headaches.  Psychiatric/Behavioral: Negative for suicidal ideas and self-injury.      Objective:   Physical Exam  Constitutional: She is oriented to person, place, and time. She appears well-developed.  Overweight.  HENT:  Head: Normocephalic and atraumatic.  Smelling of cats  Eyes: EOM are normal.  Neck: Normal range of motion.  Cardiovascular: Normal rate.   Murmurs noted.   Pulmonary/Chest: Effort normal and breath sounds normal.  Breathing about at baseline, Oxygen above normal at 3L today  Abdominal: Soft. Bowel sounds are normal.  Neurological: She is alert and oriented to person, place, and time. Coordination normal.  Skin: Skin is warm and dry.   Filed Vitals:   03/10/16 1305  BP: 148/62  Pulse: 92  Temp: 97.9 F (36.6 C)  TempSrc: Oral  Resp: 16  Height: 5' (1.524 m)    Weight: 190 lb (86.183 kg)  SpO2: 92%      Assessment & Plan:

## 2016-03-10 NOTE — Progress Notes (Signed)
Pre visit review using our clinic review tool, if applicable. No additional management support is needed unless otherwise documented below in the visit note. 

## 2016-03-10 NOTE — Assessment & Plan Note (Signed)
She is euvolemic on exam today. Will request records from her heart and lung doctor since they do not send us any records. Breathing is close to baseline today.

## 2016-03-10 NOTE — Patient Instructions (Signed)
We have increased the pain medicine to see if this helps more with pain.   We will get the records from the lung and heart doctor since they are not good about sending us records.

## 2016-03-10 NOTE — Assessment & Plan Note (Signed)
[facility]ology every 6 months. She does not have a local heart or lung doctor. Her breathing is very close to baseline today.

## 2016-03-10 NOTE — Assessment & Plan Note (Signed)
Will increase medicine dose to 10/325 mg hydrocodone. Related to her severe scoliosis and several surgeries on her back.

## 2016-04-04 ENCOUNTER — Telehealth: Payer: Self-pay | Admitting: Internal Medicine

## 2016-04-04 NOTE — Telephone Encounter (Signed)
TELEPHONE ADVICE RECORD [facility]  Patient Name: [name]  DOB: 09/25/1986    Initial Comment Blood sugar 198, no symptoms   Nurse Assessment  Nurse: Maria LusterBowers, RN, [name] Date/Time [name](Eastern Time): 04/04/2016 3:42:08 PM  Confirm and document reason for call. If symptomatic, describe symptoms. You must click the next button to save text entered. ---Blood sugar 198, no symptoms. Caller reports that the patient is not diabetic, but the caller is. Last night the patient's blood sugar was 300 and is 198 this afternoon before eating breakfast.  Has the patient traveled out of the country within the last 30 days? ---No  Does the patient have any new or worsening symptoms? ---Yes  Will a triage be completed? ---Yes  Related visit to physician within the last 2 weeks? ---Yes  Does the PT have any chronic conditions? (i.e. diabetes, asthma, etc.) ---Yes  List chronic conditions. ---pulmonary HTN, wears O2, CHF  Is the patient pregnant or possibly pregnant? (Ask all females between the ages of 3712-55) ---No  Is this a behavioral health or substance abuse call? ---No     Guidelines    Guideline Title Affirmed Question Affirmed Notes  Diabetes - High Blood Sugar [1] Symptoms of high blood sugar (e.g., frequent urination, weak, weight loss) AND [2] not able to test blood glucose    Final Disposition User   See Physician within 24 Hours Maria LusterBowers, RN, [name]    Referrals  REFERRED TO PCP OFFICE   Disagree/Comply: [name]

## 2016-04-04 NOTE — Telephone Encounter (Signed)
Pt's friend called in to schedule an appt for pt. Pt checked her blood sugar on friends diabetic meter and it was elevated. Pt didn't want to schedule until a few weeks out. Scheduled appt. Transferred pt to [facility] to speak with a nurse.

## 2016-04-04 NOTE — Telephone Encounter (Signed)
Pt is requesting a refill on HYDROcodone-acetaminophen  Rx.    CB: [phone]

## 2016-04-05 ENCOUNTER — Other Ambulatory Visit (INDEPENDENT_AMBULATORY_CARE_PROVIDER_SITE_OTHER): Payer: Medicare Other

## 2016-04-05 ENCOUNTER — Ambulatory Visit (INDEPENDENT_AMBULATORY_CARE_PROVIDER_SITE_OTHER): Payer: Medicare Other | Admitting: Nurse Practitioner

## 2016-04-05 VITALS — BP 110/70 | HR 91 | Temp 97.6°F | Ht 60.0 in | Wt 197.8 lb

## 2016-04-05 DIAGNOSIS — R631 Polydipsia: Secondary | ICD-10-CM | POA: Diagnosis not present

## 2016-04-05 DIAGNOSIS — R7309 Other abnormal glucose: Secondary | ICD-10-CM

## 2016-04-05 LAB — HEMOGLOBIN A1C: HEMOGLOBIN A1C: 7.3 % — AB (ref 4.6–6.5)

## 2016-04-05 MED ORDER — HYDROCODONE-ACETAMINOPHEN 10-325 MG PO TABS: 1.0000 | ORAL_TABLET | Freq: Three times a day (TID) | ORAL | Status: DC | PRN

## 2016-04-05 NOTE — Progress Notes (Signed)
Pre visit review using our clinic review tool, if applicable. No additional management support is needed unless otherwise documented below in the visit note. 

## 2016-04-05 NOTE — Telephone Encounter (Signed)
Printed and signed.  

## 2016-04-05 NOTE — Patient Instructions (Signed)
Hemoglobin A1c Test Some of the sugar (glucose) that circulates in your blood sticks or binds to blood proteins. Hemoglobin (Hb or Hgb) is one type of blood protein that glucose binds to. It also carries oxygen in the red blood cells (RBCs). When glucose binds to Hb, the glucose-coated Hb is called glycated Hb. Once Hb is glycated, it remains that way for the life of the RBC. This is about 120 days. Rather than testing your blood glucose level on one single day, the hemoglobin A1c (HbA1c) test measures the average amount of glycated hemoglobin and, therefore, the average amount of glucose in your blood during the 3-4 months just before the test is done. The HbA1c test is used to monitor long-term control of blood sugar in people who have diabetes mellitus. The HbA1c test can also be used in addition to or in combination with fasting blood glucose level and oral glucose tolerance tests. RESULTS It is your responsibility to obtain your test results. Ask the lab or department performing the test when and how you will get your results. Contact your health care provider to discuss any questions you have about your results. Range of Normal Values Ranges for normal values may vary among different labs and hospitals. You should always check with your health care provider after having lab work or other tests done to discuss the meaning of your test results and whether your values are considered within normal limits. The ranges for normal HbA1c test results are as follows:  Adult or child without diabetes: 4-5.9%.  Adult or child with diabetes and good blood glucose control: less than 6.5%. Several factors can affect HbA1c test results. These may include:  Diseases (hemoglobinopathies) that cause a change in the shape, size, or amount of Hb in your blood.  Longer than normal RBC life span.  Abnormally low levels of certain proteins in your blood.  Eating foods or taking supplements that are high in vitamin  C (ascorbic acid). Meaning of Results Outside Normal Value Ranges Abnormally high HbA1c values are most commonly an indication of prediabetes mellitus and diabetes mellitus:  An HbA1c result of 5.7-6.4% is considered diagnostic of prediabetes mellitus.  An HbA1c result of 6.5% or higher on two separate occasions is considered diagnostic of diabetes mellitus. Abnormally low HbA1c values can be caused by several health conditions. These may include:  Pregnancy.  A large amount of blood loss.  Blood transfusions.  Low red blood cell count (anemia). This is caused by premature destruction of red blood cells.  Long-term kidney failure.  Some unusual forms of Hb (Hb variants), such as sickle cell trait. Discuss your test results with your health care provider. He or she will use the results to make a diagnosis and determine a treatment plan that is right for you.   This information is not intended to replace advice given to you by your health care provider. Make sure you discuss any questions you have with your health care provider.   Document Released: 01/02/2005 Document Revised: 01/01/2015 Document Reviewed: 04/27/2014 Elsevier Interactive Patient Education 2016 Elsevier Inc.  

## 2016-04-05 NOTE — Telephone Encounter (Signed)
Patient aware. Placed in cabinet up front.  

## 2016-04-05 NOTE — Progress Notes (Signed)
Patient ID: ORA BOLLIG, female    DOB: [birth year]  Age: 30 y.o. MRN: [id number]  CC: Blood Sugar Problem   HPI [name] presents for CC of BS elevated. Father is accompanying her today.   1) Repeat A1c today  Last A1c 05/03/15 6.3%   Checked with husband's meter 387 2 nights ago a few hours after dinner   Diet- No changes, diet soft drinks, water  Exercise- Unable to get exercise at this time  Water intake- 1- 1.5 liters daily   History [name] has a past medical history of Back pain; GERD (gastroesophageal reflux disease); Asthma; Scoliosis deformity of spine; Anxiety; Tetralogy of Fallot; Pulmonary hypertension (HCC); and Heart failure (HCC).   She has past surgical history that includes Tetralogy of Fallot repair; Blalock procedure; and Back surgery.   Her family history includes Alcohol abuse in her father; Cancer in her maternal aunt and mother; Diabetes in her mother; Hypertension in her mother.She reports that she quit smoking about 5 years ago. Her smoking use included Cigarettes. She has a 15 pack-year smoking history. She has never used smokeless tobacco. She reports that she does not drink alcohol or use illicit drugs.  Outpatient Prescriptions Prior to Visit  Medication Sig Dispense Refill  . albuterol (PROVENTIL HFA;VENTOLIN HFA) 108 (90 Base) MCG/ACT inhaler Inhale 2 puffs into the lungs every 2 (two) hours as needed for wheezing or shortness of breath (cough). 1 Inhaler 0  . ALPRAZolam (XANAX) 0.25 MG tablet TAKE 1 TO 2 TABLETS EVERY DAY AS NEEDED (Patient taking differently: TAKE 1 TO 2 TABLETS EVERY DAY AS NEEDED FOR ANXIETY) 60 tablet 3  . diclofenac sodium (VOLTAREN) 1 % GEL Apply 2 g topically 3 (three) times daily as needed. 100 g 3  . furosemide (LASIX) 20 MG tablet Take 40 mg by mouth daily. Take 40 mg by mouth daily.    Marland Kitchen HYDROcodone-acetaminophen (NORCO) 10-325 MG tablet Take 1 tablet by mouth every 8 (eight) hours as needed. 180 tablet 0  .  Macitentan (OPSUMIT) 10 MG TABS Take 10 mg by mouth daily.    . norgestrel-ethinyl estradiol (CRYSELLE-28) 0.3-30 MG-MCG tablet Take 1 tablet by mouth daily. 28 tablet 7  . OXYGEN Inhale 3-6 L into the lungs continuous. Uses 3L at rest, up to 6L upon exertion    . pantoprazole (PROTONIX) 40 MG tablet TAKE 1 TABLET (40 MG TOTAL) BY MOUTH DAILY. 90 tablet 3  . potassium chloride (K-DUR) 10 MEQ tablet Take 1 tablet (10 mEq total) by mouth daily. 30 tablet 5  . PROAIR HFA 108 (90 Base) MCG/ACT inhaler INHALE 2 PUFFS INTO THE LUNGS EVERY 6 (SIX) HOURS AS NEEDED FOR WHEEZING OR SHORTNESS OF BREATH. 8 Inhaler 3  . sertraline (ZOLOFT) 100 MG tablet TAKE 2 TABLETS (200 MG TOTAL) BY MOUTH DAILY. 180 tablet 3  . amoxicillin (AMOXIL) 500 MG capsule Take 1 capsule (500 mg total) by mouth 3 (three) times daily. 30 capsule 0  . HYDROcodone-homatropine (HYCODAN) 5-1.5 MG/5ML syrup Take 5 mLs by mouth every 8 (eight) hours as needed for cough. 120 mL 0  . Spacer/Aero-Holding Chambers (AEROCHAMBER PLUS FLO-VU MEDIUM) MISC 1 each by Other route once. 1 each 1  . Spacer/Aero-Holding Chambers (AEROCHAMBER PLUS WITH MASK) inhaler Use as instructed 1 each 2   No facility-administered medications prior to visit.    ROS Review of Systems  Constitutional: Negative for fever, chills, diaphoresis and fatigue.  Respiratory: Negative for chest tightness, shortness of breath and wheezing.  Cardiovascular: Negative for chest pain, palpitations and leg swelling.  Gastrointestinal: Negative for nausea, vomiting and diarrhea.  Endocrine: Positive for polydipsia. Negative for polyphagia and polyuria.  Skin: Negative for rash.  Neurological: Negative for dizziness, numbness and headaches.    Objective:  BP 110/70 mmHg  Pulse 91  Temp(Src) 97.6 F (36.4 C) (Oral)  Ht 5' (1.524 m)  Wt 197 lb 12 oz (89.699 kg)  BMI 38.62 kg/m2  SpO2 98%  Physical Exam  Constitutional: She is oriented to person, place, and time. She  appears well-developed and well-nourished. No distress.  Strong odor of cat urine today, poor hygeine  HENT:  Head: Normocephalic and atraumatic.  Right Ear: External ear normal.  Left Ear: External ear normal.  Cardiovascular: Normal rate and regular rhythm.   Pulmonary/Chest: Effort normal and breath sounds normal. No respiratory distress. She has no wheezes. She has no rales. She exhibits no tenderness.  Neurological: She is alert and oriented to person, place, and time. No cranial nerve deficit. She exhibits normal muscle tone. Coordination normal.  Skin: Skin is warm and dry. No rash noted. She is not diaphoretic.  Psychiatric: Her behavior is normal. Judgment and thought content normal.  Make-up is heavy and colorful   Assessment & Plan:   [name] was seen today for blood sugar problem.  Diagnoses and all orders for this visit:  Polydipsia -     HgB A1c; Future  Elevated glucose -     HgB A1c; Future   I have discontinued [name]'s AEROCHAMBER PLUS FLO-VU MEDIUM, amoxicillin, aerochamber plus with mask, and HYDROcodone-homatropine. I am also having her maintain her furosemide, diclofenac sodium, potassium chloride, norgestrel-ethinyl estradiol, ALPRAZolam, sertraline, Macitentan, OXYGEN, albuterol, PROAIR HFA, pantoprazole, and HYDROcodone-acetaminophen.  No orders of the defined types were placed in this encounter.     Follow-up: Return if symptoms worsen or fail to improve, for Has appointment on 04/25/16.

## 2016-04-06 ENCOUNTER — Other Ambulatory Visit: Payer: Self-pay | Admitting: Nurse Practitioner

## 2016-04-06 MED ORDER — METFORMIN HCL 500 MG PO TABS: 500.0000 mg | ORAL_TABLET | Freq: Two times a day (BID) | ORAL | Status: DC

## 2016-04-09 ENCOUNTER — Encounter: Payer: Self-pay | Admitting: Nurse Practitioner

## 2016-04-09 DIAGNOSIS — E1165 Type 2 diabetes mellitus with hyperglycemia: Secondary | ICD-10-CM | POA: Insufficient documentation

## 2016-04-09 DIAGNOSIS — E118 Type 2 diabetes mellitus with unspecified complications: Secondary | ICD-10-CM | POA: Insufficient documentation

## 2016-04-09 NOTE — Assessment & Plan Note (Signed)
Checking A1c today  Discussed next steps such as diabetes education and metformin  FU w/ PCP in May

## 2016-04-10 ENCOUNTER — Telehealth: Payer: Self-pay | Admitting: Internal Medicine

## 2016-04-10 MED ORDER — HYDROCODONE-ACETAMINOPHEN 10-325 MG PO TABS: 1.0000 | ORAL_TABLET | ORAL | Status: DC | PRN

## 2016-04-10 NOTE — Telephone Encounter (Signed)
She would need to bring in the old prescription to get a new one since we just gave it to her.

## 2016-04-10 NOTE — Telephone Encounter (Signed)
Pt called in and said that she is having issue with her pain med script.  Can you call her [name] when you get a chance? It is due to the dosage change.

## 2016-04-10 NOTE — Telephone Encounter (Signed)
Patient aware and will bring in the old rx and pick up the new one

## 2016-04-10 NOTE — Telephone Encounter (Signed)
Patient came in on 17th. She talked to you about giving her 10 mg instead of 7.5. The rx on the 17th was written 10 mg one q 8 hours. The new one was written the same way, but the pharmacy won't fill it because they are saying its too soon. The patient took 10 mg every 4 hours just like the old rx was written when it was 7.5. She is requesting a refill of   10 mg every 4 hours. Please advise, thanks.

## 2016-04-25 ENCOUNTER — Ambulatory Visit: Payer: Medicare Other | Admitting: Internal Medicine

## 2016-05-08 ENCOUNTER — Other Ambulatory Visit: Payer: Self-pay

## 2016-05-08 NOTE — Telephone Encounter (Signed)
Pt called and rq rf of hydrocodone.

## 2016-05-09 ENCOUNTER — Other Ambulatory Visit: Payer: Self-pay | Admitting: Internal Medicine

## 2016-05-09 MED ORDER — HYDROCODONE-ACETAMINOPHEN 10-325 MG PO TABS: 1.0000 | ORAL_TABLET | ORAL | Status: DC | PRN

## 2016-05-09 NOTE — Telephone Encounter (Signed)
Called patient and informed her that the rx is ready for pick up. Placed in cabinet up front.

## 2016-05-11 NOTE — Telephone Encounter (Signed)
MD out of office pls advise on refill.../lmb 

## 2016-05-12 NOTE — Telephone Encounter (Signed)
Called refill into pharmacy had to leave msg on pharmacy vm../lmb 

## 2016-06-05 ENCOUNTER — Telehealth: Payer: Self-pay | Admitting: *Deleted

## 2016-06-05 NOTE — Telephone Encounter (Signed)
Requesting refill on pain med Hydrocodone.../lmb 

## 2016-06-06 MED ORDER — HYDROCODONE-ACETAMINOPHEN 10-325 MG PO TABS: 1.0000 | ORAL_TABLET | ORAL | Status: DC | PRN

## 2016-06-06 NOTE — Telephone Encounter (Signed)
Notified [name] rx ready for pick-up.../lmb 

## 2016-06-06 NOTE — Telephone Encounter (Signed)
Printed and signed.  

## 2016-06-16 ENCOUNTER — Telehealth: Payer: Self-pay | Admitting: Internal Medicine

## 2016-06-16 NOTE — Telephone Encounter (Signed)
[facility]

## 2016-06-21 ENCOUNTER — Other Ambulatory Visit: Payer: Self-pay | Admitting: Nurse Practitioner

## 2016-06-22 ENCOUNTER — Telehealth: Payer: Self-pay

## 2016-06-22 ENCOUNTER — Other Ambulatory Visit: Payer: Self-pay | Admitting: Geriatric Medicine

## 2016-06-22 MED ORDER — METFORMIN HCL 500 MG PO TABS: 500.0000 mg | ORAL_TABLET | Freq: Two times a day (BID) | ORAL | Status: DC

## 2016-06-22 NOTE — Telephone Encounter (Signed)
metFORMIN (GLUCOPHAGE) 500 MG tablet  Patient is requesting a refill on this medication.

## 2016-06-22 NOTE — Telephone Encounter (Signed)
Sent to pharmacy 

## 2016-07-03 ENCOUNTER — Telehealth: Payer: Self-pay | Admitting: Emergency Medicine

## 2016-07-03 MED ORDER — HYDROCODONE-ACETAMINOPHEN 10-325 MG PO TABS: 1.0000 | ORAL_TABLET | ORAL | Status: DC | PRN

## 2016-07-03 NOTE — Telephone Encounter (Signed)
Patient needs a refill on HYDROcodone-acetaminophen (NORCO) 10-325 MG tablet. Please advice and give her a call back Thanks.

## 2016-07-03 NOTE — Telephone Encounter (Signed)
Printed and signed.  

## 2016-07-03 NOTE — Telephone Encounter (Signed)
Patient aware and placed in cabinet up front.

## 2016-07-06 ENCOUNTER — Other Ambulatory Visit: Payer: Self-pay | Admitting: Internal Medicine

## 2016-07-07 ENCOUNTER — Other Ambulatory Visit: Payer: Self-pay | Admitting: Internal Medicine

## 2016-07-07 NOTE — Telephone Encounter (Signed)
[facility]...lmb 

## 2016-08-01 ENCOUNTER — Telehealth: Payer: Self-pay | Admitting: *Deleted

## 2016-08-01 MED ORDER — HYDROCODONE-ACETAMINOPHEN 10-325 MG PO TABS: 1.0000 | ORAL_TABLET | ORAL | 0 refills | Status: DC | PRN

## 2016-08-01 NOTE — Telephone Encounter (Signed)
Notified [name] rx ready for pick-up...Raechel Chute/lmb

## 2016-08-01 NOTE — Telephone Encounter (Signed)
Rec'd call requesting refill on pt hydrocodone...Raechel Chute/lmb

## 2016-08-01 NOTE — Telephone Encounter (Signed)
Printed and signed.  

## 2016-08-30 ENCOUNTER — Other Ambulatory Visit: Payer: Self-pay | Admitting: Internal Medicine

## 2016-08-30 NOTE — Telephone Encounter (Signed)
Left msg on triage requesting refill on pt hydrocodone.../lmb 

## 2016-08-31 MED ORDER — HYDROCODONE-ACETAMINOPHEN 10-325 MG PO TABS: 1.0000 | ORAL_TABLET | ORAL | 0 refills | Status: DC | PRN

## 2016-08-31 NOTE — Telephone Encounter (Signed)
Tried calling pt/[name] again no answer & vm still full place rx's up front for pick-up.Marland Kitchen.Raechel Chute/lmb

## 2016-08-31 NOTE — Telephone Encounter (Signed)
Have printed and signed. She needs to schedule follow up for more refills.

## 2016-08-31 NOTE — Telephone Encounter (Signed)
Tried calling jason no answer & can't leave msg due to vm being full. Will call later...Raechel Chute/lmb

## 2016-09-28 ENCOUNTER — Other Ambulatory Visit: Payer: Self-pay | Admitting: Internal Medicine

## 2016-09-28 ENCOUNTER — Telehealth: Payer: Self-pay | Admitting: *Deleted

## 2016-09-28 MED ORDER — HYDROCODONE-ACETAMINOPHEN 10-325 MG PO TABS: 1.0000 | ORAL_TABLET | ORAL | 0 refills | Status: DC | PRN

## 2016-09-28 NOTE — Telephone Encounter (Signed)
Notified pt w/MD response. Made appt for tomorrow @ 11:15...Raechel Chute/lmb

## 2016-09-28 NOTE — Telephone Encounter (Signed)
Rec'd call requesting refill on pt Hydrocodone...Raechel Chute/lmb

## 2016-09-28 NOTE — Telephone Encounter (Signed)
Will deny. She was asked to schedule follow up last month for any refills and she has not done that. She can have 5 day supply and needs visit within that time.

## 2016-09-29 ENCOUNTER — Encounter: Payer: Self-pay | Admitting: Internal Medicine

## 2016-09-29 ENCOUNTER — Ambulatory Visit (INDEPENDENT_AMBULATORY_CARE_PROVIDER_SITE_OTHER): Payer: Medicare Other | Admitting: Internal Medicine

## 2016-09-29 ENCOUNTER — Ambulatory Visit: Payer: Medicare Other | Admitting: Internal Medicine

## 2016-09-29 ENCOUNTER — Other Ambulatory Visit (INDEPENDENT_AMBULATORY_CARE_PROVIDER_SITE_OTHER): Payer: Medicare Other

## 2016-09-29 VITALS — BP 112/60 | HR 86 | Temp 98.2°F | Resp 16 | Ht 60.0 in | Wt 187.0 lb

## 2016-09-29 DIAGNOSIS — R0600 Dyspnea, unspecified: Secondary | ICD-10-CM

## 2016-09-29 DIAGNOSIS — Z23 Encounter for immunization: Secondary | ICD-10-CM | POA: Diagnosis not present

## 2016-09-29 DIAGNOSIS — I50810 Right heart failure, unspecified: Secondary | ICD-10-CM

## 2016-09-29 DIAGNOSIS — Z8774 Personal history of (corrected) congenital malformations of heart and circulatory system: Secondary | ICD-10-CM

## 2016-09-29 DIAGNOSIS — I2729 Other secondary pulmonary hypertension: Secondary | ICD-10-CM | POA: Diagnosis not present

## 2016-09-29 DIAGNOSIS — Z9889 Other specified postprocedural states: Secondary | ICD-10-CM

## 2016-09-29 DIAGNOSIS — Z79899 Other long term (current) drug therapy: Secondary | ICD-10-CM | POA: Diagnosis not present

## 2016-09-29 DIAGNOSIS — E119 Type 2 diabetes mellitus without complications: Secondary | ICD-10-CM

## 2016-09-29 DIAGNOSIS — Z79891 Long term (current) use of opiate analgesic: Secondary | ICD-10-CM | POA: Diagnosis not present

## 2016-09-29 DIAGNOSIS — G894 Chronic pain syndrome: Secondary | ICD-10-CM

## 2016-09-29 LAB — BRAIN NATRIURETIC PEPTIDE: PRO B NATRI PEPTIDE: 64 pg/mL (ref 0.0–100.0)

## 2016-09-29 LAB — CBC
HEMATOCRIT: 47.5 % — AB (ref 36.0–46.0)
Hemoglobin: 15.5 g/dL — ABNORMAL HIGH (ref 12.0–15.0)
MCHC: 32.6 g/dL (ref 30.0–36.0)
MCV: 82.9 fl (ref 78.0–100.0)
Platelets: 335 10*3/uL (ref 150.0–400.0)
RBC: 5.73 Mil/uL — ABNORMAL HIGH (ref 3.87–5.11)
RDW: 16.5 % — ABNORMAL HIGH (ref 11.5–15.5)
WBC: 10.8 10*3/uL — ABNORMAL HIGH (ref 4.0–10.5)

## 2016-09-29 LAB — COMPREHENSIVE METABOLIC PANEL
ALT: 30 U/L (ref 0–35)
AST: 22 U/L (ref 0–37)
Albumin: 4.6 g/dL (ref 3.5–5.2)
Alkaline Phosphatase: 102 U/L (ref 39–117)
BILIRUBIN TOTAL: 0.5 mg/dL (ref 0.2–1.2)
BUN: 20 mg/dL (ref 6–23)
CHLORIDE: 96 meq/L (ref 96–112)
CO2: 39 meq/L — AB (ref 19–32)
CREATININE: 0.8 mg/dL (ref 0.40–1.20)
Calcium: 10.3 mg/dL (ref 8.4–10.5)
GFR: 88.36 mL/min (ref >60.00)
GLUCOSE: 144 mg/dL — AB (ref 70–99)
Potassium: 4.5 mEq/L (ref 3.5–5.1)
SODIUM: 141 meq/L (ref 135–145)
Total Protein: 8.4 g/dL — ABNORMAL HIGH (ref 6.0–8.3)

## 2016-09-29 LAB — MICROALBUMIN / CREATININE URINE RATIO
Creatinine,U: 118.6 mg/dL
Microalb Creat Ratio: 34.9 mg/g — ABNORMAL HIGH (ref 0.0–30.0)
Microalb, Ur: 41.4 mg/dL — ABNORMAL HIGH (ref 0.0–1.9)

## 2016-09-29 LAB — HEMOGLOBIN A1C: HEMOGLOBIN A1C: 6.8 % — AB (ref 4.6–6.5)

## 2016-09-29 MED ORDER — HYDROCODONE-ACETAMINOPHEN 10-325 MG PO TABS: 1.0000 | ORAL_TABLET | ORAL | 0 refills | Status: DC | PRN

## 2016-09-29 NOTE — Progress Notes (Signed)
Pre visit review using our clinic review tool, if applicable. No additional management support is needed unless otherwise documented below in the visit note. 

## 2016-09-29 NOTE — Assessment & Plan Note (Signed)
Checking HgA1c, foot exam done, checking microalbumin to creatinine ratio for complications. Explained the importance of eye exam yearly. With her other severe conditions we were not able to educate much on disease process. She did not have any questions. Adjust her metformin 500 mg BID as needed.

## 2016-09-29 NOTE — Patient Instructions (Signed)
You need to get an eye exam yearly for the diabetes. We are checking the labs today and will call you back with the results.   We have given you the prescription for the oxygen concentrator and the hydrocodone today.   We will get you in with a local cardiologist because we need to be monitoring your heart and lung disease to make sure you do not get serious complications later.   Diabetes and Standards of Medical Care Diabetes is complicated. You may find that your diabetes team includes a dietitian, nurse, diabetes educator, eye doctor, and more. To help everyone know what is going on and to help you get the care you deserve, the following schedule of care was developed to help keep you on track. Below are the tests, exams, vaccines, medicines, education, and plans you will need. HbA1c test This test shows how well you have controlled your glucose over the past 2-3 months. It is used to see if your diabetes management plan needs to be adjusted.   It is performed at least 2 times a year if you are meeting treatment goals.  It is performed 4 times a year if therapy has changed or if you are not meeting treatment goals. Blood pressure test  This test is performed at every routine medical visit. The goal is less than 140/90 mm Hg for most people, but 130/80 mm Hg in some cases. Ask your health care provider about your goal. Dental exam  Follow up with the dentist regularly. Eye exam  If you are diagnosed with type 1 diabetes as a child, get an exam upon reaching the age of 46 years or older and having had diabetes for 3-5 years. Yearly eye exams are recommended after that initial eye exam.  If you are diagnosed with type 1 diabetes as an adult, get an exam within 5 years of diagnosis and then yearly.  If you are diagnosed with type 2 diabetes, get an exam as soon as possible after the diagnosis and then yearly. Foot care exam  Visual foot exams are performed at every routine medical visit.  The exams check for cuts, injuries, or other problems with the feet.  You should have a complete foot exam performed every year. This exam includes an inspection of the structure and skin of your feet, a check of the pulses in your feet, and a check of the sensation in your feet.  Type 1 diabetes: The first exam is performed 5 years after diagnosis.  Type 2 diabetes: The first exam is performed at the time of diagnosis.  Check your feet nightly for cuts, injuries, or other problems with your feet. Tell your health care provider if anything is not healing. Kidney function test (urine microalbumin)  This test is performed once a year.  Type 1 diabetes: The first test is performed 5 years after diagnosis.  Type 2 diabetes: The first test is performed at the time of diagnosis.  A serum creatinine and estimated glomerular filtration rate (eGFR) test is done once a year to assess the level of chronic kidney disease (CKD), if present. Lipid profile (cholesterol, HDL, LDL, triglycerides)  Performed every 5 years for most people.  The goal for LDL is less than 100 mg/dL. If you are at high risk, the goal is less than 70 mg/dL.  The goal for HDL is 40 mg/dL-50 mg/dL for men and 50 mg/dL-60 mg/dL for women. An HDL cholesterol of 60 mg/dL or higher gives some protection against  heart disease.  The goal for triglycerides is less than 150 mg/dL. Immunizations  The flu (influenza) vaccine is recommended yearly for every person 32 months of age or older who has diabetes.  The pneumonia (pneumococcal) vaccine is recommended for every person 32 years of age or older who has diabetes. Adults 32 years of age or older may receive the pneumonia vaccine as a series of two separate shots.  The hepatitis B vaccine is recommended for adults shortly after they have been diagnosed with diabetes.  The Tdap (tetanus, diphtheria, and pertussis) vaccine should be given:  According to normal childhood vaccination  schedules, for children.  Every 10 years, for adults who have diabetes. Diabetes self-management education  Education is recommended at diagnosis and ongoing as needed. Treatment plan  Your treatment plan is reviewed at every medical visit.   This information is not intended to replace advice given to you by your health care provider. Make sure you discuss any questions you have with your health care provider.   Document Released: 10/08/2009 Document Revised: 01/01/2015 Document Reviewed: 05/13/2013 Elsevier Interactive Patient Education [employer].

## 2016-09-29 NOTE — Progress Notes (Signed)
   Subjective:    Patient ID: [name], female    DOB: [birth year], 30 y.o.   MRN: [id number]  HPI The patient is a 30 YO female coming in for follow up of her many medical problems including her right heart failure (due to pulmonary htn and TOF s/p repair, she has been fibbing to us about [facility], last visit we asked her to sign so we could get records and last visit with them is in 2016, she claims to still be taking her meds although this is unclear to me, she denies any worsening of her dyspnea, uses O2 all the time, no worsening swelling or edema, she then changes her story to that she cannot get there as she does not have transportation to get to [facility] and this is why she has not gone), her newly diagnosed diabetes (started on metformin in April without follow, taking metformin 500 mg BID without problems, no diarrhea, checking sugars and sometimes 160-170, denies numbness in her feet or hands, no eye exam in a long time), and her chronic pain syndrome (increase of dosage last visit, she is doing better with pain and taking less often, denies any worsening function or side effects, no constipation, no sedation, she is still able to do needed household chores).   Review of Systems  Constitutional: Negative for activity change, appetite change, fatigue and fever.  Respiratory: Negative for cough, chest tightness, shortness of breath and wheezing.   Cardiovascular: Negative for chest pain, palpitations and leg swelling.  Gastrointestinal: Negative.   Musculoskeletal: Positive for arthralgias and back pain. Negative for gait problem, joint swelling and myalgias.  Skin: Negative.   Neurological: Negative for dizziness, seizures, weakness and headaches.  Psychiatric/Behavioral: Negative.       Objective:   Physical Exam  Constitutional: She is oriented to person, place, and time. She appears well-developed and well-nourished.  HENT:  Head: Normocephalic and  atraumatic.  Eyes: EOM are normal.  Neck: Normal range of motion.  Cardiovascular: Normal rate.   Pulmonary/Chest: Effort normal and breath sounds normal.  Chronic O2  Abdominal: Soft. She exhibits no distension. There is no tenderness. There is no rebound.  Musculoskeletal: She exhibits no edema.  Neurological: She is alert and oriented to person, place, and time. Coordination normal.  Skin: Skin is warm and dry.  Psychiatric: She has a normal mood and affect.   Vitals:   09/29/16 0934  BP: 112/60  Pulse: 86  Resp: 16  Temp: 98.2 F (36.8 C)  TempSrc: Oral  SpO2: 98%  Weight: 187 lb (84.8 kg)  Height: 5' (1.524 m)      Assessment & Plan:  Flu shot given at visit Rx for #30 hydrocodone pills disposed of and new rx printed.

## 2016-09-30 NOTE — Assessment & Plan Note (Addendum)
Will refer to local cardiology since she is unable to travel [facility]. She is s/p TOF repair and with pulmonary htn causing right heart failure. Has been stable on lasix dosing for some time. It is unclear to me if she is really taking her pulmonary htn medication since records obtained from [facility] state she has not been there since 2015 or 2016. Talked to her about the possibility of scat or other assistance to help get her to visits and she is not interested. Checking BNP today. Given rx for oxygen concentrator for her chronic O2.

## 2016-09-30 NOTE — Assessment & Plan Note (Signed)
Spoke with her again today about the risks and harms of long term therapy and this medication is helping with her QOL and functionality. Refill given at visit and UDS collected. Reviewed [place] narcotic database without red flags.

## 2016-09-30 NOTE — Assessment & Plan Note (Signed)
She has not been to her provider for this is several years and talked to her about the importance since this can still progress due to the heart failure and pulmonary htn which can significantly shorten her life and worsen her QOL.

## 2016-10-06 ENCOUNTER — Encounter: Payer: Self-pay | Admitting: Internal Medicine

## 2016-10-25 ENCOUNTER — Other Ambulatory Visit: Payer: Self-pay | Admitting: Internal Medicine

## 2016-10-26 ENCOUNTER — Other Ambulatory Visit: Payer: Self-pay | Admitting: Internal Medicine

## 2016-10-26 ENCOUNTER — Ambulatory Visit (HOSPITAL_COMMUNITY)
Admission: RE | Admit: 2016-10-26 | Discharge: 2016-10-26 | Disposition: A | Discharge status: Home / Self Care | Payer: Medicare Other | Source: Ambulatory Visit | Attending: Internal Medicine | Admitting: Internal Medicine

## 2016-10-26 ENCOUNTER — Telehealth: Payer: Self-pay | Admitting: *Deleted

## 2016-10-26 ENCOUNTER — Encounter (HOSPITAL_COMMUNITY): Payer: Self-pay | Admitting: Internal Medicine

## 2016-10-26 VITALS — BP 146/78 | HR 88 | Wt 186.2 lb

## 2016-10-26 DIAGNOSIS — J9621 Acute and chronic respiratory failure with hypoxia: Secondary | ICD-10-CM | POA: Diagnosis not present

## 2016-10-26 DIAGNOSIS — E119 Type 2 diabetes mellitus without complications: Secondary | ICD-10-CM | POA: Diagnosis not present

## 2016-10-26 DIAGNOSIS — M419 Scoliosis, unspecified: Secondary | ICD-10-CM | POA: Insufficient documentation

## 2016-10-26 DIAGNOSIS — F419 Anxiety disorder, unspecified: Secondary | ICD-10-CM | POA: Insufficient documentation

## 2016-10-26 DIAGNOSIS — I272 Pulmonary hypertension, unspecified: Secondary | ICD-10-CM | POA: Insufficient documentation

## 2016-10-26 DIAGNOSIS — Z8774 Personal history of (corrected) congenital malformations of heart and circulatory system: Secondary | ICD-10-CM | POA: Insufficient documentation

## 2016-10-26 DIAGNOSIS — I110 Hypertensive heart disease with heart failure: Secondary | ICD-10-CM | POA: Diagnosis not present

## 2016-10-26 DIAGNOSIS — K219 Gastro-esophageal reflux disease without esophagitis: Secondary | ICD-10-CM | POA: Insufficient documentation

## 2016-10-26 DIAGNOSIS — Z7984 Long term (current) use of oral hypoglycemic drugs: Secondary | ICD-10-CM | POA: Diagnosis not present

## 2016-10-26 DIAGNOSIS — I2721 Secondary pulmonary arterial hypertension: Secondary | ICD-10-CM | POA: Diagnosis not present

## 2016-10-26 DIAGNOSIS — Q249 Congenital malformation of heart, unspecified: Secondary | ICD-10-CM | POA: Insufficient documentation

## 2016-10-26 DIAGNOSIS — I371 Nonrheumatic pulmonary valve insufficiency: Secondary | ICD-10-CM | POA: Insufficient documentation

## 2016-10-26 DIAGNOSIS — Z87891 Personal history of nicotine dependence: Secondary | ICD-10-CM | POA: Diagnosis not present

## 2016-10-26 DIAGNOSIS — J45909 Unspecified asthma, uncomplicated: Secondary | ICD-10-CM | POA: Insufficient documentation

## 2016-10-26 DIAGNOSIS — Q213 Tetralogy of Fallot: Secondary | ICD-10-CM | POA: Diagnosis not present

## 2016-10-26 DIAGNOSIS — J984 Other disorders of lung: Secondary | ICD-10-CM | POA: Diagnosis not present

## 2016-10-26 DIAGNOSIS — I509 Heart failure, unspecified: Secondary | ICD-10-CM | POA: Diagnosis not present

## 2016-10-26 MED ORDER — HYDROCODONE-ACETAMINOPHEN 10-325 MG PO TABS: 1.0000 | ORAL_TABLET | ORAL | 0 refills | Status: DC | PRN

## 2016-10-26 NOTE — Patient Instructions (Signed)
Follow up with Dr.Bensimhon as needed.  

## 2016-10-26 NOTE — Telephone Encounter (Signed)
Notified pt rx ready for pick-up.../lmb 

## 2016-10-26 NOTE — Telephone Encounter (Signed)
Left msg on triage requesting refill on Hydrocodone.../lmb 

## 2016-10-26 NOTE — Progress Notes (Signed)
ADVANCED HF CLINIC CONSULT NOTE   HPI:  [name] is a 30 y.o. female with a history of congenital heart disease referred by Dr. Okey Duprerawford for CHF/PAH consult.  . Tetralogy of Fallot s/p repair  . Absent left pulmonary artery, congenital  . Pulmonic regurgitation  . 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 Scoliosis  . DM2 . Restrictive lung disease    Previously followed by Drs. Marlane Mingleerry Fortin and Nash DimmerKerry Ward at [facility] but hasn't seen either of them since 2014. Continues to take Macitentan. Was previously on Adcirca but she says Dr. Monia PouchFortin stopped that.   Feels good. Gets around the house. Wears O2 at 2L up to 6L with exertion. No cyanosis. Edema well controlled. Denies chest pain, palpitations, orthopnea, PND.   Review of Systems: [y] = yes, [ ]  = no   General: Weight gain [ ] ; Weight loss [ ] ; Anorexia [ ] ; Fatigue [ ] ; Fever [ ] ; Chills [ ] ; Weakness [ ]   Cardiac: Chest pain/pressure [ ] ; Resting SOB [ ] ; Exertional SOB [ y]; Orthopnea [ ] ; Pedal Edema [ ] ; Palpitations [ ] ; Syncope [ ] ; Presyncope [ ] ; Paroxysmal nocturnal dyspnea[ ]   Pulmonary: Cough [ ] ; Wheezing[ ] ; Hemoptysis[ ] ; Sputum [ ] ; Snoring [ ]   GI: Vomiting[ ] ; Dysphagia[ ] ; Melena[ ] ; Hematochezia [ ] ; Heartburn[ ] ; Abdominal pain [ ] ; Constipation [ ] ; Diarrhea [ ] ; BRBPR [ ]   GU: Hematuria[ ] ; Dysuria [ ] ; Nocturia[ ]   Vascular: Pain in legs with walking [ ] ; Pain in feet with lying flat [ ] ; Non-healing sores [ ] ; Stroke [ ] ; TIA [ ] ; Slurred speech [ ] ;  Neuro: Headaches[ ] ; Vertigo[ ] ; Seizures[ ] ; Paresthesias[ ] ;Blurred vision [ ] ; Diplopia [ ] ; Vision changes [ ]   Ortho/Skin: Arthritis [ ] ; Joint pain [ ] ; Muscle pain [ ] ; Joint swelling [ ] ; Back Pain [ ] ; Rash [ ]   Psych: Depression[ ] ; Anxiety[y ]  Heme: Bleeding problems [ ] ; Clotting disorders [ ] ; Anemia [ ]   Endocrine: Diabetes [ ] ; Thyroid dysfunction[ ]    Past Medical History:  Diagnosis Date    . Anxiety   . Asthma    As a child  . Back pain   . GERD (gastroesophageal reflux disease)   . Heart failure (HCC)   . Pulmonary hypertension   . Scoliosis deformity of spine   . Tetralogy of Fallot     Current Outpatient Prescriptions  Medication Sig Dispense Refill  . ALPRAZolam (XANAX) 0.25 MG tablet TAKE 1 TO 2 TABLETS EVERY DAY AS NEEDED 60 tablet 1  . CRYSELLE-28 0.3-30 MG-MCG tablet TAKE 1 TABLET BY MOUTH DAILY. 28 tablet 7  . furosemide (LASIX) 20 MG tablet Take 40 mg by mouth daily. Take 40 mg by mouth daily.    Marland Kitchen. HYDROcodone-acetaminophen (NORCO) 10-325 MG tablet Take 1 tablet by mouth every 4 (four) hours as needed. 180 tablet 0  . Macitentan (OPSUMIT) 10 MG TABS Take 10 mg by mouth daily.    . metFORMIN (GLUCOPHAGE) 500 MG tablet Take 1 tablet (500 mg total) by mouth 2 (two) times daily with a meal. 180 tablet 3  . OXYGEN Inhale 3-6 L into the lungs continuous. Uses 3L at rest, up to 6L upon exertion    . pantoprazole (PROTONIX) 40 MG tablet TAKE 1 TABLET (40 MG TOTAL) BY MOUTH DAILY. 90 tablet 3  . potassium chloride (K-DUR) 10 MEQ tablet Take 1 tablet (10 mEq total) by  mouth daily. 30 tablet 5  . sertraline (ZOLOFT) 100 MG tablet TAKE 2 TABLETS (200 MG TOTAL) BY MOUTH DAILY. 180 tablet 3  . VENTOLIN HFA 108 (90 Base) MCG/ACT inhaler INHALE 2 PUFFS INTO THE LUNGS EVERY 6 HOURS AS NEEDED FOR WHEEZING OR SHORTNESS OF BREATH 18 Inhaler 2  . albuterol (PROVENTIL HFA;VENTOLIN HFA) 108 (90 Base) MCG/ACT inhaler Inhale 2 puffs into the lungs every 2 (two) hours as needed for wheezing or shortness of breath (cough). (Patient not taking: Reported on 10/26/2016) 1 Inhaler 0   No current facility-administered medications for this encounter.     Allergies  Allergen Reactions  . Avelox [Moxifloxacin Hcl In Nacl] Hives and Itching      Social History   Social History  . Marital status: Single    Spouse name: N/A  . Number of children: 0  . Years of education: N/A    Occupational History  . Not on file.   Social History Main Topics  . Smoking status: Former Smoker    Packs/day: 1.50    Years: 10.00    Types: Cigarettes    Quit date: 02/02/2011  . Smokeless tobacco: Never Used  . Alcohol use No  . Drug use: No  . Sexual activity: Not on file   Other Topics Concern  . Not on file   Social History Narrative   Unemployed at present.  Worked as Conservation officer, naturecashier at [employer].   [employer], [employer]- a little bit   Single, in a 4 year relationship   No children    Unemployed   No history of abuse      Family History  Problem Relation Age of Onset  . Diabetes Mother   . Hypertension Mother   . Cancer Mother     uterine cancer  . Alcohol abuse Father   . Cancer Maternal Aunt     Breast Cancer, Lung cancer    Vitals:   10/26/16 1125  BP: (!) 146/78  Pulse: 88  SpO2: 98%  Weight: 186 lb 4 oz (84.5 kg)    PHYSICAL EXAM: General:  Walked into clinic. No respiratory difficulty. Wearing O2 HEENT: normal Neck: supple. JVP 7-8. Carotids 2+ bilat; no bruits. No lymphadenopathy or thryomegaly appreciated. Cor: PMI nondisplaced. Regular rate & rhythm. 2/6 SEM LSB. Soft diastolic murmur Prominent P2  Lungs: clear Abdomen: soft, nontender, nondistended. No hepatosplenomegaly. No bruits or masses. Good bowel sounds. Extremities: no cyanosis, clubbing, rash, edema Neuro: alert & oriented x 3, cranial nerves grossly intact. moves all 4 extremities w/o difficulty. Affect pleasant.    ASSESSMENT & PLAN:  1. Congenital heart disease with Tetralogy of Fallot s/p repair & sbsent left pulmonary artery, congenital  2. PAH on macitentan 3. DM2 4. Chronic respiratory failure in setting of CHD and scoliosis with restrictive lung disease   Overall doing fairly well despite lack of f/u. Volume status ok. We discussed need for f/u going forward. I think she would be best served by seeing Dr. Deatra JamesKrasuski in the [facility] clinic here in [place]. She is  amenable to this. Will continue macitentan Will need echo with Dr. Deatra JamesKrasuski,   I have contacted Dr. Deatra JamesKrasuski personally and he will arrange f/u. Can f/u with us PRN.   Reece Mcbroom,MD 11:46 AM

## 2016-10-26 NOTE — Telephone Encounter (Signed)
Can be picked up tomorrow.

## 2016-10-27 NOTE — Telephone Encounter (Signed)
Sent to pharmacy 

## 2016-11-22 ENCOUNTER — Telehealth: Payer: Self-pay | Admitting: *Deleted

## 2016-11-22 NOTE — Telephone Encounter (Signed)
Rec'd call pt requesting to pick up rx on her hydrocodone Friday...Raechel Chute/lmb

## 2016-11-23 MED ORDER — HYDROCODONE-ACETAMINOPHEN 10-325 MG PO TABS: 1.0000 | ORAL_TABLET | ORAL | 0 refills | Status: DC | PRN

## 2016-11-23 NOTE — Telephone Encounter (Signed)
Patient aware rx is ready for pickup. 

## 2016-12-07 DIAGNOSIS — Q2579 Other congenital malformations of pulmonary artery: Secondary | ICD-10-CM | POA: Diagnosis not present

## 2016-12-07 DIAGNOSIS — Z9889 Other specified postprocedural states: Secondary | ICD-10-CM | POA: Diagnosis not present

## 2016-12-07 DIAGNOSIS — I371 Nonrheumatic pulmonary valve insufficiency: Secondary | ICD-10-CM | POA: Diagnosis not present

## 2016-12-07 DIAGNOSIS — Q213 Tetralogy of Fallot: Secondary | ICD-10-CM | POA: Diagnosis not present

## 2016-12-07 DIAGNOSIS — Q249 Congenital malformation of heart, unspecified: Secondary | ICD-10-CM | POA: Diagnosis not present

## 2016-12-07 DIAGNOSIS — I279 Pulmonary heart disease, unspecified: Secondary | ICD-10-CM | POA: Diagnosis not present

## 2016-12-07 DIAGNOSIS — Z8774 Personal history of (corrected) congenital malformations of heart and circulatory system: Secondary | ICD-10-CM | POA: Diagnosis not present

## 2016-12-22 ENCOUNTER — Telehealth: Payer: Self-pay

## 2016-12-22 NOTE — Telephone Encounter (Signed)
Pt lm and is rq rf for hydrocodone.

## 2016-12-26 MED ORDER — HYDROCODONE-ACETAMINOPHEN 10-325 MG PO TABS: 1.0000 | ORAL_TABLET | ORAL | 0 refills | Status: DC | PRN

## 2016-12-26 NOTE — Telephone Encounter (Signed)
Printed and signed.  

## 2016-12-26 NOTE — Telephone Encounter (Signed)
Pt informed rx is ready for pick up

## 2016-12-29 ENCOUNTER — Other Ambulatory Visit: Payer: Self-pay | Admitting: Internal Medicine

## 2017-01-23 ENCOUNTER — Telehealth: Payer: Self-pay | Admitting: *Deleted

## 2017-01-23 NOTE — Telephone Encounter (Signed)
Rec'd call pt spouse requesting refill on her Hydrocodone...Raechel Chute/lmb

## 2017-01-24 ENCOUNTER — Encounter: Payer: Self-pay | Admitting: Internal Medicine

## 2017-01-24 ENCOUNTER — Other Ambulatory Visit: Payer: Medicare Other

## 2017-01-24 DIAGNOSIS — Z79899 Other long term (current) drug therapy: Secondary | ICD-10-CM | POA: Diagnosis not present

## 2017-01-24 DIAGNOSIS — Z79891 Long term (current) use of opiate analgesic: Secondary | ICD-10-CM | POA: Diagnosis not present

## 2017-01-24 MED ORDER — HYDROCODONE-ACETAMINOPHEN 10-325 MG PO TABS: 1.0000 | ORAL_TABLET | ORAL | 0 refills | Status: DC | PRN

## 2017-01-24 NOTE — Telephone Encounter (Signed)
Notified [name] w/MD response. Place rx up front for pick-up...Raechel Chute/lmb

## 2017-01-24 NOTE — Telephone Encounter (Signed)
She needs to come pick up with UDS.

## 2017-01-27 ENCOUNTER — Other Ambulatory Visit: Payer: Self-pay | Admitting: Internal Medicine

## 2017-01-29 ENCOUNTER — Other Ambulatory Visit: Payer: Self-pay | Admitting: Internal Medicine

## 2017-01-30 ENCOUNTER — Telehealth: Payer: Self-pay | Admitting: Internal Medicine

## 2017-01-30 NOTE — Telephone Encounter (Signed)
Contacted patient husband and left number for her to call back at her convience

## 2017-01-30 NOTE — Telephone Encounter (Signed)
Patent contacted and stated awareness 

## 2017-01-30 NOTE — Telephone Encounter (Signed)
Please call and let patient know that because her recent urine drug screen was not appropriate she will need visit with pcp to discuss any further refills of controlled substances. Her screen was negative for the 2 prescribed substances and positive for a controlled substance which she is not prescribed. We will likely start tapering down her hydrocodone to smaller amounts to get her off this medicine or can refer her to pain clinic if she would prefer.

## 2017-01-30 NOTE — Telephone Encounter (Signed)
Patient called back. Please give her a call back when you get a chance thanks.

## 2017-01-30 NOTE — Telephone Encounter (Signed)
LVM to call back at her convience

## 2017-02-10 ENCOUNTER — Other Ambulatory Visit: Payer: Self-pay | Admitting: Internal Medicine

## 2017-02-21 ENCOUNTER — Encounter: Payer: Self-pay | Admitting: Internal Medicine

## 2017-02-21 ENCOUNTER — Ambulatory Visit (INDEPENDENT_AMBULATORY_CARE_PROVIDER_SITE_OTHER): Payer: Medicare Other | Admitting: Internal Medicine

## 2017-02-21 DIAGNOSIS — G894 Chronic pain syndrome: Secondary | ICD-10-CM | POA: Diagnosis not present

## 2017-02-21 DIAGNOSIS — F419 Anxiety disorder, unspecified: Secondary | ICD-10-CM | POA: Diagnosis not present

## 2017-02-21 MED ORDER — HYDROCODONE-ACETAMINOPHEN 10-325 MG PO TABS: 1.0000 | ORAL_TABLET | ORAL | 0 refills | Status: DC | PRN

## 2017-02-21 NOTE — Progress Notes (Signed)
   Subjective:    Patient ID: [name], female    DOB: 05-23-1986, 31 y.o.   MRN: [id number]  HPI The patient is a 31 YO female coming in for discussion of her controlled substances. She had an inappropriate UDS last month and this made her second inappropriate UDS. She did not have either of her 2 prescribed substances in her blood stream last UDS and did have another medication which was not prescribed for her. She does admit to GI illness which kept her from taking her narcotic about 3 days prior to last UDS. She does admit to rarely taking xanax which is why that has not been present the last 2 UDS episodes and was with her mother and had a panic attack. Her xanax was at home and she took something that her mother had and did not know what it was. She does admit to usually taking her pain medication as prescribed only and denies running out early at the end of the month.   Review of Systems  Constitutional: Negative.   Respiratory: Negative for cough, chest tightness and shortness of breath.   Cardiovascular: Negative for chest pain, palpitations and leg swelling.  Gastrointestinal: Negative for abdominal distention, abdominal pain, constipation, diarrhea, nausea and vomiting.  Musculoskeletal: Positive for arthralgias, back pain and myalgias. Negative for gait problem.  Skin: Negative.   Neurological: Negative.   Psychiatric/Behavioral: Positive for decreased concentration and dysphoric mood. The patient is nervous/anxious.       Objective:   Physical Exam  Constitutional: She is oriented to person, place, and time. She appears well-developed and well-nourished.  Not well groomed  HENT:  Head: Normocephalic and atraumatic.  Eyes: EOM are normal.  Neck: Normal range of motion.  Cardiovascular: Normal rate and regular rhythm.   Pulmonary/Chest: Effort normal and breath sounds normal.  Abdominal: Soft.  Musculoskeletal: She exhibits no edema.  Neurological: She is alert and  oriented to person, place, and time.  Skin: Skin is warm and dry.   Vitals:   02/21/17 1018  BP: 140/82  Pulse: 95  Temp: 98.3 F (36.8 C)  TempSrc: Oral  SpO2: 95%  Weight: 181 lb (82.1 kg)  Height: 5' (1.524 m)      Assessment & Plan:  Visit time 25 minutes: greater than 50% of that was spent in face to face counseling and coordination of care with the patient: counseling regarding the serious nature of controlled substances and the problems we have with her UDS and why this matters and the plan of action going forward.

## 2017-02-21 NOTE — Progress Notes (Signed)
Pre visit review using our clinic review tool, if applicable. No additional management support is needed unless otherwise documented below in the visit note. 

## 2017-02-21 NOTE — Patient Instructions (Addendum)
I'm sorry about your grandma.

## 2017-02-23 NOTE — Assessment & Plan Note (Signed)
She does deny taking this daily but taking 3-4 for panic episodes when they occur. We did discuss that since she is faithfully filling every month she should be having this in her urine and if this is not she will be stopped from getting the xanax because even if she is taking 4 at a time she is using 15 days per month to fill every 30 days and this should be in her system during one of her UDS screens and it has not been. We can try transition to buspar if needed or she can decide to see psych if further abnormal UDS.

## 2017-02-23 NOTE — Assessment & Plan Note (Signed)
We did discuss that any further inappropriate UDS will results in immediate cessation of prescriptions from us and titration down to no narcotics. She understands and also knows that we will be checking random UDS screen on her in the future and that she will now be undergoing more scrutiny.

## 2017-02-28 ENCOUNTER — Other Ambulatory Visit: Payer: Self-pay | Admitting: Internal Medicine

## 2017-03-07 ENCOUNTER — Telehealth: Payer: Self-pay | Admitting: Internal Medicine

## 2017-03-07 ENCOUNTER — Encounter: Payer: Self-pay | Admitting: Internal Medicine

## 2017-03-07 DIAGNOSIS — Z79891 Long term (current) use of opiate analgesic: Secondary | ICD-10-CM | POA: Diagnosis not present

## 2017-03-07 NOTE — Telephone Encounter (Signed)
LVM stating she needed to come in today for uds

## 2017-03-07 NOTE — Telephone Encounter (Signed)
Please call today for random UDS. Must come in today during business hours.

## 2017-03-07 NOTE — Telephone Encounter (Signed)
Left another voice  Mail stating to come in today during business hours and to call back

## 2017-03-07 NOTE — Telephone Encounter (Signed)
Called home number from demographics and got an answer, patient stated awareness that she needs to come in today but stated that she did not have a car that her dad is her transportation that she probably wont make it

## 2017-03-19 ENCOUNTER — Telehealth: Payer: Self-pay | Admitting: *Deleted

## 2017-03-19 MED ORDER — HYDROCODONE-ACETAMINOPHEN 10-325 MG PO TABS: 1.0000 | ORAL_TABLET | ORAL | 0 refills | Status: DC | PRN

## 2017-03-19 NOTE — Telephone Encounter (Signed)
Rec'd call husband requesting refill on pt Hydrocodone...Raechel Chute/lmb

## 2017-03-19 NOTE — Telephone Encounter (Signed)
Printed and signed.  

## 2017-03-30 ENCOUNTER — Other Ambulatory Visit: Payer: Self-pay | Admitting: Internal Medicine

## 2017-04-17 ENCOUNTER — Telehealth: Payer: Self-pay | Admitting: *Deleted

## 2017-04-17 NOTE — Telephone Encounter (Signed)
Request refill on Hydrocodone.Marland KitchenRaechel Chute

## 2017-04-18 MED ORDER — HYDROCODONE-ACETAMINOPHEN 10-325 MG PO TABS: 1.0000 | ORAL_TABLET | ORAL | 0 refills | Status: DC | PRN

## 2017-04-18 NOTE — Telephone Encounter (Signed)
Printed and signed.  

## 2017-04-18 NOTE — Telephone Encounter (Signed)
Notified [name] rx ready for pick-up.../lmb 

## 2017-04-26 ENCOUNTER — Other Ambulatory Visit: Payer: Self-pay | Admitting: Internal Medicine

## 2017-05-05 ENCOUNTER — Other Ambulatory Visit: Payer: Self-pay | Admitting: Internal Medicine

## 2017-05-15 ENCOUNTER — Telehealth: Payer: Self-pay | Admitting: *Deleted

## 2017-05-15 MED ORDER — HYDROCODONE-ACETAMINOPHEN 10-325 MG PO TABS: 1.0000 | ORAL_TABLET | ORAL | 0 refills | Status: DC | PRN

## 2017-05-15 NOTE — Telephone Encounter (Signed)
Notified pt rx ready for pick-up,,,/lmb 

## 2017-05-15 NOTE — Telephone Encounter (Signed)
Rec'd call requesting refill on wife Hydrocodone...Raechel Chute/lmb

## 2017-05-15 NOTE — Telephone Encounter (Signed)
Printed and signed.  

## 2017-06-13 ENCOUNTER — Telehealth: Payer: Self-pay | Admitting: *Deleted

## 2017-06-13 ENCOUNTER — Other Ambulatory Visit: Payer: Self-pay | Admitting: Family

## 2017-06-13 MED ORDER — HYDROCODONE-ACETAMINOPHEN 10-325 MG PO TABS: 1.0000 | ORAL_TABLET | ORAL | 0 refills | Status: DC | PRN

## 2017-06-13 NOTE — Telephone Encounter (Signed)
Rec'd call pt requestig refill on her Hydrocodone. Last filled 05/15/17 MD out of office pls advise....Raechel Chute/lmb

## 2017-06-13 NOTE — Telephone Encounter (Signed)
Kathie RhodesBetty called pt informed her rx ready for pick-up...Raechel Chute/lmb

## 2017-06-13 NOTE — Telephone Encounter (Signed)
[place] Controlled Substance Database reviewed with no irregularities. Medication dated for 30 day refill.

## 2017-06-28 ENCOUNTER — Telehealth: Payer: Self-pay | Admitting: Internal Medicine

## 2017-06-28 NOTE — Telephone Encounter (Signed)
Last dispensed 05/30/2017, 60 tabs for 30 days

## 2017-07-11 ENCOUNTER — Telehealth: Payer: Self-pay | Admitting: Internal Medicine

## 2017-07-11 ENCOUNTER — Telehealth: Payer: Self-pay

## 2017-07-11 MED ORDER — HYDROCODONE-ACETAMINOPHEN 10-325 MG PO TABS: 1.0000 | ORAL_TABLET | ORAL | 0 refills | Status: DC | PRN

## 2017-07-11 NOTE — Telephone Encounter (Signed)
Patient aware she has to come pick up rx for hydrocodone

## 2017-07-11 NOTE — Telephone Encounter (Signed)
Medication refilled. UDS at pick up please.

## 2017-07-11 NOTE — Telephone Encounter (Signed)
Please advise , last dispensed 06/14/2017 with 180 tabs for 30 days

## 2017-07-11 NOTE — Telephone Encounter (Signed)
[facility], tried calling jason and LVM stating [facility] and that ill try faxing again

## 2017-07-11 NOTE — Telephone Encounter (Signed)
Pts fiance called checking to see if this medication was ready to be picked up. I see that it was filled on 06/28/2017 by Tammy SoursGreg. He can Barbara CowerJason can be reached at [phone].

## 2017-07-11 NOTE — Telephone Encounter (Signed)
Pt would like a refill of HYDROcodone-acetaminophen (NORCO) 10-325 MG tablet   Also the Pt needs a proper DPR on file, her fiance Barbara CowerJason is not on it.

## 2017-07-11 NOTE — Telephone Encounter (Signed)
Contacted number in last phone note, the number is a friend so I wasn't able to give any info to him, just requested that he ask Toni AmendCourtney to call our office in regards.  He said her phone line is down and he will try and get in contact with her to call me back

## 2017-07-12 ENCOUNTER — Encounter: Payer: Self-pay | Admitting: Internal Medicine

## 2017-07-12 DIAGNOSIS — Z79899 Other long term (current) drug therapy: Secondary | ICD-10-CM | POA: Diagnosis not present

## 2017-07-12 DIAGNOSIS — Z79891 Long term (current) use of opiate analgesic: Secondary | ICD-10-CM | POA: Diagnosis not present

## 2017-07-23 ENCOUNTER — Telehealth: Payer: Self-pay

## 2017-07-23 NOTE — Telephone Encounter (Signed)
Recommend no further controlled substances be prescribed.

## 2017-07-23 NOTE — Telephone Encounter (Signed)
Patients urine drug screen came back negative/ not detected for any of her controlled medications, [place] wanted patient scheduled for pill count since she has been getting medication filled every month and it should of been in system (checked controlled substance website), I have been unable to get a hold of patient, home number is disconnected and spouses number is as well. There is a number in phone notes from last week which is a friends number, called and and just asked that he get her to call us, gave him no other information. She said he will ask her to call.

## 2017-08-08 NOTE — Telephone Encounter (Signed)
Spoke with pt and advised that we will not be giving any more refills on pain medications due to recent UDS that came up negative for all her medications.

## 2017-08-08 NOTE — Telephone Encounter (Signed)
Pt fiance Barbara CowerJason called to make an appointment and to get a refill of her Hydrocodone.  I advised I do not believe we will be prescribing that anymore, he asked why, he called the Med in on Monday and she did the drug test on a Thursday, but she had not taken it for 3 days and that is why she didn't have any in her system. I did not tell him she did not have any in her system he already knew that. He said she had been out and sick and that is why she didn't have any in her system, Barbara CowerJason was on a minute phone and ran out of minutes while we were talking and he got cut off.  Barbara CowerJason [facility] not [facility]   Please advise

## 2017-08-08 NOTE — Telephone Encounter (Signed)
I called [name]'s phone and a friend answered. I asked the friend to have Barbara CowerJason to have Dunmorourtney the pt to contact us DIRECTLY herself to discuss some personal information that we can not discuss with Barbara CowerJason. Friend stated that he will relay the message.

## 2017-08-09 ENCOUNTER — Ambulatory Visit: Payer: Medicare Other | Admitting: Family

## 2017-08-30 ENCOUNTER — Other Ambulatory Visit (INDEPENDENT_AMBULATORY_CARE_PROVIDER_SITE_OTHER): Payer: Medicare Other

## 2017-08-30 ENCOUNTER — Encounter: Payer: Self-pay | Admitting: Internal Medicine

## 2017-08-30 ENCOUNTER — Ambulatory Visit (INDEPENDENT_AMBULATORY_CARE_PROVIDER_SITE_OTHER): Payer: Medicare Other | Admitting: Internal Medicine

## 2017-08-30 VITALS — BP 130/76 | HR 97 | Temp 97.7°F | Ht 60.0 in | Wt 184.0 lb

## 2017-08-30 DIAGNOSIS — Z23 Encounter for immunization: Secondary | ICD-10-CM | POA: Diagnosis not present

## 2017-08-30 DIAGNOSIS — E119 Type 2 diabetes mellitus without complications: Secondary | ICD-10-CM

## 2017-08-30 DIAGNOSIS — G894 Chronic pain syndrome: Secondary | ICD-10-CM

## 2017-08-30 LAB — LIPID PANEL
CHOL/HDL RATIO: 5
Cholesterol: 257 mg/dL — ABNORMAL HIGH (ref 0–200)
HDL: 49.5 mg/dL (ref >39.00)
NONHDL: 207.46
TRIGLYCERIDES: 220 mg/dL — AB (ref 0.0–149.0)
VLDL: 44 mg/dL — AB (ref 0.0–40.0)

## 2017-08-30 LAB — CBC
HEMATOCRIT: 40 % (ref 36.0–46.0)
HEMOGLOBIN: 12.8 g/dL (ref 12.0–15.0)
MCHC: 32 g/dL (ref 30.0–36.0)
MCV: 88.1 fl (ref 78.0–100.0)
PLATELETS: 234 10*3/uL (ref 150.0–400.0)
RBC: 4.54 Mil/uL (ref 3.87–5.11)
RDW: 14.5 % (ref 11.5–15.5)
WBC: 9.5 10*3/uL (ref 4.0–10.5)

## 2017-08-30 LAB — COMPREHENSIVE METABOLIC PANEL
ALT: 77 U/L — AB (ref 0–35)
AST: 35 U/L (ref 0–37)
Albumin: 4.4 g/dL (ref 3.5–5.2)
Alkaline Phosphatase: 128 U/L — ABNORMAL HIGH (ref 39–117)
BILIRUBIN TOTAL: 0.7 mg/dL (ref 0.2–1.2)
BUN: 16 mg/dL (ref 6–23)
CHLORIDE: 92 meq/L — AB (ref 96–112)
CO2: 39 meq/L — AB (ref 19–32)
Calcium: 9.6 mg/dL (ref 8.4–10.5)
Creatinine, Ser: 0.66 mg/dL (ref 0.40–1.20)
GFR: 109.69 mL/min (ref >60.00)
GLUCOSE: 227 mg/dL — AB (ref 70–99)
Potassium: 4.1 mEq/L (ref 3.5–5.1)
Sodium: 138 mEq/L (ref 135–145)
Total Protein: 7.6 g/dL (ref 6.0–8.3)

## 2017-08-30 LAB — LDL CHOLESTEROL, DIRECT: Direct LDL: 150 mg/dL

## 2017-08-30 LAB — HEMOGLOBIN A1C: Hgb A1c MFr Bld: 8.7 % — ABNORMAL HIGH (ref 4.6–6.5)

## 2017-08-30 MED ORDER — DOXYCYCLINE HYCLATE 100 MG PO TABS: 100.0000 mg | ORAL_TABLET | Freq: Two times a day (BID) | ORAL | 0 refills | Status: DC

## 2017-08-30 MED ORDER — HYDROCODONE-ACETAMINOPHEN 10-325 MG PO TABS: 1.0000 | ORAL_TABLET | ORAL | 0 refills | Status: DC | PRN

## 2017-08-30 MED ORDER — NORGESTREL-ETHINYL ESTRADIOL 0.3-30 MG-MCG PO TABS: 1.0000 | ORAL_TABLET | Freq: Every day | ORAL | 11 refills | Status: DC

## 2017-08-30 NOTE — Progress Notes (Signed)
   Subjective:    Patient ID: [name], female    DOB: Feb 13, 1986, 31 y.o.   MRN: [id number]  HPI The patient is a 31 YO female coming in for follow up of her chronic pain. She has been cut off from medication due to an inappropriate drug screen this summer by another provider. She states that she has having a pain flare and took too many pills. She told them this when she provided her urine sample and is upset about being cut off. 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 She does know now not to take too much medication.   Review of Systems  Constitutional: Positive for fatigue. Negative for activity change, appetite change, fever and unexpected weight change.  HENT: Negative.   Eyes: Negative.   Respiratory: Positive for shortness of breath. Negative for cough and chest tightness.   Cardiovascular: Negative for chest pain, palpitations and leg swelling.  Gastrointestinal: Negative.   Musculoskeletal: Positive for arthralgias, back pain, gait problem and myalgias. Negative for joint swelling, neck pain and neck stiffness.  Skin: Negative.   Hematological: Negative.       Objective:   Physical Exam  Constitutional: She is oriented to person, place, and time. She appears well-developed and well-nourished.  HENT:  Head: Normocephalic and atraumatic.  Eyes: EOM are normal.  Neck: Normal range of motion.  Cardiovascular: Normal rate.   Pulmonary/Chest: Effort normal. No respiratory distress. She has no wheezes.  On oxygen  Abdominal: Soft. She exhibits no distension. There is no tenderness. There is no rebound.  Neurological: She is alert and oriented to person, place, and time.  Skin: Skin is warm and dry.   Vitals:   08/30/17 1342  BP: 130/76  Pulse: 97  Temp: 97.7 F (36.5 C)  TempSrc: Oral  SpO2: 98%  Weight: 184 lb (83.5 kg)  Height: 5' (1.524 m)      Assessment & Plan:  Flu shot given at  visit.

## 2017-08-30 NOTE — Patient Instructions (Signed)
We have refilled the hydrocodone for you today.   We will check the labs today and call you with the results.   We have sent in the refill of albuterol and sent in doxycycline. Take 1 pill twice a day for a week.

## 2017-09-02 NOTE — Assessment & Plan Note (Signed)
She will be given a prescription but she was reminded for the last time that she is not supposed to take medication outside our contract and that doing so again will result in her loss of controlled substance prescribing from our office.

## 2017-09-21 ENCOUNTER — Other Ambulatory Visit: Payer: Self-pay | Admitting: Internal Medicine

## 2017-09-24 ENCOUNTER — Other Ambulatory Visit: Payer: Self-pay

## 2017-09-24 ENCOUNTER — Other Ambulatory Visit: Payer: Self-pay | Admitting: Internal Medicine

## 2017-09-25 NOTE — Telephone Encounter (Signed)
[facility] [address]

## 2017-09-26 ENCOUNTER — Telehealth: Payer: Self-pay | Admitting: *Deleted

## 2017-09-26 NOTE — Telephone Encounter (Signed)
Left msg on triage requesting refill on pt hydrocodone.../lmb 

## 2017-09-27 MED ORDER — HYDROCODONE-ACETAMINOPHEN 10-325 MG PO TABS: 1.0000 | ORAL_TABLET | ORAL | 0 refills | Status: DC | PRN

## 2017-09-27 NOTE — Telephone Encounter (Signed)
Please call this number when it is ready   [phone]

## 2017-09-27 NOTE — Telephone Encounter (Signed)
Called pt no answer LMOM w/MD response. Put rx up front for pick-up...Raechel Chute

## 2017-09-27 NOTE — Telephone Encounter (Signed)
Needs UDS at pick up.  

## 2017-10-20 ENCOUNTER — Other Ambulatory Visit: Payer: Self-pay | Admitting: Internal Medicine

## 2017-10-29 ENCOUNTER — Telehealth: Payer: Self-pay | Admitting: Internal Medicine

## 2017-10-29 DIAGNOSIS — F1120 Opioid dependence, uncomplicated: Secondary | ICD-10-CM

## 2017-10-29 MED ORDER — HYDROCODONE-ACETAMINOPHEN 10-325 MG PO TABS: 1.0000 | ORAL_TABLET | ORAL | 0 refills | Status: DC | PRN

## 2017-10-29 NOTE — Telephone Encounter (Signed)
Needs UDS and letter with pickup.

## 2017-10-29 NOTE — Telephone Encounter (Signed)
Patient contacted and informed. Letter placed with RX and highlighted on front of envelope needs UDS

## 2017-10-29 NOTE — Telephone Encounter (Signed)
Patient requesting refill on hydrocodone.

## 2017-10-29 NOTE — Telephone Encounter (Signed)
Check [place] registry last filled 09/27/2017../lmb  

## 2017-10-30 ENCOUNTER — Other Ambulatory Visit: Payer: Medicare Other

## 2017-10-30 ENCOUNTER — Other Ambulatory Visit: Payer: Self-pay | Admitting: General Practice

## 2017-10-30 DIAGNOSIS — F1120 Opioid dependence, uncomplicated: Secondary | ICD-10-CM

## 2017-11-07 LAB — PAIN MGMT, PROFILE 8 W/CONF, U
6 ACETYLMORPHINE: NEGATIVE ng/mL (ref <10)
ALPHAHYDROXYALPRAZOLAM: 103 ng/mL — AB (ref <25)
ALPHAHYDROXYTRIAZOLAM: NEGATIVE ng/mL (ref <50)
Alcohol Metabolites: NEGATIVE ng/mL (ref <500)
Alphahydroxymidazolam: NEGATIVE ng/mL (ref <50)
Aminoclonazepam: NEGATIVE ng/mL (ref <25)
Amphetamines: NEGATIVE ng/mL (ref <500)
BENZODIAZEPINES: POSITIVE ng/mL — AB (ref <100)
Buprenorphine, Urine: NEGATIVE ng/mL (ref <5)
CODEINE: NEGATIVE ng/mL (ref <50)
Cocaine Metabolite: NEGATIVE ng/mL (ref <150)
Creatinine: 103 mg/dL
Hydrocodone: 6083 ng/mL — ABNORMAL HIGH (ref <50)
Hydromorphone: 2096 ng/mL — ABNORMAL HIGH (ref <50)
Hydroxyethylflurazepam: NEGATIVE ng/mL (ref <50)
Lorazepam: NEGATIVE ng/mL (ref <50)
MDMA: NEGATIVE ng/mL (ref <500)
Marijuana Metabolite: NEGATIVE ng/mL (ref <20)
Morphine: NEGATIVE ng/mL (ref <50)
NORDIAZEPAM: NEGATIVE ng/mL (ref <50)
NORHYDROCODONE: 4816 ng/mL — AB (ref <50)
OPIATES: POSITIVE ng/mL — AB (ref <100)
OXAZEPAM: NEGATIVE ng/mL (ref <50)
OXYCODONE: NEGATIVE ng/mL (ref <100)
Oxidant: NEGATIVE ug/mL (ref <200)
Temazepam: NEGATIVE ng/mL (ref <50)
pH: 5.77 (ref 4.5–9.0)

## 2017-11-28 ENCOUNTER — Ambulatory Visit (INDEPENDENT_AMBULATORY_CARE_PROVIDER_SITE_OTHER): Payer: Medicare Other | Admitting: Internal Medicine

## 2017-11-28 ENCOUNTER — Encounter: Payer: Self-pay | Admitting: Internal Medicine

## 2017-11-28 VITALS — BP 120/80 | HR 91 | Temp 97.4°F | Ht 60.0 in | Wt 195.0 lb

## 2017-11-28 DIAGNOSIS — G8929 Other chronic pain: Secondary | ICD-10-CM | POA: Diagnosis not present

## 2017-11-28 DIAGNOSIS — M4120 Other idiopathic scoliosis, site unspecified: Secondary | ICD-10-CM

## 2017-11-28 DIAGNOSIS — Z0289 Encounter for other administrative examinations: Secondary | ICD-10-CM

## 2017-11-28 DIAGNOSIS — F1120 Opioid dependence, uncomplicated: Secondary | ICD-10-CM | POA: Diagnosis not present

## 2017-11-28 MED ORDER — HYDROCODONE-ACETAMINOPHEN 10-325 MG PO TABS: 1.0000 | ORAL_TABLET | ORAL | 0 refills | Status: DC | PRN

## 2017-11-28 NOTE — Patient Instructions (Signed)
We have sent in the refill of hydrocodone today and will see you back in 3 months for a pain management visit.

## 2017-11-28 NOTE — Progress Notes (Signed)
   Subjective:    Patient ID: [name], female    DOB: 09-Oct-1986, 31 y.o.   MRN: [id number]  HPI The patient is a 31 YO female coming in for several concerns including chronic pain management (taking norco 180 per month for chronic neck and back pain due to back scoliosis, some inappropriate drug screen in the past), narcotic dependence (some mild constipation, taking norco 10/325, feels that this makes her life more manageable and able to achieve her goals, she is on disability since 2012), and contract (she has not updated pain contract for some time, needs to be updated since our policy changes). Her pain is located in her back and hip (due to inborn scoliosis, has not gotten relief with other agents, has taken extra medication about 3-5 days per month, denies buying medication on the street or from another person).  Review of Systems  Constitutional: Positive for activity change. Negative for appetite change, chills, fatigue, fever and unexpected weight change.  Respiratory: Negative.   Cardiovascular: Negative.   Gastrointestinal: Negative.   Musculoskeletal: Positive for arthralgias, back pain and myalgias. Negative for gait problem and joint swelling.  Skin: Negative.   Neurological: Negative.       Objective:   Physical Exam  Constitutional: She is oriented to person, place, and time. She appears well-developed and well-nourished.  HENT:  Head: Normocephalic and atraumatic.  Eyes: EOM are normal.  Neck: Normal range of motion.  Cardiovascular: Normal rate and regular rhythm.  Pulmonary/Chest: Effort normal and breath sounds normal. No respiratory distress. She has no wheezes. She has no rales.  Abdominal: Soft.  Musculoskeletal: She exhibits tenderness. She exhibits no edema.  Neurological: She is alert and oriented to person, place, and time. Coordination normal.  Skin: Skin is warm and dry.   Vitals:   11/28/17 1044  BP: 120/80  Pulse: 91  Temp: (!) 97.4 F  (36.3 C)  TempSrc: Oral  SpO2: 97%  Weight: 195 lb (88.5 kg)  Height: 5' (1.524 m)      Assessment & Plan:

## 2017-11-29 DIAGNOSIS — Z9114 Patient's other noncompliance with medication regimen: Secondary | ICD-10-CM | POA: Insufficient documentation

## 2017-11-29 DIAGNOSIS — G8929 Other chronic pain: Secondary | ICD-10-CM | POA: Insufficient documentation

## 2017-11-29 NOTE — Assessment & Plan Note (Signed)
She is currently maintained on hydrocodone 10/325 #180 per month. She has had at least one negative drug screen in the past without metabolite. ORT is 2 which is low risk. Counseled today and advised that if she has negative drug screen that she may have to stop or see pain management.

## 2017-11-29 NOTE — Assessment & Plan Note (Signed)
The cause of her low back and neck pain, she has seen ortho in the past but not in some time, denies wanting to see them back as they have not been able to help her in the past, taking norco #180 per month and recent UDS appropriate but with negative in the past so will need close monitoring.

## 2017-11-29 NOTE — Assessment & Plan Note (Signed)
She has mild constipation side effect for which she takes otc medications. She takes norco 10/325. [place] database reviewed and no red flags. Pain management contract signed, talked to her about proper disposal and management. Advised that she needs every 3 month follow up due to our new policies.

## 2017-12-19 ENCOUNTER — Other Ambulatory Visit: Payer: Self-pay | Admitting: Internal Medicine

## 2017-12-21 NOTE — Telephone Encounter (Signed)
[place] Controlled Substance Database checked. Last filled on 11/19/17. Last OV 11/28/17. Please advise in [facility].

## 2017-12-26 ENCOUNTER — Telehealth: Payer: Self-pay | Admitting: Internal Medicine

## 2017-12-26 NOTE — Telephone Encounter (Signed)
Copied from CRM [id number]. Topic: Quick Communication - See Telephone Encounter >> Dec 26, 2017 10:16 AM Windy KalataMichael, Telisha Zawadzki L, NT wrote: CRM for notification. See Telephone encounter for:  12/26/17.  Patient has already contacted her pharmacy they advised her to call the office. Pt is requesting a refill on hydrocodone-acetaminophy . Please advise. [facility] [address]

## 2017-12-26 NOTE — Telephone Encounter (Signed)
Check [place] registry last filled 11/28/2017 will hold until MD return back tomorrow for renewal.../lmb

## 2017-12-27 MED ORDER — HYDROCODONE-ACETAMINOPHEN 10-325 MG PO TABS: 1.0000 | ORAL_TABLET | ORAL | 0 refills | Status: DC | PRN

## 2017-12-27 NOTE — Telephone Encounter (Signed)
Refill sent in

## 2017-12-27 NOTE — Telephone Encounter (Signed)
Tried calling pt several x's line keep being busy. [facility].../[facility]

## 2018-01-08 ENCOUNTER — Other Ambulatory Visit: Payer: Self-pay | Admitting: Internal Medicine

## 2018-01-15 ENCOUNTER — Telehealth: Payer: Self-pay | Admitting: Internal Medicine

## 2018-01-15 NOTE — Telephone Encounter (Signed)
Xanax refill. Last filled 12/21/17 with 60 tabs.

## 2018-01-15 NOTE — Telephone Encounter (Signed)
Not due for refill just yet. She should not be out of medication.

## 2018-01-15 NOTE — Telephone Encounter (Signed)
Copied from CRM #[id number]. Topic: Quick Communication - Rx Refill/Question >> Jan 15, 2018 12:00 PM Landry MellowFoltz, Melissa J wrote: Medication: alprazolam    Has the patient contacted their pharmacy? Yes.     (Agent: If no, request that the patient contact the pharmacy for the refill.)   [facility] Pharm[facility] (with phone number [facility] [address] - saying that authorization is needed, pt thinks this is from the insurance but is not sure. Cb# [phone]   Agent: Please be advised that RX refills may take up to 3 business days. We ask that you follow-up with your pharmacy.

## 2018-01-24 ENCOUNTER — Other Ambulatory Visit: Payer: Self-pay | Admitting: Internal Medicine

## 2018-01-24 NOTE — Telephone Encounter (Signed)
Copied from CRM [id number]. Topic: Quick Communication - See Telephone Encounter >> Jan 24, 2018  3:50 PM Trula SladeWalter, Linda F wrote: CRM for notification. See Telephone encounter for:  01/24/18. Patient would like a refill on his HYDROcodone-acetaminophen (NORCO) 10-325 MG tablet medication and have it [facility] [address]

## 2018-01-25 MED ORDER — HYDROCODONE-ACETAMINOPHEN 10-325 MG PO TABS: 1.0000 | ORAL_TABLET | ORAL | 0 refills | Status: DC | PRN

## 2018-02-26 ENCOUNTER — Ambulatory Visit: Payer: Medicare Other | Admitting: Internal Medicine

## 2018-02-26 ENCOUNTER — Telehealth: Payer: Self-pay | Admitting: Internal Medicine

## 2018-02-26 NOTE — Telephone Encounter (Signed)
error 

## 2018-02-27 ENCOUNTER — Encounter: Payer: Self-pay | Admitting: Internal Medicine

## 2018-02-27 ENCOUNTER — Ambulatory Visit (INDEPENDENT_AMBULATORY_CARE_PROVIDER_SITE_OTHER): Payer: Medicare Other | Admitting: Internal Medicine

## 2018-02-27 ENCOUNTER — Other Ambulatory Visit (INDEPENDENT_AMBULATORY_CARE_PROVIDER_SITE_OTHER): Payer: Medicare Other

## 2018-02-27 VITALS — BP 130/70 | HR 89 | Temp 98.0°F | Ht 60.0 in | Wt 190.0 lb

## 2018-02-27 DIAGNOSIS — I2729 Other secondary pulmonary hypertension: Secondary | ICD-10-CM | POA: Diagnosis not present

## 2018-02-27 DIAGNOSIS — IMO0002 Reserved for concepts with insufficient information to code with codable children: Secondary | ICD-10-CM

## 2018-02-27 DIAGNOSIS — E118 Type 2 diabetes mellitus with unspecified complications: Secondary | ICD-10-CM | POA: Diagnosis not present

## 2018-02-27 DIAGNOSIS — G8929 Other chronic pain: Secondary | ICD-10-CM | POA: Diagnosis not present

## 2018-02-27 DIAGNOSIS — E559 Vitamin D deficiency, unspecified: Secondary | ICD-10-CM

## 2018-02-27 DIAGNOSIS — E1165 Type 2 diabetes mellitus with hyperglycemia: Secondary | ICD-10-CM

## 2018-02-27 DIAGNOSIS — I50810 Right heart failure, unspecified: Secondary | ICD-10-CM

## 2018-02-27 DIAGNOSIS — Z8774 Personal history of (corrected) congenital malformations of heart and circulatory system: Secondary | ICD-10-CM

## 2018-02-27 DIAGNOSIS — F1120 Opioid dependence, uncomplicated: Secondary | ICD-10-CM | POA: Diagnosis not present

## 2018-02-27 DIAGNOSIS — R0602 Shortness of breath: Secondary | ICD-10-CM

## 2018-02-27 LAB — COMPREHENSIVE METABOLIC PANEL
ALK PHOS: 99 U/L (ref 39–117)
ALT: 83 U/L — ABNORMAL HIGH (ref 0–35)
AST: 38 U/L — ABNORMAL HIGH (ref 0–37)
Albumin: 4.3 g/dL (ref 3.5–5.2)
BUN: 17 mg/dL (ref 6–23)
CHLORIDE: 97 meq/L (ref 96–112)
CO2: 33 mEq/L — ABNORMAL HIGH (ref 19–32)
Calcium: 9.8 mg/dL (ref 8.4–10.5)
Creatinine, Ser: 0.71 mg/dL (ref 0.40–1.20)
GFR: 100.52 mL/min (ref >60.00)
GLUCOSE: 174 mg/dL — AB (ref 70–99)
POTASSIUM: 4.3 meq/L (ref 3.5–5.1)
SODIUM: 139 meq/L (ref 135–145)
TOTAL PROTEIN: 7.6 g/dL (ref 6.0–8.3)
Total Bilirubin: 0.4 mg/dL (ref 0.2–1.2)

## 2018-02-27 LAB — VITAMIN D 25 HYDROXY (VIT D DEFICIENCY, FRACTURES): VITD: 12.77 ng/mL — AB (ref 30.00–100.00)

## 2018-02-27 LAB — CBC
HEMATOCRIT: 39.3 % (ref 36.0–46.0)
Hemoglobin: 12.8 g/dL (ref 12.0–15.0)
MCHC: 32.7 g/dL (ref 30.0–36.0)
MCV: 87.8 fl (ref 78.0–100.0)
Platelets: 246 10*3/uL (ref 150.0–400.0)
RBC: 4.47 Mil/uL (ref 3.87–5.11)
RDW: 15 % (ref 11.5–15.5)
WBC: 8 10*3/uL (ref 4.0–10.5)

## 2018-02-27 LAB — BRAIN NATRIURETIC PEPTIDE: Pro B Natriuretic peptide (BNP): 247 pg/mL — ABNORMAL HIGH (ref 0.0–100.0)

## 2018-02-27 LAB — HEMOGLOBIN A1C: Hgb A1c MFr Bld: 8.8 % — ABNORMAL HIGH (ref 4.6–6.5)

## 2018-02-27 LAB — TSH: TSH: 4.02 u[IU]/mL (ref 0.35–4.50)

## 2018-02-27 MED ORDER — HYDROCODONE-ACETAMINOPHEN 10-325 MG PO TABS: 1.0000 | ORAL_TABLET | ORAL | 0 refills | Status: DC | PRN

## 2018-02-27 NOTE — Progress Notes (Signed)
   Subjective:    Patient ID: [name], female    DOB: [birth year], 32 y.o.   MRN: [id number]  HPI The patient is a 32 y.o. female coming in for chronic pain management/back pain/narcotic dependence. The patient is taking hydrocodone 6 time(s) per day. They take it for neck and back pain. This medication allows them to do the following ADLs: walking, cooking, cleaning. They also see no one for this. They deny taking too much medication in the last 3 months. They deny obtaining from another source in the last 3 months. She is concerned that the pharmacy will not fill her xanax since November for some reason and she is not doing well without it. She does stretching and otc pain meds and ice/heat on the back as well. She denies falls or injury to the back recently.  Although she is here for pain management we also need to discuss her diabetes (taking her metformin, more numbness in her feet and legs in the last several months, has not followed up in about 6 months, denies low sugars, denies vision changes), and her pulmonary hypertension/congenital heart disease (she had seek [facility] Dec 2017 and they advised doing cath and echo and possibly she would need valve replacement, she is more SOB lately, no follow up since that time, she admits she did not want to do that and hear bad news, transportation is a problem, denies chest pains, uses oxygen all the time at home).   Review of Systems  Constitutional: Positive for activity change. Negative for appetite change, chills, fatigue, fever and unexpected weight change.  HENT: Negative.   Eyes: Negative.   Respiratory: Positive for shortness of breath. Negative for cough.   Cardiovascular: Negative.   Gastrointestinal: Negative.   Musculoskeletal: Positive for arthralgias, back pain and myalgias. Negative for gait problem and joint swelling.  Skin: Negative.   Neurological: Negative.   Psychiatric/Behavioral: The patient is nervous/anxious.         Objective:   Physical Exam  Constitutional: She is oriented to person, place, and time. She appears well-developed and well-nourished.  Smells of urine  HENT:  Head: Normocephalic and atraumatic.  Eyes: EOM are normal.  Neck: Normal range of motion.  Cardiovascular: Normal rate and regular rhythm.  Pulmonary/Chest: Effort normal and breath sounds normal. No respiratory distress. She has no wheezes. She has no rales.  On oxygen  Abdominal: Soft. Bowel sounds are normal. She exhibits no distension. There is no tenderness. There is no rebound.  Musculoskeletal: She exhibits tenderness. She exhibits no edema.  Neurological: She is alert and oriented to person, place, and time. Coordination normal.  Skin: Skin is warm and dry.  Psychiatric: She has a normal mood and affect.   Vitals:   02/27/18 1013  BP: 130/70  Pulse: 89  Temp: 98 F (36.7 C)  TempSrc: Oral  SpO2: 94%  Weight: 190 lb (86.2 kg)  Height: 5' (1.524 m)      Assessment & Plan:

## 2018-02-27 NOTE — Patient Instructions (Signed)
We are checking the labs today as the diabetes was not doing well last time.   You need to call and schedule a visit with the [facility] before our next visit. It is really important to take care of your heart and we cannot take good care of you if you are not willing to take care of you.  We have refilled the medicine today and will call the pharmacy.    Diabetes Mellitus and Standards of Medical Care Managing diabetes (diabetes mellitus) can be complicated. Your diabetes treatment may be managed by a team of health care providers, including:  A diet and nutrition specialist (registered dietitian).  A nurse.  A certified diabetes educator (CDE).  A diabetes specialist (endocrinologist).  An eye doctor.  A primary care provider.  A dentist.  Your health care providers follow a schedule in order to help you get the best quality of care. The following schedule is a general guideline for your diabetes management plan. Your health care providers may also give you more specific instructions. HbA1c ( hemoglobin A1c) test This test provides information about blood sugar (glucose) control over the previous 2-3 months. It is used to check whether your diabetes management plan needs to be adjusted.  If you are meeting your treatment goals, this test is done at least 2 times a year.  If you are not meeting treatment goals or if your treatment goals have changed, this test is done 4 times a year.  Blood pressure test  This test is done at every routine medical visit. For most people, the goal is less than 130/80. Ask your health care provider what your goal blood pressure should be. Dental and eye exams  Visit your dentist two times a year.  If you have type 1 diabetes, get an eye exam 3-5 years after you are diagnosed, and then once a year after your first exam. ? If you were diagnosed with type 1 diabetes as a child, get an eye exam when you are age 21 or older and have had  diabetes for 3-5 years. After the first exam, you should get an eye exam once a year.  If you have type 2 diabetes, have an eye exam as soon as you are diagnosed, and then once a year after your first exam. Foot care exam  Visual foot exams are done at every routine medical visit. The exams check for cuts, bruises, redness, blisters, sores, or other problems with the feet.  A complete foot exam is done by your health care provider once a year. This exam includes an inspection of the structure and skin of your feet, and a check of the pulses and sensation in your feet. ? Type 1 diabetes: Get your first exam 3-5 years after diagnosis. ? Type 2 diabetes: Get your first exam as soon as you are diagnosed.  Check your feet every day for cuts, bruises, redness, blisters, or sores. If you have any of these or other problems that are not healing, contact your health care provider. Kidney function test ( urine microalbumin)  This test is done once a year. ? Type 1 diabetes: Get your first test 5 years after diagnosis. ? Type 2 diabetes: Get your first test as soon as you are diagnosed.  If you have chronic kidney disease (CKD), get a serum creatinine and estimated glomerular filtration rate (eGFR) test once a year. Lipid profile (cholesterol, HDL, LDL, triglycerides)  This test should be done when you are  diagnosed with diabetes, and every 5 years after the first test. If you are on medicines to lower your cholesterol, you may need to get this test done every year. ? The goal for LDL is less than 100 mg/dL (5.5 mmol/L). If you are at high risk, the goal is less than 70 mg/dL (3.9 mmol/L). ? The goal for HDL is 40 mg/dL (2.2 mmol/L) for men and 50 mg/dL(2.8 mmol/L) for women. An HDL cholesterol of 60 mg/dL (3.3 mmol/L) or higher gives some protection against heart disease. ? The goal for triglycerides is less than 150 mg/dL (8.3 mmol/L). Immunizations  The yearly flu (influenza) vaccine is  recommended for everyone 6 months or older who has diabetes.  The pneumonia (pneumococcal) vaccine is recommended for everyone 2 years or older who has diabetes. If you are 38 or older, you may get the pneumonia vaccine as a series of two separate shots.  The hepatitis B vaccine is recommended for adults shortly after they have been diagnosed with diabetes.  The Tdap (tetanus, diphtheria, and pertussis) vaccine should be given: ? According to normal childhood vaccination schedules, for children. ? Every 10 years, for adults who have diabetes.  The shingles vaccine is recommended for people who have had chicken pox and are 50 years or older. Mental and emotional health  Screening for symptoms of eating disorders, anxiety, and depression is recommended at the time of diagnosis and afterward as needed. If your screening shows that you have symptoms (you have a positive screening result), you may need further evaluation and be referred to a mental health care provider. Diabetes self-management education  Education about how to manage your diabetes is recommended at diagnosis and ongoing as needed. Treatment plan  Your treatment plan will be reviewed at every medical visit. Summary  Managing diabetes (diabetes mellitus) can be complicated. Your diabetes treatment may be managed by a team of health care providers.  Your health care providers follow a schedule in order to help you get the best quality of care.  Standards of care including having regular physical exams, blood tests, blood pressure monitoring, immunizations, screening tests, and education about how to manage your diabetes.  Your health care providers may also give you more specific instructions based on your individual health. This information is not intended to replace advice given to you by your health care provider. Make sure you discuss any questions you have with your health care provider. Document Released: 10/08/2009  Document Revised: 09/08/2016 Document Reviewed: 09/08/2016 Elsevier Interactive Patient Education  [employer].

## 2018-03-02 NOTE — Assessment & Plan Note (Signed)
She has not returned as recommended for follow up and it was recommended to have cath and possible need for surgery in the near future depending on the findings. She was reminded about the importance of her heart and health and that if she is not willing to take care of her health we cannot adequately take care of her.

## 2018-03-02 NOTE — Assessment & Plan Note (Signed)
Checking HgA1c and adjust her regimen of metformin if needed. Previous above goal and talked to her about the seriousness of taking care of her health as she already has neuropathy.

## 2018-03-02 NOTE — Assessment & Plan Note (Signed)
Checking BNP as I suspect worsening lately. No follow up in almost 1.5 years. She knows that she needs to go back for follow up. It is unclear to me how she continues to get her medication or if she is taking it although she insists that she is taking it.

## 2018-03-02 NOTE — Assessment & Plan Note (Signed)
Taking hydrocodone for back pain related to scoliosis. She has had inappropriate UDS in the past but most recent is appropriate. [place] database reviewed and no red flags. Contract 12/18. This is still helping her to function and do ADLs.

## 2018-03-02 NOTE — Assessment & Plan Note (Signed)
Currently on hydrocodone 10/325 #180 per month, ORT 2, contract 12/18 signed. Most recent UDS appropriate but has had inappropriate in the past and needs at least twice a year.

## 2018-03-05 ENCOUNTER — Ambulatory Visit: Payer: Medicare Other | Admitting: Internal Medicine

## 2018-03-27 ENCOUNTER — Other Ambulatory Visit: Payer: Self-pay | Admitting: Internal Medicine

## 2018-03-27 NOTE — Telephone Encounter (Signed)
Copied from CRM [phone]. Topic: Quick Communication - Rx Refill/Question >> Mar 27, 2018  3:58 PM Rudi CocoLathan, Sylena Lotter M, NT wrote: Medication: HYDROcodone-acetaminophen (NORCO) 10-325 MG tablet [[id number]][[id number]]  Has the patient contacted their pharmacy?yes (Agent: If no, request that the patient contact the pharmacy for the refill.) Preferred Pharmacy (with phone number or street name): [id number] [place], [place] - [address] [place] [address] Phone: [phone] Fax: [phone]   Agent: Please be advised that RX refills may take up to 3 business days. We ask that you follow-up with your pharmacy.

## 2018-03-27 NOTE — Telephone Encounter (Signed)
Hydrocodone-acetaminopehn refill Last OV: 02/27/18 Last Refill:02/27/18 #180 tabs no RF [facility] [address] PCP: Dr Okey Duprerawford

## 2018-03-28 ENCOUNTER — Other Ambulatory Visit: Payer: Self-pay | Admitting: Internal Medicine

## 2018-03-28 NOTE — Telephone Encounter (Signed)
Please advise per Dr. Frutoso Chaserawford's Absence. thanks  Control database checked last refill: 02/27/2018  LOV: 02/27/2018

## 2018-03-28 NOTE — Telephone Encounter (Signed)
I opted not to refill this; in reviewing database, she filled the Rx that Dr. Lawerance BachBurns gave her from December 2018 on February 27, 2018. However, in reviewing notes, she called you asking for refill on Xanax on January 15, 2018.

## 2018-03-28 NOTE — Telephone Encounter (Signed)
Additional CRM 03/28/18: Patient has asked that if someone from the office could call her once her HYDROcodone-acetaminophen (NORCO) 10-325 MG tablet has been sent to the pharmacy.     Thank You!!!

## 2018-03-29 ENCOUNTER — Other Ambulatory Visit: Payer: Self-pay

## 2018-03-29 ENCOUNTER — Telehealth: Payer: Self-pay | Admitting: Internal Medicine

## 2018-03-29 MED ORDER — HYDROCODONE-ACETAMINOPHEN 10-325 MG PO TABS: 1.0000 | ORAL_TABLET | ORAL | 0 refills | Status: DC | PRN

## 2018-03-29 NOTE — Telephone Encounter (Signed)
Please advise per Dr. Lawana Chambersrawfords absence. Thank you  Control database checked last refill: 02/27/2018 LOV: 02/27/2018 for pain management

## 2018-03-29 NOTE — Telephone Encounter (Signed)
Patient is requesting call back with update on hydrocodone refill.

## 2018-03-29 NOTE — Telephone Encounter (Signed)
Patient informed another provider sent her medication in for her. Patient stated understanding

## 2018-03-29 NOTE — Telephone Encounter (Signed)
Disregard earlier message routed wrong Rx Please advise per Dr. Lawana Chambersrawfords absence. Thank you  Control database checked last refill: 02/27/2018 LOV: 02/27/2018 for pain management

## 2018-03-29 NOTE — Telephone Encounter (Signed)
Done erx 

## 2018-04-21 ENCOUNTER — Other Ambulatory Visit: Payer: Self-pay | Admitting: Internal Medicine

## 2018-04-25 ENCOUNTER — Other Ambulatory Visit: Payer: Self-pay | Admitting: Internal Medicine

## 2018-04-25 MED ORDER — HYDROCODONE-ACETAMINOPHEN 10-325 MG PO TABS: 1.0000 | ORAL_TABLET | ORAL | 0 refills | Status: DC | PRN

## 2018-04-25 NOTE — Telephone Encounter (Signed)
Copied from CRM [id number]. Topic: Quick Communication - Rx Refill/Question >> Apr 25, 2018 10:30 AM Gerrianne Scale wrote: Medication:    HYDROcodone-acetaminophen (NORCO) 10-325 MG tablet   Has the patient contacted their pharmacy? Yes.   (Agent: If no, request that the patient contact the pharmacy for the refill.) Preferred Pharmacy (with phone number or street name):     [id number], [place] - [address] [phone] (Phone) [phone] (Fax)     Agent: Please be advised that RX refills may take up to 3 business days. We ask that you follow-up with your pharmacy.

## 2018-04-25 NOTE — Telephone Encounter (Signed)
Pt is Hydrocodone-acetaminophen(NORCO) 10-325, last filled on 03/29/18 #180  LOV: 02/27/18 Dr[facility]re  [facility] [address]

## 2018-04-25 NOTE — Telephone Encounter (Signed)
Control database checked last refill: 03/29/2018

## 2018-04-30 ENCOUNTER — Other Ambulatory Visit: Payer: Self-pay | Admitting: Internal Medicine

## 2018-05-01 NOTE — Telephone Encounter (Signed)
Control database checked last refill: 04/01/2018 LOV: 02/27/2018

## 2018-05-22 ENCOUNTER — Other Ambulatory Visit: Payer: Self-pay | Admitting: Internal Medicine

## 2018-05-23 ENCOUNTER — Encounter: Payer: Self-pay | Admitting: Internal Medicine

## 2018-05-23 ENCOUNTER — Other Ambulatory Visit (INDEPENDENT_AMBULATORY_CARE_PROVIDER_SITE_OTHER): Payer: Medicare Other

## 2018-05-23 ENCOUNTER — Other Ambulatory Visit: Payer: Self-pay | Admitting: Internal Medicine

## 2018-05-23 ENCOUNTER — Ambulatory Visit (INDEPENDENT_AMBULATORY_CARE_PROVIDER_SITE_OTHER): Payer: Medicare Other | Admitting: Internal Medicine

## 2018-05-23 VITALS — BP 140/58 | HR 98 | Temp 98.6°F | Ht 60.0 in | Wt 189.0 lb

## 2018-05-23 DIAGNOSIS — R0602 Shortness of breath: Secondary | ICD-10-CM

## 2018-05-23 DIAGNOSIS — E1165 Type 2 diabetes mellitus with hyperglycemia: Secondary | ICD-10-CM

## 2018-05-23 DIAGNOSIS — G8929 Other chronic pain: Secondary | ICD-10-CM

## 2018-05-23 DIAGNOSIS — IMO0002 Reserved for concepts with insufficient information to code with codable children: Secondary | ICD-10-CM

## 2018-05-23 DIAGNOSIS — E118 Type 2 diabetes mellitus with unspecified complications: Secondary | ICD-10-CM

## 2018-05-23 DIAGNOSIS — Z8774 Personal history of (corrected) congenital malformations of heart and circulatory system: Secondary | ICD-10-CM | POA: Diagnosis not present

## 2018-05-23 DIAGNOSIS — M4120 Other idiopathic scoliosis, site unspecified: Secondary | ICD-10-CM

## 2018-05-23 DIAGNOSIS — F1120 Opioid dependence, uncomplicated: Secondary | ICD-10-CM | POA: Diagnosis not present

## 2018-05-23 LAB — HEMOGLOBIN A1C: Hgb A1c MFr Bld: 9.5 % — ABNORMAL HIGH (ref 4.6–6.5)

## 2018-05-23 LAB — BRAIN NATRIURETIC PEPTIDE: Pro B Natriuretic peptide (BNP): 136 pg/mL — ABNORMAL HIGH (ref 0.0–100.0)

## 2018-05-23 MED ORDER — HYDROCODONE-ACETAMINOPHEN 10-325 MG PO TABS: 1.0000 | ORAL_TABLET | ORAL | 0 refills | Status: DC | PRN

## 2018-05-23 MED ORDER — GLIPIZIDE ER 10 MG PO TB24: 10.0000 mg | ORAL_TABLET | Freq: Every day | ORAL | 1 refills | Status: DC

## 2018-05-23 NOTE — Progress Notes (Signed)
   Subjective:    Patient ID: [name], female    DOB: 03-16-1986, 32 y.o.   MRN: [id number]  HPI The patient is a 32 YO female coming in for follow up of her chronic pain (using hydrocodone up to 6 pills per day, rare increased days and she uses less others to compensate, denies injury or overuse, has scoliosis and chronic pain from this), and her back pain (about the same as usual, taking hydrocodone for pain, has seen back specialists in the past without good resolution of her pain) and her congenital heart disease (at last visit we asked her to see her heart specialist before coming back or at least schedule visit, she has done this and visit it for August 1st with Dr. Gala Romney, SOB is improved after increasing lasix at last visit).   [place] narcotic database reviewed and appropriate  Review of Systems  Constitutional: Positive for activity change. Negative for appetite change, chills, fatigue, fever and unexpected weight change.  HENT: Negative.   Eyes: Negative.   Respiratory: Positive for shortness of breath. Negative for cough and chest tightness.   Cardiovascular: Negative.  Negative for chest pain, palpitations and leg swelling.  Gastrointestinal: Negative.  Negative for abdominal distention, abdominal pain, constipation, diarrhea, nausea and vomiting.  Musculoskeletal: Positive for arthralgias, back pain and myalgias. Negative for gait problem and joint swelling.  Skin: Negative.   Neurological: Positive for numbness. Negative for dizziness, tremors, facial asymmetry and weakness.  Psychiatric/Behavioral: Negative.       Objective:   Physical Exam  Constitutional: She is oriented to person, place, and time. She appears well-developed and well-nourished.  HENT:  Head: Normocephalic and atraumatic.  Eyes: EOM are normal.  Neck: Normal range of motion.  Cardiovascular: Normal rate and regular rhythm.  Pulmonary/Chest: Effort normal and breath sounds normal. No  respiratory distress. She has no wheezes. She has no rales.  On oxygen  Abdominal: Soft. Bowel sounds are normal. She exhibits no distension. There is no tenderness. There is no rebound.  Musculoskeletal: She exhibits tenderness. She exhibits no edema.  Pain in back diffuse  Neurological: She is alert and oriented to person, place, and time. Coordination normal.  Skin: Skin is warm and dry.  Psychiatric: She has a normal mood and affect.   Vitals:   05/23/18 1039  BP: (!) 140/58  Pulse: 98  Temp: 98.6 F (37 C)  TempSrc: Oral  SpO2: 94%  Weight: 189 lb (85.7 kg)  Height: 5' (1.524 m)      Assessment & Plan:

## 2018-05-23 NOTE — Patient Instructions (Signed)
We will check the labs today and call you back about the results.    

## 2018-05-23 NOTE — Assessment & Plan Note (Signed)
Taking hydrocodone for pain, denies overuse or misuse. She is aware of the risks and benefits of therapy and wishes to continue. Does not wish to return to neurosurgery at this time.

## 2018-05-23 NOTE — Assessment & Plan Note (Signed)
Contract on file, last UDS appropriate. Is aware of the risks of therapy and has mild constipation which she uses otc for successfully. Gets hydrocodone #180 per month and no early fills. Checked [place] narcotic database.

## 2018-05-23 NOTE — Assessment & Plan Note (Signed)
Checking HgA1c, she has given up sodas since last visit and still taking metformin. Adjust as needed. Reminded about yearly eye exam.

## 2018-05-23 NOTE — Assessment & Plan Note (Signed)
She has scheduled visit with cardiology in town at the heart failure clinic. She is still taking opsumit and lasix. She likely needs cath to re-evaluate.

## 2018-05-23 NOTE — Assessment & Plan Note (Signed)
Hydrocodone 10/325 #180 per month. Has chronic back and neck pain and has had prior evaluation from neurosurgery. [place] narcotic database reviewed without inappropriate fills. She is aware of the risks and benefits of therapy and wishes to continue. Refilled today.

## 2018-06-18 ENCOUNTER — Other Ambulatory Visit: Payer: Self-pay | Admitting: Internal Medicine

## 2018-06-21 ENCOUNTER — Other Ambulatory Visit: Payer: Self-pay | Admitting: Internal Medicine

## 2018-06-21 NOTE — Telephone Encounter (Signed)
Copied from CRM [id number]. Topic: Quick Communication - See Telephone Encounter >> Jun 21, 2018  9:39 AM Tamela OddiMartin, Don'Quashia, NT wrote: CRM for notification. See Telephone encounter for: 06/21/18. Patient called and state she needs a refill of his HYDROcodone-acetaminophen (NORCO) 10-325 MG tablet.  [id number] [place], [place] - [address] [phone] (Phone) [phone] (Fax)

## 2018-06-22 NOTE — Telephone Encounter (Signed)
Hydrocodone refill  Last Refill:05/23/18 #180 Last OV: 05/23/18 PCP: Dr. Okey Duprerawford [facility] [address]

## 2018-06-24 NOTE — Telephone Encounter (Signed)
Check [place] registry last filled 05/26/2018.Marland Kitchen.Raechel Chute/lmb

## 2018-06-25 MED ORDER — HYDROCODONE-ACETAMINOPHEN 10-325 MG PO TABS: 1.0000 | ORAL_TABLET | ORAL | 0 refills | Status: DC | PRN

## 2018-07-17 ENCOUNTER — Other Ambulatory Visit: Payer: Self-pay | Admitting: Internal Medicine

## 2018-07-23 ENCOUNTER — Telehealth: Payer: Self-pay | Admitting: Internal Medicine

## 2018-07-23 DIAGNOSIS — G8929 Other chronic pain: Secondary | ICD-10-CM

## 2018-07-23 NOTE — Telephone Encounter (Signed)
Check [place] registry last filled 06/25/2018../lmb  

## 2018-07-23 NOTE — Addendum Note (Signed)
Addended by: Deatra JamesBRAND, LUCY M on: 07/23/2018 02:06 PM   Modules accepted: Orders

## 2018-07-23 NOTE — Telephone Encounter (Signed)
Notified pt w/MD response. Place order for UDS..Raechel ChutMarland Kitchene/lmb

## 2018-07-23 NOTE — Telephone Encounter (Signed)
Needs UDS place orders, call patient and then once collected re-send for fill.

## 2018-07-23 NOTE — Telephone Encounter (Signed)
Copied from CRM [id number]. Topic: Inquiry >> Jul 23, 2018 10:41 AM Yvonna Alanisobinson, Andra M wrote: Reason for CRM: Patient called requesting a refill of HYDROcodone-acetaminophen (NORCO) 10-325 MG tablet. Patient has contacted her pharmacy. [id number]- [place], [place] - [address] [phone])  [phone] (Fax).        Thank You!!!

## 2018-07-24 ENCOUNTER — Other Ambulatory Visit: Payer: Self-pay | Admitting: Internal Medicine

## 2018-07-24 ENCOUNTER — Other Ambulatory Visit: Payer: Medicare Other

## 2018-07-24 DIAGNOSIS — G8929 Other chronic pain: Secondary | ICD-10-CM

## 2018-07-24 NOTE — Telephone Encounter (Signed)
Please advise per Dr. Okey Duprerawford absence. Thank you   Control database checked last refill: 06/26/2018 LOV: 05/23/2018

## 2018-07-24 NOTE — Telephone Encounter (Signed)
Would you be willing to refill patients hydrocodone in Dr. Lawana Chambersrawfords absence and since they completed UDS per Dr. Okey Duprerawford.

## 2018-07-24 NOTE — Telephone Encounter (Signed)
Patient completed UDS.

## 2018-07-24 NOTE — Telephone Encounter (Signed)
Done erx 

## 2018-07-25 ENCOUNTER — Ambulatory Visit (HOSPITAL_COMMUNITY)
Admission: RE | Admit: 2018-07-25 | Discharge: 2018-07-25 | Disposition: A | Discharge status: Home / Self Care | Payer: Medicare Other | Source: Ambulatory Visit | Attending: Internal Medicine | Admitting: Internal Medicine

## 2018-07-25 ENCOUNTER — Other Ambulatory Visit: Payer: Self-pay | Admitting: Family

## 2018-07-25 VITALS — BP 153/60 | HR 92 | Wt 177.8 lb

## 2018-07-25 DIAGNOSIS — F419 Anxiety disorder, unspecified: Secondary | ICD-10-CM | POA: Diagnosis not present

## 2018-07-25 DIAGNOSIS — Z79899 Other long term (current) drug therapy: Secondary | ICD-10-CM | POA: Insufficient documentation

## 2018-07-25 DIAGNOSIS — Q213 Tetralogy of Fallot: Secondary | ICD-10-CM

## 2018-07-25 DIAGNOSIS — J9610 Chronic respiratory failure, unspecified whether with hypoxia or hypercapnia: Secondary | ICD-10-CM | POA: Diagnosis not present

## 2018-07-25 DIAGNOSIS — Q249 Congenital malformation of heart, unspecified: Secondary | ICD-10-CM | POA: Diagnosis not present

## 2018-07-25 DIAGNOSIS — Z7984 Long term (current) use of oral hypoglycemic drugs: Secondary | ICD-10-CM | POA: Insufficient documentation

## 2018-07-25 DIAGNOSIS — Z8249 Family history of ischemic heart disease and other diseases of the circulatory system: Secondary | ICD-10-CM | POA: Insufficient documentation

## 2018-07-25 DIAGNOSIS — I272 Pulmonary hypertension, unspecified: Secondary | ICD-10-CM | POA: Insufficient documentation

## 2018-07-25 DIAGNOSIS — J984 Other disorders of lung: Secondary | ICD-10-CM | POA: Diagnosis not present

## 2018-07-25 DIAGNOSIS — I2721 Secondary pulmonary arterial hypertension: Secondary | ICD-10-CM | POA: Diagnosis not present

## 2018-07-25 DIAGNOSIS — K219 Gastro-esophageal reflux disease without esophagitis: Secondary | ICD-10-CM | POA: Diagnosis not present

## 2018-07-25 DIAGNOSIS — M419 Scoliosis, unspecified: Secondary | ICD-10-CM | POA: Diagnosis not present

## 2018-07-25 DIAGNOSIS — Z87891 Personal history of nicotine dependence: Secondary | ICD-10-CM | POA: Diagnosis not present

## 2018-07-25 DIAGNOSIS — E119 Type 2 diabetes mellitus without complications: Secondary | ICD-10-CM | POA: Insufficient documentation

## 2018-07-25 DIAGNOSIS — I371 Nonrheumatic pulmonary valve insufficiency: Secondary | ICD-10-CM | POA: Diagnosis not present

## 2018-07-25 DIAGNOSIS — Z833 Family history of diabetes mellitus: Secondary | ICD-10-CM | POA: Insufficient documentation

## 2018-07-25 MED ORDER — HYDROCODONE-ACETAMINOPHEN 10-325 MG PO TABS: 1.0000 | ORAL_TABLET | ORAL | 0 refills | Status: DC | PRN

## 2018-07-25 NOTE — Telephone Encounter (Signed)
I spoke with Berton LanBriana Gulch, Dr. Frutoso Chaserawford's MA; per Dr. Frutoso Chaserawford's policy, okay to refill Hydrocodone as long as patient submitted UDS;  Rx to be updated;

## 2018-07-25 NOTE — Telephone Encounter (Signed)
Patient is calling checking the status of the refill for hydrocodone. Patient did UDS yesterday and was told it would be sent it after she completed that.

## 2018-07-25 NOTE — Telephone Encounter (Signed)
MD is out of the practice till Tuesday had to send refill to another provider and I am waiting on a response

## 2018-07-25 NOTE — Progress Notes (Signed)
ADVANCED HF CLINIC NOTE   HPI:  [name] is a 32 y.o. female with a history of congenital heart disease and pulmonary HTN.   . Tetralogy of Fallot s/p repair  . Absent left pulmonary artery, congenital  . Pulmonic regurgitation  . 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 Scoliosis  . DM2 . Restrictive lung disease    Cardiac cath at [facility] in 2013. Findings: 1. Isolation of the LPA with no antegrade flow into the LPA 2. No large AP collaterals identified to the left lung 3. Tiny network of collaterals off of the LIMA that fill the distal left lung but proximal LPA not identified 4. Severe pulmonary insufficiency 5. No significant RVOT or RPA gradient 6. Elevated distal RPA pressure (systolic pressure 50 and mean 26mmHg) 7. Low cardiac index 8. Elevated RVEDP at and mildly elevated LVEDP at 9. Elevated PVR at baseline on room air (4.84 Wood Units; 8.3 Wood units x M2) 10. Very mild improvement in mean PA pressure on 100% FiO2 and no further change on iNO; Mild improvement in PVR on 100% FiO2 11. PFO - probe patent   We saw her back in 2017 and referred to Dr. Deatra James. Who referred her to Drs. Marlane Mingle and Nash Dimmer Ward at [facility] but hasn't seen either of them since 2014. Continues to take Macitentan. Was previously on Adcirca but she says Dr. Monia Pouch stopped that. Was scheduled for f/u RHC with Dr. Deatra James to measure pressures and consider percutaneous pulmonic valve replacement but was lost to f/u.   She presents today for follow up.Says She was last seen 10/2016. Says she is doing well. Wears O2 at 3L with rest up to 6L with exertion. Active around the house. Denies edema, palpitations, syncope, CP or SOB. Has not followed up at [facility].   Last Echo 11/2016 Normal LV size and function, Mod/Sev RV dilation with mildly reduced systolic function, severe RVH. Mild TR, Severe PI, estimated RVSP at least 59 mmHg.  -  Tetralogy of Farlot with isolated left pulmonary artery. S/p right BT shunt and complete repair  Review of systems complete and found to be negative unless listed in HPI.    Past Medical History:  Diagnosis Date  . Anxiety   . Asthma    As a child  . Back pain   . GERD (gastroesophageal reflux disease)   . Heart failure (HCC)   . Pulmonary hypertension (HCC)   . Scoliosis deformity of spine   . Tetralogy of Fallot     Current Outpatient Medications  Medication Sig Dispense Refill  . albuterol (PROVENTIL HFA;VENTOLIN HFA) 108 (90 Base) MCG/ACT inhaler Inhale 2 puffs into the lungs every 2 (two) hours as needed for wheezing or shortness of breath (cough). 1 Inhaler 0  . ALPRAZolam (XANAX) 0.25 MG tablet TAKE 1 OR 2 TABLETS BY MOUTH EVERY DAY AS NEEDED 60 tablet 0  . CRYSELLE-28 0.3-30 MG-MCG tablet TAKE 1 TABLET BY MOUTH EVERY DAY 84 tablet 3  . furosemide (LASIX) 20 MG tablet Take 40 mg by mouth daily. Take 40 mg by mouth daily.    Marland Kitchen glipiZIDE (GLUCOTROL XL) 10 MG 24 hr tablet Take 1 tablet (10 mg total) by mouth daily with breakfast. 90 tablet 1  . HYDROcodone-acetaminophen (NORCO) 10-325 MG tablet Take 1 tablet by mouth every 4 (four) hours as needed. 180 tablet 0  . Macitentan (OPSUMIT) 10 MG TABS Take 10 mg by mouth daily.    Marland Kitchen  metFORMIN (GLUCOPHAGE) 500 MG tablet TAKE 1 TABLET (500 MG TOTAL) BY MOUTH 2 (TWO) TIMES DAILY WITH A MEAL. 180 tablet 1  . OXYGEN Inhale 3-6 L into the lungs continuous. Uses 3L at rest, up to 6L upon exertion    . pantoprazole (PROTONIX) 40 MG tablet TAKE 1 TABLET (40 MG TOTAL) BY MOUTH DAILY. 90 tablet 1  . potassium chloride (K-DUR) 10 MEQ tablet Take 1 tablet (10 mEq total) by mouth daily. 30 tablet 5  . sertraline (ZOLOFT) 100 MG tablet TAKE 2 TABLETS (200 MG TOTAL) BY MOUTH DAILY. 180 tablet 3  . VENTOLIN HFA 108 (90 Base) MCG/ACT inhaler INHALE 2 PUFFS INTO THE LUNGS EVERY 6 HOURS AS NEEDED FOR WHEEZING OR SHORTNESS OF BREATH 18 Inhaler 0  . VENTOLIN  HFA 108 (90 Base) MCG/ACT inhaler INHALE 2 PUFFS INTO THE LUNGS EVERY 6 HOURS AS NEEDED FOR WHEEZING OR SHORTNESS OF BREATH 18 Inhaler 2   No current facility-administered medications for this encounter.     Allergies  Allergen Reactions  . Avelox [Moxifloxacin Hcl In Nacl] Hives and Itching      Social History   Socioeconomic History  . Marital status: Single    Spouse name: Not on file  . Number of children: 0  . Years of education: Not on file  . Highest education level: Not on file  Occupational History  . Not on file  Social Needs  . Financial resource strain: Not on file  . Food insecurity:    Worry: Not on file    Inability: Not on file  . Transportation needs:    Medical: Not on file    Non-medical: Not on file  Tobacco Use  . Smoking status: Former Smoker    Packs/day: 1.50    Years: 10.00    Pack years: 15.00    Types: Cigarettes    Last attempt to quit: 02/02/2011    Years since quitting: 7.4  . Smokeless tobacco: Never Used  Substance and Sexual Activity  . Alcohol use: No    Alcohol/week: 0.0 oz  . Drug use: No  . Sexual activity: Not on file  Lifestyle  . Physical activity:    Days per week: Not on file    Minutes per session: Not on file  . Stress: Not on file  Relationships  . Social connections:    Talks on phone: Not on file    Gets together: Not on file    Attends religious service: Not on file    Active member of club or organization: Not on file    Attends meetings of clubs or organizations: Not on file    Relationship status: Not on file  . Intimate partner violence:    Fear of current or ex partner: Not on file    Emotionally abused: Not on file    Physically abused: Not on file    Forced sexual activity: Not on file  Other Topics Concern  . Not on file  Social History Narrative   Unemployed at present.  Worked as Conservation officer, naturecashier at [employer].   [employer], [employer]- a little bit   Single, in a 4 year relationship   No children     Unemployed   No history of abuse      Family History  Problem Relation Age of Onset  . Diabetes Mother   . Hypertension Mother   . Cancer Mother        uterine cancer  . Alcohol abuse Father   .  Cancer Maternal Aunt        Breast Cancer, Lung cancer    There were no vitals filed for this visit.  Wt Readings from Last 3 Encounters:  05/23/18 189 lb (85.7 kg)  02/27/18 190 lb (86.2 kg)  11/28/17 195 lb (88.5 kg)    PHYSICAL EXAM: General:No resp difficulty. HEENT: Normal Neck: Supple. JVP 5-6. Carotids 2+ bilat; no bruits. No thyromegaly or nodule noted. Cor: PMI nondisplaced. RRR, 2/6 SEM LSB. Soft diastolic murmur, prominent P2.  Lungs: CTAB, normal effort. Abdomen: Soft, non-tender, non-distended, no HSM. No bruits or masses. +BS  Extremities: No cyanosis, clubbing, or rash. R and LLE no edema. Diffuse cat scratches over arms and neck Neuro: Alert & orientedx3, cranial nerves grossly intact. moves all 4 extremities w/o difficulty. Affect pleasant   ASSESSMENT & PLAN:  1. Congenital heart disease with Tetralogy of Fallot s/p repair & absent left pulmonary artery, congenital 2. Severe pulmonary valve regurgitation 3. PAH on macitentan 4. DM2 5. Chronic respiratory failure in setting of CHD and scoliosis with restrictive lung disease   Overall stable despite lack of f/u. Given complex congenital heart disease. I have suggested that she resume following with Dr. Deatra James and the [facility] with congential [facility] disease program. Previously scheduled for RHC with possible percutaneous replacement of pulmonic valve. Will help her arrange f/u with Dr. Deatra James. Continue macitentan for PAH   Arvilla Meres, MD  12:29 PM

## 2018-07-25 NOTE — Patient Instructions (Signed)
Will refer to [facility]ard [facility] at [facility]. [facility] [address] [place], [place] [id number] Office: [phone]  Follow up with our office as needed.  Take all medication as prescribed the day of your appointment. Bring all medications with you to your appointment.  Do the following things EVERYDAY: 1) Weigh yourself in the morning before breakfast. Write it down and keep it in a log. 2) Take your medicines as prescribed 3) Eat low salt foods-Limit salt (sodium) to 2000 mg per day.  4) Stay as active as you can everyday 5) Limit all fluids for the day to less than 2 liters

## 2018-07-26 ENCOUNTER — Encounter (HOSPITAL_COMMUNITY): Payer: Self-pay

## 2018-07-26 NOTE — Progress Notes (Signed)
[facility] referral faxed to Dr. Deatra JamesKrasuski. Office scheduler confirmed receipt of this fax and states she will reach out to patient this week to schedule. Patient updated and given office address and phone number and advised to follow up with our office if she does not receive phone call from this office by end of week.  Ave FilterBradley, Kaloni Bisaillon Genevea, RN

## 2018-07-27 LAB — PAIN MGMT, PROFILE 8 W/CONF, U
6 Acetylmorphine: NEGATIVE ng/mL (ref <10)
AMPHETAMINES: NEGATIVE ng/mL (ref <500)
Alcohol Metabolites: NEGATIVE ng/mL (ref <500)
Benzodiazepines: NEGATIVE ng/mL (ref <100)
Buprenorphine, Urine: NEGATIVE ng/mL (ref <5)
CODEINE: NEGATIVE ng/mL (ref <50)
CREATININE: 9.1 mg/dL — AB
Cocaine Metabolite: NEGATIVE ng/mL (ref <150)
Hydrocodone: 106 ng/mL — ABNORMAL HIGH (ref <50)
Hydromorphone: 65 ng/mL — ABNORMAL HIGH (ref <50)
MARIJUANA METABOLITE: NEGATIVE ng/mL (ref <20)
MDMA: NEGATIVE ng/mL (ref <500)
MORPHINE: NEGATIVE ng/mL (ref <50)
Norhydrocodone: 50 ng/mL — ABNORMAL HIGH (ref <50)
Opiates: POSITIVE ng/mL — AB (ref <100)
Oxidant: NEGATIVE ug/mL (ref <200)
Oxycodone: NEGATIVE ng/mL (ref <100)
PH: 6.53 (ref 4.5–9.0)
SPECIFIC GRAVITY: 1.006 (ref >=1.0)

## 2018-07-30 ENCOUNTER — Other Ambulatory Visit: Payer: Self-pay | Admitting: Internal Medicine

## 2018-07-30 ENCOUNTER — Encounter: Payer: Self-pay | Admitting: Internal Medicine

## 2018-07-30 DIAGNOSIS — M419 Scoliosis, unspecified: Secondary | ICD-10-CM

## 2018-07-31 ENCOUNTER — Other Ambulatory Visit: Payer: Self-pay | Admitting: Internal Medicine

## 2018-07-31 MED ORDER — HYDROCODONE-ACETAMINOPHEN 10-325 MG PO TABS: ORAL_TABLET | ORAL | 0 refills | Status: DC

## 2018-08-01 ENCOUNTER — Ambulatory Visit (INDEPENDENT_AMBULATORY_CARE_PROVIDER_SITE_OTHER): Payer: Medicare Other | Admitting: Internal Medicine

## 2018-08-01 ENCOUNTER — Encounter: Payer: Self-pay | Admitting: Internal Medicine

## 2018-08-01 VITALS — BP 150/74 | HR 92 | Temp 98.3°F | Ht 60.0 in | Wt 182.0 lb

## 2018-08-01 DIAGNOSIS — Z9114 Patient's other noncompliance with medication regimen: Secondary | ICD-10-CM | POA: Diagnosis not present

## 2018-08-01 DIAGNOSIS — F1120 Opioid dependence, uncomplicated: Secondary | ICD-10-CM | POA: Diagnosis not present

## 2018-08-01 NOTE — Progress Notes (Signed)
   Subjective:    Patient ID: [name], female    DOB: 1986-04-21, 32 y.o.   MRN: [id number]  HPI The patient is a 32 YO female coming in for wanting more pain medications. She was called and informed in the last several days that due to multiple inappropriate drug screens her contract was violated and terminated. She was given taper dosing and medication to taper to off and pain clinic referral was placed. She is not sure how the drug screen was negative as she took the pain medicine the night before the test.   Review of Systems  Constitutional: Negative.   HENT: Negative.   Eyes: Negative.   Respiratory: Positive for shortness of breath. Negative for cough and chest tightness.   Cardiovascular: Negative for chest pain, palpitations and leg swelling.  Gastrointestinal: Negative for abdominal distention, abdominal pain, constipation, diarrhea, nausea and vomiting.  Musculoskeletal: Positive for arthralgias and back pain.  Skin: Negative.   Neurological: Negative.   Psychiatric/Behavioral: Negative.       Objective:   Physical Exam  Constitutional: She is oriented to person, place, and time. She appears well-developed and well-nourished.  HENT:  Head: Normocephalic and atraumatic.  Eyes: EOM are normal.  Neck: Normal range of motion.  Cardiovascular: Normal rate and regular rhythm.  Pulmonary/Chest: Effort normal. She has no wheezes.  On oxygen  Musculoskeletal: She exhibits no edema.  Neurological: She is alert and oriented to person, place, and time. Coordination normal.  Skin: Skin is warm and dry.   Vitals:   08/01/18 0905  BP: (!) 150/74  Pulse: 92  Temp: 98.3 F (36.8 C)  TempSrc: Oral  SpO2: 95%  Weight: 182 lb (82.6 kg)  Height: 5' (1.524 m)      Assessment & Plan:

## 2018-08-01 NOTE — Assessment & Plan Note (Signed)
She is aware that we no longer have a pain contract with her due to multiple violations. We cannot prescribe her any controlled substances and these have been removed from her medication list so that they will not be inadvertently filled including xanax and hydrocodone.

## 2018-08-01 NOTE — Patient Instructions (Signed)
Starting today she needs to only take 5 pills today for 2 days. Starting Friday she needs to take only 4 pills per day for 2 days. Starting Sunday she needs to take only 3 pills per day for 2 days. Starting next Tuesday she needs to take only 2 pills per day for 2 days. Starting next Thursday she needs to take only 1 pill per day for 2 days then stop. (since she should have still 24 pills left we will send in the remainder which is 6 pills which we have sent in).  We will work on getting you in with the pain clinic which can take some time.

## 2018-08-01 NOTE — Assessment & Plan Note (Signed)
She has violated our contract and is aware and understanding that we will not be able to prescribe any controlled substances for her. She has been referred to pain management and given tapering instructions to minimize risk of withdrawal.

## 2018-08-15 ENCOUNTER — Other Ambulatory Visit: Payer: Self-pay | Admitting: Internal Medicine

## 2018-09-05 ENCOUNTER — Telehealth: Payer: Self-pay

## 2018-09-05 NOTE — Telephone Encounter (Signed)
Copied from CRM [id number]. Topic: Referral - Request >> Sep 05, 2018 11:22 AM Marylen PontoMcneil, Ja-Kwan wrote: Reason for CRM: Pt requests [facility] to [facility] Lucretia [facility]Grossman at [facility] [address] for pain management. Cb# [phone]

## 2018-09-05 NOTE — Telephone Encounter (Signed)
She already has pain management referral route to referrals.

## 2018-09-05 NOTE — Telephone Encounter (Signed)
Referral faxed to [facility] - left vm for pt to call back to inform her

## 2018-09-11 DIAGNOSIS — M129 Arthropathy, unspecified: Secondary | ICD-10-CM | POA: Diagnosis not present

## 2018-09-11 DIAGNOSIS — Z79899 Other long term (current) drug therapy: Secondary | ICD-10-CM | POA: Diagnosis not present

## 2018-09-11 DIAGNOSIS — G8929 Other chronic pain: Secondary | ICD-10-CM | POA: Diagnosis not present

## 2018-09-11 DIAGNOSIS — M545 Low back pain: Secondary | ICD-10-CM | POA: Diagnosis not present

## 2018-09-25 DIAGNOSIS — G894 Chronic pain syndrome: Secondary | ICD-10-CM | POA: Diagnosis not present

## 2018-09-25 DIAGNOSIS — M545 Low back pain: Secondary | ICD-10-CM | POA: Diagnosis not present

## 2018-09-25 DIAGNOSIS — Z79899 Other long term (current) drug therapy: Secondary | ICD-10-CM | POA: Diagnosis not present

## 2018-09-25 DIAGNOSIS — G8929 Other chronic pain: Secondary | ICD-10-CM | POA: Diagnosis not present

## 2018-10-06 ENCOUNTER — Other Ambulatory Visit: Payer: Self-pay | Admitting: Internal Medicine

## 2018-10-12 ENCOUNTER — Other Ambulatory Visit: Payer: Self-pay | Admitting: Internal Medicine

## 2018-10-25 DIAGNOSIS — M545 Low back pain: Secondary | ICD-10-CM | POA: Diagnosis not present

## 2018-10-25 DIAGNOSIS — G894 Chronic pain syndrome: Secondary | ICD-10-CM | POA: Diagnosis not present

## 2018-10-25 DIAGNOSIS — G8929 Other chronic pain: Secondary | ICD-10-CM | POA: Diagnosis not present

## 2018-10-25 DIAGNOSIS — Z79899 Other long term (current) drug therapy: Secondary | ICD-10-CM | POA: Diagnosis not present

## 2018-10-25 DIAGNOSIS — Z23 Encounter for immunization: Secondary | ICD-10-CM | POA: Diagnosis not present

## 2018-11-10 ENCOUNTER — Other Ambulatory Visit: Payer: Self-pay | Admitting: Internal Medicine

## 2018-11-13 DIAGNOSIS — G8929 Other chronic pain: Secondary | ICD-10-CM | POA: Diagnosis not present

## 2018-11-13 DIAGNOSIS — M419 Scoliosis, unspecified: Secondary | ICD-10-CM | POA: Diagnosis not present

## 2018-11-13 DIAGNOSIS — Z79899 Other long term (current) drug therapy: Secondary | ICD-10-CM | POA: Diagnosis not present

## 2018-11-13 DIAGNOSIS — M545 Low back pain: Secondary | ICD-10-CM | POA: Diagnosis not present

## 2018-11-19 DIAGNOSIS — F410 Panic disorder [episodic paroxysmal anxiety] without agoraphobia: Secondary | ICD-10-CM | POA: Diagnosis not present

## 2018-11-19 DIAGNOSIS — Z79891 Long term (current) use of opiate analgesic: Secondary | ICD-10-CM | POA: Diagnosis not present

## 2018-11-19 DIAGNOSIS — F411 Generalized anxiety disorder: Secondary | ICD-10-CM | POA: Diagnosis not present

## 2018-12-13 DIAGNOSIS — Z79899 Other long term (current) drug therapy: Secondary | ICD-10-CM | POA: Diagnosis not present

## 2019-01-02 DIAGNOSIS — F411 Generalized anxiety disorder: Secondary | ICD-10-CM | POA: Diagnosis not present

## 2019-01-16 DIAGNOSIS — Z79899 Other long term (current) drug therapy: Secondary | ICD-10-CM | POA: Diagnosis not present

## 2019-02-20 DIAGNOSIS — Z79899 Other long term (current) drug therapy: Secondary | ICD-10-CM | POA: Diagnosis not present

## 2019-03-02 ENCOUNTER — Other Ambulatory Visit: Payer: Self-pay | Admitting: Internal Medicine

## 2019-03-17 ENCOUNTER — Other Ambulatory Visit: Payer: Self-pay | Admitting: Internal Medicine

## 2019-03-23 ENCOUNTER — Other Ambulatory Visit: Payer: Self-pay | Admitting: Internal Medicine

## 2019-04-20 ENCOUNTER — Other Ambulatory Visit: Payer: Self-pay | Admitting: Internal Medicine

## 2019-04-21 DIAGNOSIS — Z79899 Other long term (current) drug therapy: Secondary | ICD-10-CM | POA: Diagnosis not present

## 2019-05-21 DIAGNOSIS — G894 Chronic pain syndrome: Secondary | ICD-10-CM | POA: Diagnosis not present

## 2019-05-21 DIAGNOSIS — G8929 Other chronic pain: Secondary | ICD-10-CM | POA: Diagnosis not present

## 2019-05-21 DIAGNOSIS — M545 Low back pain: Secondary | ICD-10-CM | POA: Diagnosis not present

## 2019-05-21 DIAGNOSIS — Z79899 Other long term (current) drug therapy: Secondary | ICD-10-CM | POA: Diagnosis not present

## 2019-05-22 ENCOUNTER — Telehealth: Payer: Self-pay | Admitting: Internal Medicine

## 2019-05-22 NOTE — Telephone Encounter (Signed)
Left patient message to call back to schedule.   °

## 2019-05-22 NOTE — Telephone Encounter (Signed)
Can have 1 month supply but needs visit for refills, she has not had appropriate follow up for her medical care

## 2019-05-22 NOTE — Telephone Encounter (Signed)
Can you please make patient an office visit to follow up. Thank you

## 2019-06-17 ENCOUNTER — Other Ambulatory Visit: Payer: Self-pay | Admitting: Internal Medicine

## 2019-06-19 DIAGNOSIS — M545 Low back pain: Secondary | ICD-10-CM | POA: Diagnosis not present

## 2019-06-19 DIAGNOSIS — Z79899 Other long term (current) drug therapy: Secondary | ICD-10-CM | POA: Diagnosis not present

## 2019-06-19 DIAGNOSIS — G8929 Other chronic pain: Secondary | ICD-10-CM | POA: Diagnosis not present

## 2019-06-19 DIAGNOSIS — G894 Chronic pain syndrome: Secondary | ICD-10-CM | POA: Diagnosis not present

## 2019-07-08 ENCOUNTER — Other Ambulatory Visit: Payer: Self-pay | Admitting: Internal Medicine

## 2019-07-21 DIAGNOSIS — G8929 Other chronic pain: Secondary | ICD-10-CM | POA: Diagnosis not present

## 2019-07-21 DIAGNOSIS — G894 Chronic pain syndrome: Secondary | ICD-10-CM | POA: Diagnosis not present

## 2019-07-21 DIAGNOSIS — Z79899 Other long term (current) drug therapy: Secondary | ICD-10-CM | POA: Diagnosis not present

## 2019-07-21 DIAGNOSIS — M545 Low back pain: Secondary | ICD-10-CM | POA: Diagnosis not present

## 2019-07-21 DIAGNOSIS — Z760 Encounter for issue of repeat prescription: Secondary | ICD-10-CM | POA: Diagnosis not present

## 2019-08-04 ENCOUNTER — Ambulatory Visit: Payer: Medicare Other | Admitting: Internal Medicine

## 2019-08-20 DIAGNOSIS — G894 Chronic pain syndrome: Secondary | ICD-10-CM | POA: Diagnosis not present

## 2019-08-20 DIAGNOSIS — Z79899 Other long term (current) drug therapy: Secondary | ICD-10-CM | POA: Diagnosis not present

## 2019-08-20 DIAGNOSIS — Z760 Encounter for issue of repeat prescription: Secondary | ICD-10-CM | POA: Diagnosis not present

## 2019-08-20 DIAGNOSIS — M545 Low back pain: Secondary | ICD-10-CM | POA: Diagnosis not present

## 2019-08-20 DIAGNOSIS — G8929 Other chronic pain: Secondary | ICD-10-CM | POA: Diagnosis not present

## 2019-09-08 ENCOUNTER — Other Ambulatory Visit: Payer: Self-pay | Admitting: Internal Medicine

## 2019-09-17 DIAGNOSIS — G8929 Other chronic pain: Secondary | ICD-10-CM | POA: Diagnosis not present

## 2019-09-17 DIAGNOSIS — Z760 Encounter for issue of repeat prescription: Secondary | ICD-10-CM | POA: Diagnosis not present

## 2019-09-17 DIAGNOSIS — M545 Low back pain: Secondary | ICD-10-CM | POA: Diagnosis not present

## 2019-09-17 DIAGNOSIS — Z23 Encounter for immunization: Secondary | ICD-10-CM | POA: Diagnosis not present

## 2019-09-17 DIAGNOSIS — G894 Chronic pain syndrome: Secondary | ICD-10-CM | POA: Diagnosis not present

## 2019-09-17 DIAGNOSIS — Z79899 Other long term (current) drug therapy: Secondary | ICD-10-CM | POA: Diagnosis not present

## 2019-09-25 ENCOUNTER — Ambulatory Visit: Payer: Medicare Other | Admitting: Internal Medicine

## 2019-09-26 ENCOUNTER — Emergency Department (HOSPITAL_COMMUNITY)
Admission: EM | Admit: 2019-09-26 | Discharge: 2019-09-26 | Disposition: A | Discharge status: Home / Self Care | Payer: Medicare Other | Attending: Emergency Medicine | Admitting: Emergency Medicine

## 2019-09-26 ENCOUNTER — Emergency Department (HOSPITAL_COMMUNITY): Payer: Medicare Other

## 2019-09-26 DIAGNOSIS — R0902 Hypoxemia: Secondary | ICD-10-CM | POA: Diagnosis not present

## 2019-09-26 DIAGNOSIS — R0789 Other chest pain: Secondary | ICD-10-CM | POA: Diagnosis present

## 2019-09-26 DIAGNOSIS — Z5321 Procedure and treatment not carried out due to patient leaving prior to being seen by health care provider: Secondary | ICD-10-CM | POA: Diagnosis not present

## 2019-09-26 DIAGNOSIS — R079 Chest pain, unspecified: Secondary | ICD-10-CM | POA: Diagnosis not present

## 2019-09-26 DIAGNOSIS — E1165 Type 2 diabetes mellitus with hyperglycemia: Secondary | ICD-10-CM | POA: Diagnosis not present

## 2019-09-26 DIAGNOSIS — Z9981 Dependence on supplemental oxygen: Secondary | ICD-10-CM | POA: Insufficient documentation

## 2019-09-26 LAB — BASIC METABOLIC PANEL
Anion gap: 12 (ref 5–15)
BUN: 13 mg/dL (ref 6–20)
CO2: 32 mmol/L (ref 22–32)
Calcium: 9.6 mg/dL (ref 8.9–10.3)
Chloride: 93 mmol/L — ABNORMAL LOW (ref 98–111)
Creatinine, Ser: 0.58 mg/dL (ref 0.44–1.00)
GFR calc Af Amer: >60 mL/min (ref >60)
GFR calc non Af Amer: >60 mL/min (ref >60)
Glucose, Bld: 279 mg/dL — ABNORMAL HIGH (ref 70–99)
Potassium: 4.3 mmol/L (ref 3.5–5.1)
Sodium: 137 mmol/L (ref 135–145)

## 2019-09-26 LAB — CBC
HCT: 43.4 % (ref 36.0–46.0)
Hemoglobin: 14.2 g/dL (ref 12.0–15.0)
MCH: 28 pg (ref 26.0–34.0)
MCHC: 32.7 g/dL (ref 30.0–36.0)
MCV: 85.6 fL (ref 80.0–100.0)
Platelets: 243 10*3/uL (ref 150–400)
RBC: 5.07 MIL/uL (ref 3.87–5.11)
RDW: 13.3 % (ref 11.5–15.5)
WBC: 10.6 10*3/uL — ABNORMAL HIGH (ref 4.0–10.5)
nRBC: 0 % (ref 0.0–0.2)

## 2019-09-26 LAB — I-STAT BETA HCG BLOOD, ED (MC, WL, AP ONLY): I-stat hCG, quantitative: <5 m[IU]/mL (ref <5)

## 2019-09-26 LAB — TROPONIN I (HIGH SENSITIVITY): Troponin I (High Sensitivity): 11 ng/L (ref <18)

## 2019-09-26 NOTE — ED Notes (Signed)
Pt states she can't wait any longer is feeling better. Seen leaving ED with steady gait.

## 2019-09-26 NOTE — ED Triage Notes (Signed)
Patient to ER by [facility] for chest pain onset Wednesday. Hx of Tetrology of Fallot, on continuous oxygen 3L. She received 324 mg aspirin in route. She reports the pain is left sided and radiates into her shoulder. Denies shortness of breath.

## 2019-10-05 ENCOUNTER — Other Ambulatory Visit: Payer: Self-pay | Admitting: Internal Medicine

## 2019-10-13 DIAGNOSIS — G8929 Other chronic pain: Secondary | ICD-10-CM | POA: Diagnosis not present

## 2019-10-13 DIAGNOSIS — M545 Low back pain: Secondary | ICD-10-CM | POA: Diagnosis not present

## 2019-10-13 DIAGNOSIS — Z79899 Other long term (current) drug therapy: Secondary | ICD-10-CM | POA: Diagnosis not present

## 2019-10-13 DIAGNOSIS — G894 Chronic pain syndrome: Secondary | ICD-10-CM | POA: Diagnosis not present

## 2019-11-18 DIAGNOSIS — G894 Chronic pain syndrome: Secondary | ICD-10-CM | POA: Diagnosis not present

## 2019-11-18 DIAGNOSIS — G8929 Other chronic pain: Secondary | ICD-10-CM | POA: Diagnosis not present

## 2019-11-18 DIAGNOSIS — Z79899 Other long term (current) drug therapy: Secondary | ICD-10-CM | POA: Diagnosis not present

## 2019-11-18 DIAGNOSIS — M545 Low back pain: Secondary | ICD-10-CM | POA: Diagnosis not present

## 2019-12-09 DIAGNOSIS — G8929 Other chronic pain: Secondary | ICD-10-CM | POA: Diagnosis not present

## 2019-12-09 DIAGNOSIS — Z79899 Other long term (current) drug therapy: Secondary | ICD-10-CM | POA: Diagnosis not present

## 2019-12-09 DIAGNOSIS — G894 Chronic pain syndrome: Secondary | ICD-10-CM | POA: Diagnosis not present

## 2019-12-09 DIAGNOSIS — M545 Low back pain: Secondary | ICD-10-CM | POA: Diagnosis not present

## 2020-01-30 ENCOUNTER — Other Ambulatory Visit: Payer: Self-pay

## 2020-01-30 ENCOUNTER — Encounter: Payer: Self-pay | Admitting: Internal Medicine

## 2020-01-30 ENCOUNTER — Ambulatory Visit (INDEPENDENT_AMBULATORY_CARE_PROVIDER_SITE_OTHER): Payer: Medicare Other | Admitting: Internal Medicine

## 2020-01-30 VITALS — BP 126/82 | HR 96 | Temp 98.2°F | Ht 60.0 in | Wt 188.0 lb

## 2020-01-30 DIAGNOSIS — F419 Anxiety disorder, unspecified: Secondary | ICD-10-CM | POA: Diagnosis not present

## 2020-01-30 DIAGNOSIS — E1140 Type 2 diabetes mellitus with diabetic neuropathy, unspecified: Secondary | ICD-10-CM

## 2020-01-30 DIAGNOSIS — E1165 Type 2 diabetes mellitus with hyperglycemia: Secondary | ICD-10-CM

## 2020-01-30 DIAGNOSIS — K219 Gastro-esophageal reflux disease without esophagitis: Secondary | ICD-10-CM | POA: Diagnosis not present

## 2020-01-30 DIAGNOSIS — Z3202 Encounter for pregnancy test, result negative: Secondary | ICD-10-CM | POA: Diagnosis not present

## 2020-01-30 DIAGNOSIS — Z8774 Personal history of (corrected) congenital malformations of heart and circulatory system: Secondary | ICD-10-CM

## 2020-01-30 DIAGNOSIS — Z30019 Encounter for initial prescription of contraceptives, unspecified: Secondary | ICD-10-CM | POA: Diagnosis not present

## 2020-01-30 DIAGNOSIS — IMO0002 Reserved for concepts with insufficient information to code with codable children: Secondary | ICD-10-CM

## 2020-01-30 DIAGNOSIS — Z309 Encounter for contraceptive management, unspecified: Secondary | ICD-10-CM | POA: Insufficient documentation

## 2020-01-30 DIAGNOSIS — Z30011 Encounter for initial prescription of contraceptive pills: Secondary | ICD-10-CM

## 2020-01-30 LAB — MICROALBUMIN / CREATININE URINE RATIO
Creatinine,U: 123 mg/dL
Microalb Creat Ratio: 55.4 mg/g — ABNORMAL HIGH (ref 0.0–30.0)
Microalb, Ur: 68.1 mg/dL — ABNORMAL HIGH (ref 0.0–1.9)

## 2020-01-30 LAB — COMPREHENSIVE METABOLIC PANEL
ALT: 44 U/L — ABNORMAL HIGH (ref 0–35)
AST: 28 U/L (ref 0–37)
Albumin: 4.2 g/dL (ref 3.5–5.2)
Alkaline Phosphatase: 123 U/L — ABNORMAL HIGH (ref 39–117)
BUN: 16 mg/dL (ref 6–23)
CO2: 39 mEq/L — ABNORMAL HIGH (ref 19–32)
Calcium: 9.7 mg/dL (ref 8.4–10.5)
Chloride: 94 mEq/L — ABNORMAL LOW (ref 96–112)
Creatinine, Ser: 0.67 mg/dL (ref 0.40–1.20)
GFR: 99.98 mL/min (ref >60.00)
Glucose, Bld: 288 mg/dL — ABNORMAL HIGH (ref 70–99)
Potassium: 4.5 mEq/L (ref 3.5–5.1)
Sodium: 138 mEq/L (ref 135–145)
Total Bilirubin: 0.5 mg/dL (ref 0.2–1.2)
Total Protein: 7.4 g/dL (ref 6.0–8.3)

## 2020-01-30 LAB — CBC
HCT: 39.7 % (ref 36.0–46.0)
Hemoglobin: 12.6 g/dL (ref 12.0–15.0)
MCHC: 31.8 g/dL (ref 30.0–36.0)
MCV: 87.6 fl (ref 78.0–100.0)
Platelets: 237 10*3/uL (ref 150.0–400.0)
RBC: 4.53 Mil/uL (ref 3.87–5.11)
RDW: 14.7 % (ref 11.5–15.5)
WBC: 9.5 10*3/uL (ref 4.0–10.5)

## 2020-01-30 LAB — LIPID PANEL
Cholesterol: 197 mg/dL (ref 0–200)
HDL: 47.2 mg/dL (ref >39.00)
NonHDL: 149.79
Total CHOL/HDL Ratio: 4
Triglycerides: 215 mg/dL — ABNORMAL HIGH (ref 0.0–149.0)
VLDL: 43 mg/dL — ABNORMAL HIGH (ref 0.0–40.0)

## 2020-01-30 LAB — POCT URINE PREGNANCY: Preg Test, Ur: NEGATIVE

## 2020-01-30 LAB — TSH: TSH: 4.52 u[IU]/mL — ABNORMAL HIGH (ref 0.35–4.50)

## 2020-01-30 LAB — LDL CHOLESTEROL, DIRECT: Direct LDL: 116 mg/dL

## 2020-01-30 LAB — HEMOGLOBIN A1C: Hgb A1c MFr Bld: 10 % — ABNORMAL HIGH (ref 4.6–6.5)

## 2020-01-30 MED ORDER — PANTOPRAZOLE SODIUM 40 MG PO TBEC: 40.0000 mg | DELAYED_RELEASE_TABLET | Freq: Every day | ORAL | 3 refills | Status: DC

## 2020-01-30 MED ORDER — CRYSELLE-28 0.3-30 MG-MCG PO TABS: 1.0000 | ORAL_TABLET | Freq: Every day | ORAL | 6 refills | Status: DC

## 2020-01-30 MED ORDER — ALBUTEROL SULFATE HFA 108 (90 BASE) MCG/ACT IN AERS: 1.0000 | INHALATION_SPRAY | RESPIRATORY_TRACT | 3 refills | Status: DC | PRN

## 2020-01-30 MED ORDER — SERTRALINE HCL 100 MG PO TABS: ORAL_TABLET | ORAL | 3 refills | Status: DC

## 2020-01-30 NOTE — Assessment & Plan Note (Signed)
States she is seeing [facility] who has an office in town. Cannot access those records, she does not recall the name of the provider there. States taking opsumit and still on oxygen.

## 2020-01-30 NOTE — Assessment & Plan Note (Signed)
Checking HgA1c, foot exam done. Reminded about eye exam. Taking metformin and glipizide although it is unclear how this is possible when our refills should have ran out 6-12 months ago. Complicated by neuropathy. Adjust as needed after labs can provide refills for 6 months.

## 2020-01-30 NOTE — Assessment & Plan Note (Signed)
Previously on birth control, not taking anything now. LMP 01/20/20. Urine pregnancy test done today which is negative so refilled birth control.

## 2020-01-30 NOTE — Assessment & Plan Note (Signed)
Refill protonix, she has been out for some time with recurrence of symptoms.

## 2020-01-30 NOTE — Progress Notes (Signed)
   Subjective:   Patient ID: [name], female    DOB: February 08, 1986, 34 y.o.   MRN: [id number]  HPI The patient is a 34 YO female coming in for follow up of medical care including diabetes (taking metformin and glipizide, complicated by some neuropathy which is stable, denies checking sugar levels at home, is not sure how she has gotten her medications) and birth control (previously on oral BCP, has not taken in some time due to lack of visit, denies current risk of pregnancy, LMP 01/20/20) and anxiety (not taking zoloft for some time due to no visit, she denies SI/HI, overall worse due to pandemic). She has still been taking only glipizide, metformin and opsumit and her hydrocodone from pain management due to no visit in about 2 years.   PMH, [facility], social history reviewed and updated  Review of Systems  Constitutional: Negative.   HENT: Negative.   Eyes: Negative.   Respiratory: Positive for shortness of breath. Negative for cough and chest tightness.   Cardiovascular: Negative for chest pain, palpitations and leg swelling.  Gastrointestinal: Negative for abdominal distention, abdominal pain, constipation, diarrhea, nausea and vomiting.  Musculoskeletal: Negative.   Skin: Negative.   Neurological: Negative.   Psychiatric/Behavioral: Negative.     Objective:  Physical Exam Constitutional:      Appearance: She is well-developed. She is obese.  HENT:     Head: Normocephalic and atraumatic.  Cardiovascular:     Rate and Rhythm: Normal rate and regular rhythm.     Comments: On oxygen Pulmonary:     Effort: Pulmonary effort is normal. No respiratory distress.     Breath sounds: Normal breath sounds. No wheezing or rales.  Abdominal:     General: Bowel sounds are normal. There is no distension.     Palpations: Abdomen is soft.     Tenderness: There is no abdominal tenderness. There is no rebound.  Musculoskeletal:     Cervical back: Normal range of motion.  Skin:    General:  Skin is warm and dry.  Neurological:     Mental Status: She is alert and oriented to person, place, and time.     Coordination: Coordination normal.     Vitals:   01/30/20 0917  BP: 126/82  Pulse: 96  Temp: 98.2 F (36.8 C)  TempSrc: Oral  SpO2: 98%  Weight: 188 lb (85.3 kg)  Height: 5' (1.524 m)    This visit occurred during the SARS-CoV-2 public health emergency.  Safety protocols were in place, including screening questions prior to the visit, additional usage of staff PPE, and extensive cleaning of exam room while observing appropriate contact time as indicated for disinfecting solutions.   Assessment & Plan:

## 2020-01-30 NOTE — Assessment & Plan Note (Signed)
Refill zoloft 200 mg daily and advised to start with 100 mg daily.

## 2020-01-30 NOTE — Patient Instructions (Signed)
We will check the labs today and the urine.

## 2020-02-03 ENCOUNTER — Ambulatory Visit: Payer: Medicare Other | Admitting: Internal Medicine

## 2020-02-11 ENCOUNTER — Other Ambulatory Visit: Payer: Self-pay | Admitting: Internal Medicine

## 2020-02-11 DIAGNOSIS — IMO0002 Reserved for concepts with insufficient information to code with codable children: Secondary | ICD-10-CM

## 2020-02-11 DIAGNOSIS — E1165 Type 2 diabetes mellitus with hyperglycemia: Secondary | ICD-10-CM

## 2020-02-11 MED ORDER — ATORVASTATIN CALCIUM 20 MG PO TABS: 20.0000 mg | ORAL_TABLET | Freq: Every day | ORAL | 3 refills | Status: DC

## 2020-05-15 ENCOUNTER — Other Ambulatory Visit: Payer: Self-pay | Admitting: Internal Medicine

## 2020-05-17 ENCOUNTER — Other Ambulatory Visit: Payer: Self-pay | Admitting: Internal Medicine

## 2020-07-28 ENCOUNTER — Encounter: Payer: Self-pay | Admitting: Internal Medicine

## 2020-08-19 ENCOUNTER — Other Ambulatory Visit: Payer: Self-pay | Admitting: Internal Medicine

## 2020-09-03 ENCOUNTER — Other Ambulatory Visit: Payer: Self-pay | Admitting: Internal Medicine

## 2020-10-28 ENCOUNTER — Telehealth: Payer: Self-pay

## 2020-10-28 NOTE — Telephone Encounter (Signed)
Left vm for pt informing her of upcoming appt scheduled for 11/10/2020 at 10:20 am.

## 2020-11-10 ENCOUNTER — Ambulatory Visit: Payer: Medicare Other | Admitting: Internal Medicine

## 2020-12-03 ENCOUNTER — Telehealth: Payer: Self-pay

## 2020-12-03 NOTE — Telephone Encounter (Signed)
Rec'd paperwork from [employer] on 10/27/20 regarding requalifying pt for oxygen therapy.  Last appt on 01/30/20 does not meet [employer] requirements for requalification so pt needs OV. LVM for pt to schedule OV with PCP.

## 2020-12-08 NOTE — Telephone Encounter (Signed)
Appt made for 12/22 to discuss O2 therapy.

## 2020-12-14 ENCOUNTER — Other Ambulatory Visit: Payer: Self-pay | Admitting: Internal Medicine

## 2020-12-15 ENCOUNTER — Ambulatory Visit: Payer: Medicare Other | Admitting: Internal Medicine

## 2021-01-16 ENCOUNTER — Other Ambulatory Visit: Payer: Self-pay | Admitting: Internal Medicine

## 2021-02-23 ENCOUNTER — Ambulatory Visit (INDEPENDENT_AMBULATORY_CARE_PROVIDER_SITE_OTHER): Payer: Medicare Other | Admitting: Internal Medicine

## 2021-02-23 ENCOUNTER — Other Ambulatory Visit: Payer: Self-pay

## 2021-02-23 ENCOUNTER — Ambulatory Visit: Payer: Medicare Other | Admitting: Internal Medicine

## 2021-02-23 ENCOUNTER — Encounter: Payer: Self-pay | Admitting: Internal Medicine

## 2021-02-23 VITALS — BP 130/80 | HR 89 | Temp 97.8°F | Ht 60.0 in | Wt 184.0 lb

## 2021-02-23 DIAGNOSIS — IMO0002 Reserved for concepts with insufficient information to code with codable children: Secondary | ICD-10-CM

## 2021-02-23 DIAGNOSIS — F1120 Opioid dependence, uncomplicated: Secondary | ICD-10-CM | POA: Diagnosis not present

## 2021-02-23 DIAGNOSIS — I2729 Other secondary pulmonary hypertension: Secondary | ICD-10-CM

## 2021-02-23 DIAGNOSIS — E118 Type 2 diabetes mellitus with unspecified complications: Secondary | ICD-10-CM

## 2021-02-23 DIAGNOSIS — K219 Gastro-esophageal reflux disease without esophagitis: Secondary | ICD-10-CM

## 2021-02-23 DIAGNOSIS — Z30011 Encounter for initial prescription of contraceptive pills: Secondary | ICD-10-CM | POA: Diagnosis not present

## 2021-02-23 DIAGNOSIS — E1165 Type 2 diabetes mellitus with hyperglycemia: Secondary | ICD-10-CM | POA: Diagnosis not present

## 2021-02-23 DIAGNOSIS — I50810 Right heart failure, unspecified: Secondary | ICD-10-CM

## 2021-02-23 LAB — COMPREHENSIVE METABOLIC PANEL
ALT: 33 U/L (ref 0–35)
AST: 26 U/L (ref 0–37)
Albumin: 3.8 g/dL (ref 3.5–5.2)
Alkaline Phosphatase: 190 U/L — ABNORMAL HIGH (ref 39–117)
BUN: 13 mg/dL (ref 6–23)
CO2: 39 mEq/L — ABNORMAL HIGH (ref 19–32)
Calcium: 9.8 mg/dL (ref 8.4–10.5)
Chloride: 91 mEq/L — ABNORMAL LOW (ref 96–112)
Creatinine, Ser: 0.61 mg/dL (ref 0.40–1.20)
GFR: 115.05 mL/min (ref >60.00)
Glucose, Bld: 174 mg/dL — ABNORMAL HIGH (ref 70–99)
Potassium: 4.6 mEq/L (ref 3.5–5.1)
Sodium: 138 mEq/L (ref 135–145)
Total Bilirubin: 1.1 mg/dL (ref 0.2–1.2)
Total Protein: 7.1 g/dL (ref 6.0–8.3)

## 2021-02-23 LAB — LIPID PANEL
Cholesterol: 173 mg/dL (ref 0–200)
HDL: 34.7 mg/dL — ABNORMAL LOW (ref >39.00)
LDL Cholesterol: 110 mg/dL — ABNORMAL HIGH (ref 0–99)
NonHDL: 138.29
Total CHOL/HDL Ratio: 5
Triglycerides: 139 mg/dL (ref 0.0–149.0)
VLDL: 27.8 mg/dL (ref 0.0–40.0)

## 2021-02-23 LAB — CBC
HCT: 44.1 % (ref 36.0–46.0)
Hemoglobin: 13.7 g/dL (ref 12.0–15.0)
MCHC: 31.2 g/dL (ref 30.0–36.0)
MCV: 84.5 fl (ref 78.0–100.0)
Platelets: 257 10*3/uL (ref 150.0–400.0)
RBC: 5.22 Mil/uL — ABNORMAL HIGH (ref 3.87–5.11)
RDW: 17.4 % — ABNORMAL HIGH (ref 11.5–15.5)
WBC: 11.5 10*3/uL — ABNORMAL HIGH (ref 4.0–10.5)

## 2021-02-23 LAB — HEMOGLOBIN A1C: Hgb A1c MFr Bld: 10.9 % — ABNORMAL HIGH (ref 4.6–6.5)

## 2021-02-23 LAB — MICROALBUMIN / CREATININE URINE RATIO
Creatinine,U: 70.8 mg/dL
Microalb Creat Ratio: 401.5 mg/g — ABNORMAL HIGH (ref 0.0–30.0)
Microalb, Ur: 284.1 mg/dL — ABNORMAL HIGH (ref 0.0–1.9)

## 2021-02-23 MED ORDER — PANTOPRAZOLE SODIUM 40 MG PO TBEC: 40.0000 mg | DELAYED_RELEASE_TABLET | Freq: Every day | ORAL | 3 refills | Status: DC

## 2021-02-23 MED ORDER — EMPAGLIFLOZIN 25 MG PO TABS: 25.0000 mg | ORAL_TABLET | Freq: Every day | ORAL | 0 refills | Status: DC

## 2021-02-23 MED ORDER — METFORMIN HCL 500 MG PO TABS: 500.0000 mg | ORAL_TABLET | Freq: Two times a day (BID) | ORAL | 1 refills | Status: DC

## 2021-02-23 NOTE — Patient Instructions (Addendum)
We have sent in jardiance to add 1 pill daily for the sugars.   We are checking the labs today.  We have given you the prescription for the wheelchair.   I would strongly recommend the covid-19 vaccine either the moderna or phizer either is fine. Go to vaccines.gov to find a shot closest to you.   Diabetes Mellitus and Standards of Medical Care Living with and managing diabetes (diabetes mellitus) can be complicated. Your diabetes treatment may be managed by a team of health care providers, including:  A physician who specializes in diabetes (endocrinologist). You might also have visits with a nurse practitioner or physician assistant.  Nurses.  A registered dietitian.  A certified diabetes care and education specialist.  An exercise specialist.  A pharmacist.  An eye doctor.  A foot specialist (podiatrist).  A dental care provider.  A primary care provider.  A mental health care provider. How to manage your diabetes You can do many things to successfully manage your diabetes. Your health care providers will follow guidelines to help you get the best quality of care. Here are general guidelines for your diabetes management plan. Your health care providers may give you more specific instructions. Physical exams When you are diagnosed with diabetes, and each year after that, your health care provider will ask about your medical and family history. You will have a physical exam, which may include:  Measuring your height, weight, and body mass index (BMI).  Checking your blood pressure. This will be done at every routine medical visit. Your target blood pressure may vary depending on your medical conditions, your age, and other factors.  A thyroid exam.  A skin exam.  Screening for nerve damage (peripheral neuropathy). This may include checking the pulse in your legs and feet and the level of sensation in your hands and feet.  A foot exam to inspect the structure and skin  of your feet, including checking for cuts, bruises, redness, blisters, sores, or other problems.  Screening for blood vessel (vascular) problems. This may include checking the pulse in your legs and feet and checking your temperature. Blood tests Depending on your treatment plan and your personal needs, you may have the following tests:  Hemoglobin A1C (HbA1C). This test provides information about blood sugar (glucose) control over the previous 2-3 months. It is used to adjust your treatment plan, if needed. This test will be done: ? At least 2 times a year, if you are meeting your treatment goals. ? 4 times a year, if you are not meeting your treatment goals or if your goals have changed.  Lipid testing, including total cholesterol, LDL and HDL cholesterol, and triglyceride levels. ? The goal for LDL is less than 100 mg/dL (5.5 mmol/L). If you are at high risk for complications, the goal is less than 70 mg/dL (3.9 mmol/L). ? The goal for HDL is 40 mg/dL (2.2 mmol/L) or higher for men, and 50 mg/dL (2.8 mmol/L) or higher for women. An HDL cholesterol of 60 mg/dL (3.3 mmol/L) or higher gives some protection against heart disease. ? The goal for triglycerides is less than 150 mg/dL (8.3 mmol/L).  Liver function tests.  Kidney function tests.  Thyroid function tests.   Dental and eye exams  Visit your dentist two times a year.  If you have type 1 diabetes, your health care provider may recommend an eye exam within 5 years after you are diagnosed, and then once a year after your first exam. ? For  children with type 1 diabetes, the health care provider may recommend an eye exam when your child is age 54 or older and has had diabetes for 3-5 years. After the first exam, your child should get an eye exam once a year.  If you have type 2 diabetes, your health care provider may recommend an eye exam as soon as you are diagnosed, and then every 1-2 years after your first exam.   Immunizations  A  yearly flu (influenza) vaccine is recommended annually for everyone 6 months or older. This is especially important if you have diabetes.  The pneumonia (pneumococcal) vaccine is recommended for everyone 2 years or older who has diabetes. If you are age 59 or older, you may get the pneumonia vaccine as a series of two separate shots.  The hepatitis B vaccine is recommended for adults shortly after being diagnosed with diabetes. Adults and children with diabetes should receive all other vaccines according to age-specific recommendations from the Centers for Disease Control and Prevention (CDC). Mental and emotional health Screening for symptoms of eating disorders, anxiety, and depression is recommended at the time of diagnosis and after as needed. If your screening shows that you have symptoms, you may need more evaluation. You may work with a mental health care provider. Follow these instructions at home: Treatment plan You will monitor your blood glucose levels and may give yourself insulin. Your treatment plan will be reviewed at every medical visit. You and your health care provider will discuss:  How you are taking your medicines, including insulin.  Any side effects you have.  Your blood glucose level target goals.  How often you monitor your blood glucose level.  Lifestyle habits, such as activity level and tobacco, alcohol, and substance use. Education Your health care provider will assess how well you are monitoring your blood glucose levels and whether you are taking your insulin and medicines correctly. He or she may refer you to:  A certified diabetes care and education specialist to manage your diabetes throughout your life, starting at diagnosis.  A registered dietitian who can create and review your personal nutrition plan.  An exercise specialist who can discuss your activity level and exercise plan. General instructions  Take over-the-counter and prescription medicines  only as told by your health care provider.  Keep all follow-up visits. This is important. Where to find support There are many diabetes support networks, including:  American Diabetes Association (ADA): diabetes.org  Defeat Diabetes Foundation: defeatdiabetes.org Where to find more information  American Diabetes Association (ADA): www.diabetes.org  Association of Diabetes Care & Education Specialists (ADCES): diabeteseducator.org  International Diabetes Federation (IDF): http://hill.biz/ Summary  Managing diabetes (diabetes mellitus) can be complicated. Your diabetes treatment may be managed by a team of health care providers.  Your health care providers follow guidelines to help you get the best quality care.  You should have physical exams, blood tests, blood pressure monitoring, immunizations, and screening tests regularly. Stay updated on how to manage your diabetes.  Your health care providers may also give you more specific instructions based on your individual health. This information is not intended to replace advice given to you by your health care provider. Make sure you discuss any questions you have with your health care provider. Document Revised: 06/17/2020 Document Reviewed: 06/17/2020 Elsevier Patient Education  2021 [employer].

## 2021-02-23 NOTE — Progress Notes (Signed)
   Subjective:   Patient ID: [name], female    DOB: Jan 05, 1986, 35 y.o.   MRN: [id number]  HPI The patient is a 35 YO female coming in for follow up of her diabetes (poorly controlled, not available via phone to make changes after labs last visit 1 year ago, does state she is taking glipizide and metformin, denies change in diet, activity limited by her lung/heart disease) and birth control need (taking ocps, denies current risk of pregnancy, would like refill when due), and GERD (taking protonix daily, denies worsening symptoms, denies being able to miss without symptoms).   Review of Systems  Constitutional: Negative.   HENT: Negative.   Eyes: Negative.   Respiratory: Positive for shortness of breath. Negative for cough and chest tightness.   Cardiovascular: Negative for chest pain, palpitations and leg swelling.  Gastrointestinal: Negative for abdominal distention, abdominal pain, constipation, diarrhea, nausea and vomiting.  Musculoskeletal: Positive for arthralgias and back pain.  Skin: Negative.   Neurological: Negative.   Psychiatric/Behavioral: Negative.     Objective:  Physical Exam Constitutional:      Appearance: She is well-developed and well-nourished. She is obese.  HENT:     Head: Normocephalic and atraumatic.  Eyes:     Extraocular Movements: EOM normal.  Cardiovascular:     Rate and Rhythm: Normal rate and regular rhythm.  Pulmonary:     Effort: Pulmonary effort is normal. No respiratory distress.     Breath sounds: Normal breath sounds. No wheezing or rales.     Comments: On oxygen 3-4 L Abdominal:     General: Bowel sounds are normal. There is no distension.     Palpations: Abdomen is soft.     Tenderness: There is no abdominal tenderness. There is no rebound.  Musculoskeletal:        General: No edema.     Cervical back: Normal range of motion.  Skin:    General: Skin is warm and dry.  Neurological:     Mental Status: She is alert and  oriented to person, place, and time.     Coordination: Coordination normal.  Psychiatric:        Mood and Affect: Mood and affect normal.     Vitals:   02/23/21 1043  BP: 130/80  Pulse: 89  Temp: 97.8 F (36.6 C)  TempSrc: Oral  SpO2: 99%  Weight: 184 lb (83.5 kg)  Height: 5' (1.524 m)    This visit occurred during the SARS-CoV-2 public health emergency.  Safety protocols were in place, including screening questions prior to the visit, additional usage of staff PPE, and extensive cleaning of exam room while observing appropriate contact time as indicated for disinfecting solutions.   Assessment & Plan:

## 2021-02-24 LAB — TSH: TSH: 1.7 u[IU]/mL (ref 0.35–4.50)

## 2021-02-24 LAB — T4, FREE: Free T4: 0.85 ng/dL (ref 0.60–1.60)

## 2021-02-25 MED ORDER — CRYSELLE-28 0.3-30 MG-MCG PO TABS: 1.0000 | ORAL_TABLET | Freq: Every day | ORAL | 3 refills | Status: DC

## 2021-02-25 NOTE — Assessment & Plan Note (Signed)
Taking protonix and refilled today.  

## 2021-02-25 NOTE — Assessment & Plan Note (Signed)
Seeing pain management and continues with her hydrocodone.

## 2021-02-25 NOTE — Assessment & Plan Note (Signed)
Needs refill and done today. Denies missing or concerns of pregnancy.

## 2021-02-25 NOTE — Assessment & Plan Note (Signed)
It is unclear if she is having appropriate follow up for this. She admits to seeing lung specialist for [facility] with office in [place] not sure of provider name but I do not see any claims data of this. She is taking opsumit per her reports. Checking CMP and CBC as unclear if these are up to date.

## 2021-02-25 NOTE — Assessment & Plan Note (Addendum)
Given that she is uncontrolled in the past and no change to her medications it seems likely that the HgA1c will still be elevated. We have decided to add jardiance since it is difficult to get in touch with her about results. Continue metformin and glipizide. Checking microalbumin to creatinine ratio and is not on ACE-I at this time (she was in the past unclear what happened). Is on statin but compliance is unclear. Checking lipid panel. Reminded about need for eye exam which is not up to date. Foot exam done. Needs follow up in 3 months.

## 2021-03-16 ENCOUNTER — Other Ambulatory Visit: Payer: Self-pay | Admitting: Internal Medicine

## 2021-03-24 ENCOUNTER — Other Ambulatory Visit: Payer: Self-pay | Admitting: Internal Medicine

## 2021-04-07 DIAGNOSIS — Z79899 Other long term (current) drug therapy: Secondary | ICD-10-CM | POA: Diagnosis not present

## 2021-04-07 DIAGNOSIS — E119 Type 2 diabetes mellitus without complications: Secondary | ICD-10-CM | POA: Diagnosis not present

## 2021-04-07 DIAGNOSIS — G8929 Other chronic pain: Secondary | ICD-10-CM | POA: Diagnosis not present

## 2021-04-07 DIAGNOSIS — M545 Low back pain, unspecified: Secondary | ICD-10-CM | POA: Diagnosis not present

## 2021-04-07 DIAGNOSIS — G894 Chronic pain syndrome: Secondary | ICD-10-CM | POA: Diagnosis not present

## 2021-04-07 DIAGNOSIS — E669 Obesity, unspecified: Secondary | ICD-10-CM | POA: Diagnosis not present

## 2021-04-09 DIAGNOSIS — R011 Cardiac murmur, unspecified: Secondary | ICD-10-CM | POA: Diagnosis not present

## 2021-04-09 DIAGNOSIS — J188 Other pneumonia, unspecified organism: Secondary | ICD-10-CM | POA: Diagnosis not present

## 2021-05-06 DIAGNOSIS — E119 Type 2 diabetes mellitus without complications: Secondary | ICD-10-CM | POA: Diagnosis not present

## 2021-05-06 DIAGNOSIS — G8929 Other chronic pain: Secondary | ICD-10-CM | POA: Diagnosis not present

## 2021-05-06 DIAGNOSIS — G894 Chronic pain syndrome: Secondary | ICD-10-CM | POA: Diagnosis not present

## 2021-05-06 DIAGNOSIS — Z79899 Other long term (current) drug therapy: Secondary | ICD-10-CM | POA: Diagnosis not present

## 2021-05-06 DIAGNOSIS — M545 Low back pain, unspecified: Secondary | ICD-10-CM | POA: Diagnosis not present

## 2021-05-06 DIAGNOSIS — E669 Obesity, unspecified: Secondary | ICD-10-CM | POA: Diagnosis not present

## 2021-05-08 ENCOUNTER — Other Ambulatory Visit: Payer: Self-pay | Admitting: Internal Medicine

## 2021-05-09 DIAGNOSIS — J188 Other pneumonia, unspecified organism: Secondary | ICD-10-CM | POA: Diagnosis not present

## 2021-05-09 DIAGNOSIS — R011 Cardiac murmur, unspecified: Secondary | ICD-10-CM | POA: Diagnosis not present

## 2021-06-07 DIAGNOSIS — Z79899 Other long term (current) drug therapy: Secondary | ICD-10-CM | POA: Diagnosis not present

## 2021-06-07 DIAGNOSIS — E119 Type 2 diabetes mellitus without complications: Secondary | ICD-10-CM | POA: Diagnosis not present

## 2021-06-07 DIAGNOSIS — E669 Obesity, unspecified: Secondary | ICD-10-CM | POA: Diagnosis not present

## 2021-06-07 DIAGNOSIS — R030 Elevated blood-pressure reading, without diagnosis of hypertension: Secondary | ICD-10-CM | POA: Diagnosis not present

## 2021-06-07 DIAGNOSIS — M545 Low back pain, unspecified: Secondary | ICD-10-CM | POA: Diagnosis not present

## 2021-06-07 DIAGNOSIS — G894 Chronic pain syndrome: Secondary | ICD-10-CM | POA: Diagnosis not present

## 2021-06-07 DIAGNOSIS — G8929 Other chronic pain: Secondary | ICD-10-CM | POA: Diagnosis not present

## 2021-06-09 DIAGNOSIS — R011 Cardiac murmur, unspecified: Secondary | ICD-10-CM | POA: Diagnosis not present

## 2021-06-09 DIAGNOSIS — J188 Other pneumonia, unspecified organism: Secondary | ICD-10-CM | POA: Diagnosis not present

## 2021-07-07 DIAGNOSIS — G894 Chronic pain syndrome: Secondary | ICD-10-CM | POA: Diagnosis not present

## 2021-07-07 DIAGNOSIS — E669 Obesity, unspecified: Secondary | ICD-10-CM | POA: Diagnosis not present

## 2021-07-07 DIAGNOSIS — M545 Low back pain, unspecified: Secondary | ICD-10-CM | POA: Diagnosis not present

## 2021-07-07 DIAGNOSIS — E119 Type 2 diabetes mellitus without complications: Secondary | ICD-10-CM | POA: Diagnosis not present

## 2021-07-07 DIAGNOSIS — R030 Elevated blood-pressure reading, without diagnosis of hypertension: Secondary | ICD-10-CM | POA: Diagnosis not present

## 2021-07-07 DIAGNOSIS — G8929 Other chronic pain: Secondary | ICD-10-CM | POA: Diagnosis not present

## 2021-07-07 DIAGNOSIS — Z79899 Other long term (current) drug therapy: Secondary | ICD-10-CM | POA: Diagnosis not present

## 2021-07-08 ENCOUNTER — Other Ambulatory Visit: Payer: Self-pay | Admitting: Internal Medicine

## 2021-07-09 DIAGNOSIS — R011 Cardiac murmur, unspecified: Secondary | ICD-10-CM | POA: Diagnosis not present

## 2021-07-09 DIAGNOSIS — J188 Other pneumonia, unspecified organism: Secondary | ICD-10-CM | POA: Diagnosis not present

## 2021-07-26 ENCOUNTER — Other Ambulatory Visit: Payer: Self-pay | Admitting: Internal Medicine

## 2021-08-05 DIAGNOSIS — M545 Low back pain, unspecified: Secondary | ICD-10-CM | POA: Diagnosis not present

## 2021-08-05 DIAGNOSIS — R030 Elevated blood-pressure reading, without diagnosis of hypertension: Secondary | ICD-10-CM | POA: Diagnosis not present

## 2021-08-05 DIAGNOSIS — G8929 Other chronic pain: Secondary | ICD-10-CM | POA: Diagnosis not present

## 2021-08-05 DIAGNOSIS — G894 Chronic pain syndrome: Secondary | ICD-10-CM | POA: Diagnosis not present

## 2021-08-05 DIAGNOSIS — Z79899 Other long term (current) drug therapy: Secondary | ICD-10-CM | POA: Diagnosis not present

## 2021-08-05 DIAGNOSIS — Z6833 Body mass index (BMI) 33.0-33.9, adult: Secondary | ICD-10-CM | POA: Diagnosis not present

## 2021-08-06 ENCOUNTER — Other Ambulatory Visit: Payer: Self-pay | Admitting: Internal Medicine

## 2021-08-09 DIAGNOSIS — J188 Other pneumonia, unspecified organism: Secondary | ICD-10-CM | POA: Diagnosis not present

## 2021-08-09 DIAGNOSIS — R011 Cardiac murmur, unspecified: Secondary | ICD-10-CM | POA: Diagnosis not present

## 2021-09-05 DIAGNOSIS — Z6832 Body mass index (BMI) 32.0-32.9, adult: Secondary | ICD-10-CM | POA: Diagnosis not present

## 2021-09-05 DIAGNOSIS — G8929 Other chronic pain: Secondary | ICD-10-CM | POA: Diagnosis not present

## 2021-09-05 DIAGNOSIS — E119 Type 2 diabetes mellitus without complications: Secondary | ICD-10-CM | POA: Diagnosis not present

## 2021-09-05 DIAGNOSIS — Z79899 Other long term (current) drug therapy: Secondary | ICD-10-CM | POA: Diagnosis not present

## 2021-09-05 DIAGNOSIS — R030 Elevated blood-pressure reading, without diagnosis of hypertension: Secondary | ICD-10-CM | POA: Diagnosis not present

## 2021-09-05 DIAGNOSIS — M545 Low back pain, unspecified: Secondary | ICD-10-CM | POA: Diagnosis not present

## 2021-09-05 DIAGNOSIS — G894 Chronic pain syndrome: Secondary | ICD-10-CM | POA: Diagnosis not present

## 2021-09-08 ENCOUNTER — Other Ambulatory Visit: Payer: Self-pay | Admitting: Internal Medicine

## 2021-09-09 DIAGNOSIS — R011 Cardiac murmur, unspecified: Secondary | ICD-10-CM | POA: Diagnosis not present

## 2021-09-09 DIAGNOSIS — J188 Other pneumonia, unspecified organism: Secondary | ICD-10-CM | POA: Diagnosis not present

## 2021-10-04 DIAGNOSIS — M545 Low back pain, unspecified: Secondary | ICD-10-CM | POA: Diagnosis not present

## 2021-10-04 DIAGNOSIS — R030 Elevated blood-pressure reading, without diagnosis of hypertension: Secondary | ICD-10-CM | POA: Diagnosis not present

## 2021-10-04 DIAGNOSIS — Z79899 Other long term (current) drug therapy: Secondary | ICD-10-CM | POA: Diagnosis not present

## 2021-10-04 DIAGNOSIS — G894 Chronic pain syndrome: Secondary | ICD-10-CM | POA: Diagnosis not present

## 2021-10-04 DIAGNOSIS — E119 Type 2 diabetes mellitus without complications: Secondary | ICD-10-CM | POA: Diagnosis not present

## 2021-10-09 DIAGNOSIS — J188 Other pneumonia, unspecified organism: Secondary | ICD-10-CM | POA: Diagnosis not present

## 2021-10-09 DIAGNOSIS — R011 Cardiac murmur, unspecified: Secondary | ICD-10-CM | POA: Diagnosis not present

## 2021-11-03 DIAGNOSIS — G8929 Other chronic pain: Secondary | ICD-10-CM | POA: Diagnosis not present

## 2021-11-03 DIAGNOSIS — G894 Chronic pain syndrome: Secondary | ICD-10-CM | POA: Diagnosis not present

## 2021-11-03 DIAGNOSIS — Z6833 Body mass index (BMI) 33.0-33.9, adult: Secondary | ICD-10-CM | POA: Diagnosis not present

## 2021-11-03 DIAGNOSIS — E119 Type 2 diabetes mellitus without complications: Secondary | ICD-10-CM | POA: Diagnosis not present

## 2021-11-03 DIAGNOSIS — Z79899 Other long term (current) drug therapy: Secondary | ICD-10-CM | POA: Diagnosis not present

## 2021-11-03 DIAGNOSIS — M545 Low back pain, unspecified: Secondary | ICD-10-CM | POA: Diagnosis not present

## 2021-11-03 DIAGNOSIS — R030 Elevated blood-pressure reading, without diagnosis of hypertension: Secondary | ICD-10-CM | POA: Diagnosis not present

## 2021-11-09 DIAGNOSIS — R011 Cardiac murmur, unspecified: Secondary | ICD-10-CM | POA: Diagnosis not present

## 2021-11-09 DIAGNOSIS — J188 Other pneumonia, unspecified organism: Secondary | ICD-10-CM | POA: Diagnosis not present

## 2021-11-12 ENCOUNTER — Other Ambulatory Visit: Payer: Self-pay | Admitting: Internal Medicine

## 2021-12-02 DIAGNOSIS — Z6833 Body mass index (BMI) 33.0-33.9, adult: Secondary | ICD-10-CM | POA: Diagnosis not present

## 2021-12-02 DIAGNOSIS — M545 Low back pain, unspecified: Secondary | ICD-10-CM | POA: Diagnosis not present

## 2021-12-02 DIAGNOSIS — E119 Type 2 diabetes mellitus without complications: Secondary | ICD-10-CM | POA: Diagnosis not present

## 2021-12-02 DIAGNOSIS — R030 Elevated blood-pressure reading, without diagnosis of hypertension: Secondary | ICD-10-CM | POA: Diagnosis not present

## 2021-12-02 DIAGNOSIS — G8929 Other chronic pain: Secondary | ICD-10-CM | POA: Diagnosis not present

## 2021-12-02 DIAGNOSIS — G894 Chronic pain syndrome: Secondary | ICD-10-CM | POA: Diagnosis not present

## 2021-12-02 DIAGNOSIS — Z79899 Other long term (current) drug therapy: Secondary | ICD-10-CM | POA: Diagnosis not present

## 2021-12-09 DIAGNOSIS — R011 Cardiac murmur, unspecified: Secondary | ICD-10-CM | POA: Diagnosis not present

## 2021-12-09 DIAGNOSIS — J188 Other pneumonia, unspecified organism: Secondary | ICD-10-CM | POA: Diagnosis not present

## 2021-12-25 ENCOUNTER — Other Ambulatory Visit: Payer: Self-pay | Admitting: Internal Medicine

## 2021-12-30 DIAGNOSIS — E119 Type 2 diabetes mellitus without complications: Secondary | ICD-10-CM | POA: Diagnosis not present

## 2021-12-30 DIAGNOSIS — G8929 Other chronic pain: Secondary | ICD-10-CM | POA: Diagnosis not present

## 2021-12-30 DIAGNOSIS — G894 Chronic pain syndrome: Secondary | ICD-10-CM | POA: Diagnosis not present

## 2021-12-30 DIAGNOSIS — Z79899 Other long term (current) drug therapy: Secondary | ICD-10-CM | POA: Diagnosis not present

## 2021-12-30 DIAGNOSIS — M545 Low back pain, unspecified: Secondary | ICD-10-CM | POA: Diagnosis not present

## 2021-12-30 DIAGNOSIS — Z6834 Body mass index (BMI) 34.0-34.9, adult: Secondary | ICD-10-CM | POA: Diagnosis not present

## 2021-12-30 DIAGNOSIS — R030 Elevated blood-pressure reading, without diagnosis of hypertension: Secondary | ICD-10-CM | POA: Diagnosis not present

## 2021-12-31 ENCOUNTER — Other Ambulatory Visit: Payer: Self-pay | Admitting: Internal Medicine

## 2022-01-09 DIAGNOSIS — R011 Cardiac murmur, unspecified: Secondary | ICD-10-CM | POA: Diagnosis not present

## 2022-01-09 DIAGNOSIS — J188 Other pneumonia, unspecified organism: Secondary | ICD-10-CM | POA: Diagnosis not present

## 2022-01-31 DIAGNOSIS — R030 Elevated blood-pressure reading, without diagnosis of hypertension: Secondary | ICD-10-CM | POA: Diagnosis not present

## 2022-01-31 DIAGNOSIS — M545 Low back pain, unspecified: Secondary | ICD-10-CM | POA: Diagnosis not present

## 2022-01-31 DIAGNOSIS — G8929 Other chronic pain: Secondary | ICD-10-CM | POA: Diagnosis not present

## 2022-01-31 DIAGNOSIS — Z79899 Other long term (current) drug therapy: Secondary | ICD-10-CM | POA: Diagnosis not present

## 2022-01-31 DIAGNOSIS — E119 Type 2 diabetes mellitus without complications: Secondary | ICD-10-CM | POA: Diagnosis not present

## 2022-01-31 DIAGNOSIS — M25551 Pain in right hip: Secondary | ICD-10-CM | POA: Diagnosis not present

## 2022-01-31 DIAGNOSIS — G894 Chronic pain syndrome: Secondary | ICD-10-CM | POA: Diagnosis not present

## 2022-01-31 DIAGNOSIS — Z6834 Body mass index (BMI) 34.0-34.9, adult: Secondary | ICD-10-CM | POA: Diagnosis not present

## 2022-02-07 ENCOUNTER — Encounter: Payer: Self-pay | Admitting: Internal Medicine

## 2022-02-07 ENCOUNTER — Other Ambulatory Visit: Payer: Self-pay

## 2022-02-07 ENCOUNTER — Ambulatory Visit (INDEPENDENT_AMBULATORY_CARE_PROVIDER_SITE_OTHER): Payer: Medicare HMO | Admitting: Internal Medicine

## 2022-02-07 ENCOUNTER — Other Ambulatory Visit: Payer: Self-pay | Admitting: Internal Medicine

## 2022-02-07 VITALS — BP 126/80 | HR 97 | Resp 18 | Ht 60.0 in | Wt 184.0 lb

## 2022-02-07 DIAGNOSIS — Z8774 Personal history of (corrected) congenital malformations of heart and circulatory system: Secondary | ICD-10-CM | POA: Diagnosis not present

## 2022-02-07 DIAGNOSIS — E118 Type 2 diabetes mellitus with unspecified complications: Secondary | ICD-10-CM | POA: Diagnosis not present

## 2022-02-07 DIAGNOSIS — R1084 Generalized abdominal pain: Secondary | ICD-10-CM | POA: Diagnosis not present

## 2022-02-07 DIAGNOSIS — F5101 Primary insomnia: Secondary | ICD-10-CM

## 2022-02-07 DIAGNOSIS — I50810 Right heart failure, unspecified: Secondary | ICD-10-CM | POA: Diagnosis not present

## 2022-02-07 DIAGNOSIS — I2729 Other secondary pulmonary hypertension: Secondary | ICD-10-CM

## 2022-02-07 DIAGNOSIS — F419 Anxiety disorder, unspecified: Secondary | ICD-10-CM

## 2022-02-07 DIAGNOSIS — R69 Illness, unspecified: Secondary | ICD-10-CM | POA: Diagnosis not present

## 2022-02-07 DIAGNOSIS — G4700 Insomnia, unspecified: Secondary | ICD-10-CM | POA: Insufficient documentation

## 2022-02-07 LAB — CBC
HCT: 37.2 % (ref 36.0–46.0)
Hemoglobin: 11.9 g/dL — ABNORMAL LOW (ref 12.0–15.0)
MCHC: 32 g/dL (ref 30.0–36.0)
MCV: 90.9 fl (ref 78.0–100.0)
Platelets: 272 10*3/uL (ref 150.0–400.0)
RBC: 4.09 Mil/uL (ref 3.87–5.11)
RDW: 16.1 % — ABNORMAL HIGH (ref 11.5–15.5)
WBC: 9.1 10*3/uL (ref 4.0–10.5)

## 2022-02-07 LAB — HEMOGLOBIN A1C: Hgb A1c MFr Bld: 8.8 % — ABNORMAL HIGH (ref 4.6–6.5)

## 2022-02-07 LAB — MICROALBUMIN / CREATININE URINE RATIO
Creatinine,U: 138.4 mg/dL
Microalb Creat Ratio: 45.2 mg/g — ABNORMAL HIGH (ref 0.0–30.0)
Microalb, Ur: 62.5 mg/dL — ABNORMAL HIGH (ref 0.0–1.9)

## 2022-02-07 MED ORDER — BUSPIRONE HCL 5 MG PO TABS: 5.0000 mg | ORAL_TABLET | Freq: Two times a day (BID) | ORAL | 0 refills | Status: DC | PRN

## 2022-02-07 MED ORDER — TRAZODONE HCL 50 MG PO TABS: 50.0000 mg | ORAL_TABLET | Freq: Every day | ORAL | 1 refills | Status: DC

## 2022-02-07 MED ORDER — ALBUTEROL SULFATE HFA 108 (90 BASE) MCG/ACT IN AERS: 1.0000 | INHALATION_SPRAY | RESPIRATORY_TRACT | 3 refills | Status: DC | PRN

## 2022-02-07 MED ORDER — BUPROPION HCL ER (XL) 150 MG PO TB24: 150.0000 mg | ORAL_TABLET | Freq: Every day | ORAL | 1 refills | Status: DC

## 2022-02-07 MED ORDER — EMPAGLIFLOZIN 25 MG PO TABS: ORAL_TABLET | ORAL | 3 refills | Status: DC

## 2022-02-07 NOTE — Patient Instructions (Addendum)
We will check the labs today.   We have sent in wellbutrin to add to the zoloft for the anxiety. We have sent in buspar to take as needed for the panic attacks.  We will get you back in with the lung and heart doctor locally to help get things back on track.  We can try trazodone for the sleep.

## 2022-02-07 NOTE — Assessment & Plan Note (Signed)
Checking HgA1c, foot exam done. Checking microalbumin to creatinine ratio. No recent eye exam. She was asked to start lisinopril last year for proteinuria but did not and did not follow up as advised. She did start jardiance last year without follow up. Checking CMP and CBC and HgA1c today. Adjust jardiance, metformin, glipizide. Is prescribed statin but not taking likely. Checking lipid panel as well. Reminded about importance of eye exam.

## 2022-02-07 NOTE — Progress Notes (Signed)
° °  Subjective:   Patient ID: [name], female    DOB: 05/19/1986, 36 y.o.   MRN: [id number]  Diabetes Hypoglycemia symptoms include nervousness/anxiousness. Associated symptoms include fatigue. Pertinent negatives for diabetes include no chest pain.  Anxiety Symptoms include decreased concentration, nervous/anxious behavior and shortness of breath. Patient reports no chest pain, nausea or palpitations.    The patient is a 36 YO female coming in for follow up multiple uncontrolled conditions.   Review of Systems  Constitutional:  Positive for activity change, appetite change and fatigue.  HENT: Negative.    Eyes: Negative.   Respiratory:  Positive for shortness of breath. Negative for cough and chest tightness.   Cardiovascular:  Negative for chest pain, palpitations and leg swelling.  Gastrointestinal:  Positive for abdominal distention, abdominal pain and diarrhea. Negative for anal bleeding, blood in stool, constipation, nausea and vomiting.  Musculoskeletal:  Positive for arthralgias and back pain.  Skin: Negative.   Neurological: Negative.   Psychiatric/Behavioral:  Positive for decreased concentration, dysphoric mood and sleep disturbance. The patient is nervous/anxious.    Objective:  Physical Exam Constitutional:      Appearance: She is well-developed. She is obese. She is ill-appearing.  HENT:     Head: Normocephalic and atraumatic.  Cardiovascular:     Rate and Rhythm: Normal rate and regular rhythm.  Pulmonary:     Effort: Pulmonary effort is normal. No respiratory distress.     Breath sounds: Normal breath sounds. No wheezing or rales.     Comments: Oxygen 2-3 L chronically Abdominal:     General: Bowel sounds are normal. There is no distension.     Palpations: Abdomen is soft.     Tenderness: There is no abdominal tenderness. There is no rebound.  Musculoskeletal:     Cervical back: Normal range of motion.  Skin:    General: Skin is warm and dry.      Comments: Foot exam done  Neurological:     Mental Status: She is alert and oriented to person, place, and time.     Coordination: Coordination abnormal.    Vitals:   02/07/22 1023  BP: 126/80  Pulse: 97  Resp: 18  SpO2: 99%  Weight: 184 lb (83.5 kg)  Height: 5' (1.524 m)    This visit occurred during the SARS-CoV-2 public health emergency.  Safety protocols were in place, including screening questions prior to the visit, additional usage of staff PPE, and extensive cleaning of exam room while observing appropriate contact time as indicated for disinfecting solutions.   Assessment & Plan:

## 2022-02-07 NOTE — Assessment & Plan Note (Signed)
Referral to cardiology and pulmonary locally. She had previously been seen at [facility] but she has not followed up with them in some years and finally admitted today that she has not been on opsumit for some time also. No recent imaging. Likely due for echo. Refilled albuterol. She is on chronic oxygen 2-3 liters which we have maintained.

## 2022-02-07 NOTE — Assessment & Plan Note (Signed)
Having more problems with this recently. She is taking zoloft 200 mg daily and will add wellbutrin 150 mg daily to help. Rx buspar to use prn for panic episodes. Due to prior violation of pain contract we will not prescribe controlled substances for her.

## 2022-02-07 NOTE — Assessment & Plan Note (Signed)
Checking CBC, CMP, CT abdomen and pelvis. Could be related to gastroparesis as she has had uncontrolled diabetes for a long time. Counseled today about the importance of getting sugars under control for her long term health.

## 2022-02-07 NOTE — Assessment & Plan Note (Signed)
Rx trazodone 50-100 mg daily for sleep today.

## 2022-02-07 NOTE — Assessment & Plan Note (Signed)
Referral to cardiology and pulmonary for assessment of ongoing needs. Previously seen at [facility] but due to social determinates has not been able to manage this trip in some years and did finally admit that. Is off opsumit for some time (unknown how long).

## 2022-02-08 LAB — COMPREHENSIVE METABOLIC PANEL
ALT: 65 U/L — ABNORMAL HIGH (ref 0–35)
AST: 28 U/L (ref 0–37)
Albumin: 4.2 g/dL (ref 3.5–5.2)
Alkaline Phosphatase: 130 U/L — ABNORMAL HIGH (ref 39–117)
BUN: 16 mg/dL (ref 6–23)
CO2: 35 mEq/L — ABNORMAL HIGH (ref 19–32)
Calcium: 10.3 mg/dL (ref 8.4–10.5)
Chloride: 92 mEq/L — ABNORMAL LOW (ref 96–112)
Creatinine, Ser: 0.74 mg/dL (ref 0.40–1.20)
GFR: 103.42 mL/min (ref >60.00)
Glucose, Bld: 180 mg/dL — ABNORMAL HIGH (ref 70–99)
Potassium: 4.8 mEq/L (ref 3.5–5.1)
Sodium: 138 mEq/L (ref 135–145)
Total Bilirubin: 0.5 mg/dL (ref 0.2–1.2)
Total Protein: 8.2 g/dL (ref 6.0–8.3)

## 2022-02-08 LAB — LDL CHOLESTEROL, DIRECT: Direct LDL: 149 mg/dL

## 2022-02-08 LAB — LIPID PANEL
Cholesterol: 246 mg/dL — ABNORMAL HIGH (ref 0–200)
HDL: 53.1 mg/dL (ref >39.00)
NonHDL: 192.88
Total CHOL/HDL Ratio: 5
Triglycerides: 344 mg/dL — ABNORMAL HIGH (ref 0.0–149.0)
VLDL: 68.8 mg/dL — ABNORMAL HIGH (ref 0.0–40.0)

## 2022-02-09 DIAGNOSIS — R011 Cardiac murmur, unspecified: Secondary | ICD-10-CM | POA: Diagnosis not present

## 2022-02-09 DIAGNOSIS — J188 Other pneumonia, unspecified organism: Secondary | ICD-10-CM | POA: Diagnosis not present

## 2022-02-09 NOTE — Telephone Encounter (Signed)
Patient calling in  Received all rx refills but still needing refill for pantoprazole (PROTONIX) 40 MG tablet  Please submit to pharmacy

## 2022-02-16 NOTE — Progress Notes (Signed)
Maria Bison, MD Reason for referral-status post repair of tetralogy of Fallot  HPI: 36 year old female for evaluation of previous repair of tetralogy of Fallot at request of Pricilla Holm, MD.  Previously seen by Dr. Haroldine Laws and at [facility].  Patient has a history of tetralogy of Fallot and underwent right modified Blalock-Taussig shunt at age 23 months.  Patient underwent ligation of the shunt and complete repair at age 58-1/2.  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  Catheterization in 2013 showed PA pressure 54/12 and PVR of 5.5 Wood units, no anterograde flow into the left pulmonary artery, severe pulmonic insufficiency and PFO.  Also with restrictive lung disease, severe scoliosis status post rod placement and diabetes mellitus.  Most recent echocardiogram in 2017 showed normal LV function, moderate to severe right ventricular enlargement with mild RV dysfunction, severe right ventricular hypertrophy, mild right atrial enlargement, mild tricuspid regurgitation, severe pulmonic insufficiency and flattening septum consistent with RV pressure overload.  Patient denies increased dyspnea on exertion, orthopnea, PND, pedal edema, chest pain or syncope.  She does have palpitations which are longstanding described as a skipped and a race.  She is on 3 L of oxygen at home.  Current Outpatient Medications  Medication Sig Dispense Refill   albuterol (VENTOLIN HFA) 108 (90 Base) MCG/ACT inhaler Inhale 1-2 puffs into the lungs every 2 (two) hours as needed for wheezing or shortness of breath (cough). 1 g 3   atorvastatin (LIPITOR) 20 MG tablet Take 1 tablet (20 mg total) by mouth daily. 90 tablet 3   buPROPion (WELLBUTRIN XL) 150 MG 24 hr tablet Take 1 tablet (150 mg total) by mouth daily. 90 tablet 1   busPIRone (BUSPAR) 5 MG tablet Take 1 tablet (5 mg total) by mouth 2 (two) times daily as needed. 60 tablet 0   empagliflozin (JARDIANCE) 25  MG TABS tablet TAKE 1 TABLET BY MOUTH EVERY DAY BEFORE BREAKFAST 90 tablet 3   gabapentin (NEURONTIN) 300 MG capsule Take 300 mg by mouth 2 (two) times daily.     glipiZIDE (GLUCOTROL XL) 10 MG 24 hr tablet TAKE 1 TABLET BY MOUTH EVERY DAY WITH BREAKFAST 30 tablet 0   HYDROcodone-acetaminophen (NORCO) 10-325 MG tablet Take 1 tablet by mouth 5 (five) times daily.     LOW-OGESTREL 0.3-30 MG-MCG tablet TAKE 1 TABLET BY MOUTH EVERY DAY 30 tablet 0   metFORMIN (GLUCOPHAGE) 1000 MG tablet Take 1 tablet (1,000 mg total) by mouth 2 (two) times daily with a meal. 180 tablet 1   OXYGEN Inhale 3-6 L into the lungs continuous. Uses 3L at rest, up to 6L upon exertion     pantoprazole (PROTONIX) 40 MG tablet TAKE 1 TABLET BY MOUTH EVERY DAY 30 tablet 0   sertraline (ZOLOFT) 100 MG tablet TAKE 2 TABLETS BY MOUTH EVERY DAY 180 tablet 3   traZODone (DESYREL) 50 MG tablet Take 1-2 tablets (50-100 mg total) by mouth at bedtime. 90 tablet 1   No current facility-administered medications for this visit.    Allergies  Allergen Reactions   Avelox [Moxifloxacin Hcl In Nacl] Hives and Itching     Past Medical History:  Diagnosis Date   Anxiety    Asthma    As a child   Back pain    Diabetes mellitus ([place])    GERD (gastroesophageal reflux disease)    Heart failure (HCC)    Hyperlipidemia    Pulmonary hypertension (HCC)    Scoliosis deformity of spine  Tetralogy of Fallot     Past Surgical History:  Procedure Laterality Date   BACK SURGERY     Rods for scoliosis   BLALOCK PROCEDURE     3 months   TETRALOGY OF FALLOT REPAIR     Age 88 at [facility]    Social History   Socioeconomic History   Marital status: Single    Spouse name: Not on file   Number of children: 0   Years of education: Not on file   Highest education level: Not on file  Occupational History   Not on file  Tobacco Use   Smoking status: Former    Packs/day: 1.50    Years: 10.00    Pack years: 15.00    Types: Cigarettes     Quit date: 02/02/2011    Years since quitting: 11.0   Smokeless tobacco: Never  Substance and Sexual Activity   Alcohol use: No    Alcohol/week: 0.0 standard drinks   Drug use: No   Sexual activity: Not on file  Other Topics Concern   Not on file  Social History Narrative   Unemployed at present.  Worked as Scientist, water quality at [employer].   HSG, [employer]- a little bit   Single, in a 4 year relationship   No children    Unemployed   No history of abuse   Social Determinants of Radio broadcast assistant Strain: Not on file  Food Insecurity: Not on file  Transportation Needs: Not on file  Physical Activity: Not on file  Stress: Not on file  Social Connections: Not on file  Intimate Partner Violence: Not on file    Family History  Problem Relation Age of Onset   Diabetes Mother    Hypertension Mother    Cancer Mother        uterine cancer   Alcohol abuse Father    Cancer Maternal Aunt        Breast Cancer, Lung cancer    ROS: no fevers or chills, productive cough, hemoptysis, dysphasia, odynophagia, melena, hematochezia, dysuria, hematuria, rash, seizure activity, orthopnea, PND, pedal edema, claudication. Remaining systems are negative.  Physical Exam:   Blood pressure 136/68, pulse 92, height 4' 11.5" (1.511 m), weight 178 lb 12.8 oz (81.1 kg), SpO2 98 %.  General:  Well developed/well nourished in NAD Skin warm/dry Patient not depressed No peripheral clubbing Back-normal HEENT-normal/normal eyelids Neck supple/normal carotid upstroke bilaterally; no bruits; no JVD; no thyromegaly chest - CTA/ normal expansion CV - RRR/normal S1 and S2; no murmurs, rubs or gallops;  PMI nondisplaced Abdomen -NT/ND, no HSM, no mass, + bowel sounds, no bruit 2+ femoral pulses, no bruits Ext-no edema, chords, 2+ DP Neuro-grossly nonfocal  ECG -normal sinus rhythm at a rate of 92, first-degree AV block, normal axis, no ST changes.  Personally reviewed  A/P  1 history of  tetralogy of Fallot repair and absent left pulmonary artery-patient denies increased dyspnea.  I will arrange for her to see echo in adult congenital cardiologist.  2 history of severe pulmonic insufficiency-we will need repeat echocardiogram but I will wait for this to be scheduled by her congenital cardiologist.  3 pulmonary hypertension-patient is on home oxygen.  She denies increased dyspnea.  Question if she will need right heart catheterization.  4 history of chronic respiratory insufficiency secondary to congenital heart disease and scoliosis with restrictive lung disease.  Kirk Ruths, MD

## 2022-02-20 ENCOUNTER — Other Ambulatory Visit: Payer: Self-pay | Admitting: Internal Medicine

## 2022-02-20 MED ORDER — ATORVASTATIN CALCIUM 20 MG PO TABS: 20.0000 mg | ORAL_TABLET | Freq: Every day | ORAL | 3 refills | Status: DC

## 2022-02-20 MED ORDER — METFORMIN HCL 1000 MG PO TABS: 1000.0000 mg | ORAL_TABLET | Freq: Two times a day (BID) | ORAL | 1 refills | Status: DC

## 2022-02-21 ENCOUNTER — Encounter: Payer: Self-pay | Admitting: Cardiology

## 2022-02-21 ENCOUNTER — Other Ambulatory Visit: Payer: Self-pay

## 2022-02-21 ENCOUNTER — Ambulatory Visit (INDEPENDENT_AMBULATORY_CARE_PROVIDER_SITE_OTHER): Payer: Medicare HMO | Admitting: Cardiology

## 2022-02-21 ENCOUNTER — Encounter: Payer: Self-pay | Admitting: *Deleted

## 2022-02-21 VITALS — BP 136/68 | HR 92 | Ht 59.5 in | Wt 178.8 lb

## 2022-02-21 DIAGNOSIS — Z8774 Personal history of (corrected) congenital malformations of heart and circulatory system: Secondary | ICD-10-CM | POA: Diagnosis not present

## 2022-02-21 NOTE — Progress Notes (Signed)
This encounter was created in error - please disregard.

## 2022-02-21 NOTE — Patient Instructions (Signed)
°  Follow-Up: At [facility], you and your health needs are our priority.  As part of our continuing mission to provide you with exceptional heart care, we have created designated Provider Care Teams.  These Care Teams include your primary Cardiologist (physician) and Advanced Practice Providers (APPs -  Physician Assistants and Nurse Practitioners) who all work together to provide you with the care you need, when you need it.  We recommend signing up for the patient portal called "MyChart".  Sign up information is provided on this After Visit Summary.  MyChart is used to connect with patients for Virtual Visits (Telemedicine).  Patients are able to view lab/test results, encounter notes, upcoming appointments, etc.  Non-urgent messages can be sent to your provider as well.   To learn more about what you can do with MyChart, go to [email or web address].    Your next appointment:   As needed  REFERRAL TO DR Delrae Sawyers- [facility]

## 2022-02-28 DIAGNOSIS — M545 Low back pain, unspecified: Secondary | ICD-10-CM | POA: Diagnosis not present

## 2022-02-28 DIAGNOSIS — M25551 Pain in right hip: Secondary | ICD-10-CM | POA: Diagnosis not present

## 2022-02-28 DIAGNOSIS — E119 Type 2 diabetes mellitus without complications: Secondary | ICD-10-CM | POA: Diagnosis not present

## 2022-02-28 DIAGNOSIS — Z6834 Body mass index (BMI) 34.0-34.9, adult: Secondary | ICD-10-CM | POA: Diagnosis not present

## 2022-02-28 DIAGNOSIS — G894 Chronic pain syndrome: Secondary | ICD-10-CM | POA: Diagnosis not present

## 2022-02-28 DIAGNOSIS — Z79899 Other long term (current) drug therapy: Secondary | ICD-10-CM | POA: Diagnosis not present

## 2022-02-28 DIAGNOSIS — G8929 Other chronic pain: Secondary | ICD-10-CM | POA: Diagnosis not present

## 2022-02-28 DIAGNOSIS — R030 Elevated blood-pressure reading, without diagnosis of hypertension: Secondary | ICD-10-CM | POA: Diagnosis not present

## 2022-03-01 ENCOUNTER — Other Ambulatory Visit: Payer: Self-pay | Admitting: Internal Medicine

## 2022-03-01 ENCOUNTER — Ambulatory Visit
Admission: RE | Admit: 2022-03-01 | Discharge: 2022-03-01 | Disposition: A | Discharge status: Home / Self Care | Payer: Medicare HMO | Source: Ambulatory Visit | Attending: Internal Medicine | Admitting: Internal Medicine

## 2022-03-01 DIAGNOSIS — R1084 Generalized abdominal pain: Secondary | ICD-10-CM | POA: Diagnosis not present

## 2022-03-03 ENCOUNTER — Other Ambulatory Visit: Payer: Self-pay | Admitting: Internal Medicine

## 2022-03-03 DIAGNOSIS — R932 Abnormal findings on diagnostic imaging of liver and biliary tract: Secondary | ICD-10-CM

## 2022-03-03 DIAGNOSIS — R7989 Other specified abnormal findings of blood chemistry: Secondary | ICD-10-CM

## 2022-03-04 ENCOUNTER — Other Ambulatory Visit: Payer: Self-pay | Admitting: Internal Medicine

## 2022-03-05 ENCOUNTER — Other Ambulatory Visit: Payer: Self-pay | Admitting: Internal Medicine

## 2022-03-09 DIAGNOSIS — R011 Cardiac murmur, unspecified: Secondary | ICD-10-CM | POA: Diagnosis not present

## 2022-03-09 DIAGNOSIS — J188 Other pneumonia, unspecified organism: Secondary | ICD-10-CM | POA: Diagnosis not present

## 2022-03-22 ENCOUNTER — Institutional Professional Consult (permissible substitution): Payer: Medicare HMO | Admitting: Pulmonary Disease

## 2022-03-30 DIAGNOSIS — M545 Low back pain, unspecified: Secondary | ICD-10-CM | POA: Diagnosis not present

## 2022-03-30 DIAGNOSIS — E119 Type 2 diabetes mellitus without complications: Secondary | ICD-10-CM | POA: Diagnosis not present

## 2022-03-30 DIAGNOSIS — R030 Elevated blood-pressure reading, without diagnosis of hypertension: Secondary | ICD-10-CM | POA: Diagnosis not present

## 2022-03-30 DIAGNOSIS — M25551 Pain in right hip: Secondary | ICD-10-CM | POA: Diagnosis not present

## 2022-03-30 DIAGNOSIS — G894 Chronic pain syndrome: Secondary | ICD-10-CM | POA: Diagnosis not present

## 2022-03-30 DIAGNOSIS — G8929 Other chronic pain: Secondary | ICD-10-CM | POA: Diagnosis not present

## 2022-03-30 DIAGNOSIS — Z79899 Other long term (current) drug therapy: Secondary | ICD-10-CM | POA: Diagnosis not present

## 2022-03-30 DIAGNOSIS — Z6835 Body mass index (BMI) 35.0-35.9, adult: Secondary | ICD-10-CM | POA: Diagnosis not present

## 2022-04-01 ENCOUNTER — Other Ambulatory Visit: Payer: Self-pay | Admitting: Internal Medicine

## 2022-04-09 DIAGNOSIS — J188 Other pneumonia, unspecified organism: Secondary | ICD-10-CM | POA: Diagnosis not present

## 2022-04-09 DIAGNOSIS — R011 Cardiac murmur, unspecified: Secondary | ICD-10-CM | POA: Diagnosis not present

## 2022-04-15 ENCOUNTER — Other Ambulatory Visit: Payer: Self-pay | Admitting: Internal Medicine

## 2022-04-17 ENCOUNTER — Other Ambulatory Visit: Payer: Self-pay | Admitting: Internal Medicine

## 2022-04-17 NOTE — Telephone Encounter (Signed)
Pls advise on refill.../lmb 

## 2022-04-27 DIAGNOSIS — R030 Elevated blood-pressure reading, without diagnosis of hypertension: Secondary | ICD-10-CM | POA: Diagnosis not present

## 2022-04-27 DIAGNOSIS — Z79899 Other long term (current) drug therapy: Secondary | ICD-10-CM | POA: Diagnosis not present

## 2022-04-27 DIAGNOSIS — G8929 Other chronic pain: Secondary | ICD-10-CM | POA: Diagnosis not present

## 2022-04-27 DIAGNOSIS — G894 Chronic pain syndrome: Secondary | ICD-10-CM | POA: Diagnosis not present

## 2022-04-27 DIAGNOSIS — Z6834 Body mass index (BMI) 34.0-34.9, adult: Secondary | ICD-10-CM | POA: Diagnosis not present

## 2022-04-27 DIAGNOSIS — M25551 Pain in right hip: Secondary | ICD-10-CM | POA: Diagnosis not present

## 2022-04-27 DIAGNOSIS — M545 Low back pain, unspecified: Secondary | ICD-10-CM | POA: Diagnosis not present

## 2022-04-27 DIAGNOSIS — E119 Type 2 diabetes mellitus without complications: Secondary | ICD-10-CM | POA: Diagnosis not present

## 2022-05-01 ENCOUNTER — Institutional Professional Consult (permissible substitution): Payer: Medicare HMO | Admitting: Pulmonary Disease

## 2022-05-06 ENCOUNTER — Other Ambulatory Visit: Payer: Self-pay | Admitting: Internal Medicine

## 2022-05-08 ENCOUNTER — Ambulatory Visit (INDEPENDENT_AMBULATORY_CARE_PROVIDER_SITE_OTHER): Payer: Medicare HMO | Admitting: Internal Medicine

## 2022-05-08 ENCOUNTER — Encounter: Payer: Self-pay | Admitting: Internal Medicine

## 2022-05-08 VITALS — BP 126/80 | HR 87 | Resp 18 | Ht 59.5 in | Wt 177.2 lb

## 2022-05-08 DIAGNOSIS — E785 Hyperlipidemia, unspecified: Secondary | ICD-10-CM | POA: Diagnosis not present

## 2022-05-08 DIAGNOSIS — E118 Type 2 diabetes mellitus with unspecified complications: Secondary | ICD-10-CM

## 2022-05-08 DIAGNOSIS — R7989 Other specified abnormal findings of blood chemistry: Secondary | ICD-10-CM | POA: Diagnosis not present

## 2022-05-08 DIAGNOSIS — E1169 Type 2 diabetes mellitus with other specified complication: Secondary | ICD-10-CM | POA: Diagnosis not present

## 2022-05-08 LAB — POCT GLYCOSYLATED HEMOGLOBIN (HGB A1C): Hemoglobin A1C: 8.7 % — AB (ref 4.0–5.6)

## 2022-05-08 NOTE — Assessment & Plan Note (Signed)
She did start taking atorvastatin 20 mg daily after last visit. Will recheck lipid panel in 3 months on return to assess if she is at goal LDL<100.  ?

## 2022-05-08 NOTE — Progress Notes (Signed)
? ?  Subjective:  ? ?Patient ID: [name], female    DOB: 06/27/1986, 36 y.o.   MRN: [id number] ? ?Diabetes ?Hypoglycemia symptoms include nervousness/anxiousness. Pertinent negatives for diabetes include no chest pain.  ?The patient is a 36 YO female coming in for follow up.  ? ?Review of Systems  ?Constitutional:  Positive for activity change.  ?HENT: Negative.    ?Eyes: Negative.   ?Respiratory:  Positive for shortness of breath. Negative for cough and chest tightness.   ?Cardiovascular:  Negative for chest pain, palpitations and leg swelling.  ?Gastrointestinal:  Negative for abdominal distention, abdominal pain, constipation, diarrhea, nausea and vomiting.  ?Musculoskeletal:  Positive for arthralgias and back pain.  ?Skin: Negative.   ?Neurological: Negative.   ?Psychiatric/Behavioral:  Positive for decreased concentration. The patient is nervous/anxious.   ? ?Objective:  ?Physical Exam ?Constitutional:   ?   Appearance: She is well-developed.  ?HENT:  ?   Head: Normocephalic and atraumatic.  ?Cardiovascular:  ?   Rate and Rhythm: Normal rate and regular rhythm.  ?Pulmonary:  ?   Effort: Pulmonary effort is normal. No respiratory distress.  ?   Breath sounds: Normal breath sounds. No wheezing or rales.  ?   Comments: Oxygen in place ?Abdominal:  ?   General: Bowel sounds are normal. There is no distension.  ?   Palpations: Abdomen is soft.  ?   Tenderness: There is no abdominal tenderness. There is no rebound.  ?Musculoskeletal:     ?   General: Tenderness present.  ?   Cervical back: Normal range of motion.  ?Skin: ?   General: Skin is warm and dry.  ?Neurological:  ?   Mental Status: She is alert and oriented to person, place, and time.  ?   Coordination: Coordination normal.  ? ? ?Vitals:  ? 05/08/22 1020  ?BP: 126/80  ?Pulse: 87  ?Resp: 18  ?SpO2: 99%  ?Weight: 177 lb 3.2 oz (80.4 kg)  ?Height: 4' 11.5" (1.511 m)  ? ? ?Assessment & Plan:  ? ?

## 2022-05-08 NOTE — Assessment & Plan Note (Signed)
POC HgA1c done at office. She is unsure if she did increase metformin to 1000 mg BID and will check. If not she will let us know and we will add medication. She is taking glipizide 10 mg daily, jardiance 25 mg daily and metformin 1000 mg BID. POC with moderate exacerbation and stable from prior at 8.7 (goal 7.5).  ?

## 2022-05-08 NOTE — Assessment & Plan Note (Signed)
Due to high LFTs and imaging changes on recent CT referred to GI. Asked her to call to schedule this apt.  ?

## 2022-05-08 NOTE — Patient Instructions (Addendum)
Call the GI doctor to schedule with them. ? ?Your HgA1c is 8.7 so about the same as before. ? ? ?

## 2022-05-09 DIAGNOSIS — J188 Other pneumonia, unspecified organism: Secondary | ICD-10-CM | POA: Diagnosis not present

## 2022-05-09 DIAGNOSIS — R011 Cardiac murmur, unspecified: Secondary | ICD-10-CM | POA: Diagnosis not present

## 2022-05-11 ENCOUNTER — Other Ambulatory Visit: Payer: Self-pay | Admitting: Internal Medicine

## 2022-05-22 ENCOUNTER — Other Ambulatory Visit: Payer: Self-pay | Admitting: Internal Medicine

## 2022-05-25 ENCOUNTER — Other Ambulatory Visit: Payer: Self-pay | Admitting: Internal Medicine

## 2022-05-29 DIAGNOSIS — M545 Low back pain, unspecified: Secondary | ICD-10-CM | POA: Diagnosis not present

## 2022-05-29 DIAGNOSIS — M25551 Pain in right hip: Secondary | ICD-10-CM | POA: Diagnosis not present

## 2022-05-29 DIAGNOSIS — G894 Chronic pain syndrome: Secondary | ICD-10-CM | POA: Diagnosis not present

## 2022-05-29 DIAGNOSIS — E119 Type 2 diabetes mellitus without complications: Secondary | ICD-10-CM | POA: Diagnosis not present

## 2022-05-29 DIAGNOSIS — Z6834 Body mass index (BMI) 34.0-34.9, adult: Secondary | ICD-10-CM | POA: Diagnosis not present

## 2022-05-29 DIAGNOSIS — R030 Elevated blood-pressure reading, without diagnosis of hypertension: Secondary | ICD-10-CM | POA: Diagnosis not present

## 2022-05-29 DIAGNOSIS — Z79899 Other long term (current) drug therapy: Secondary | ICD-10-CM | POA: Diagnosis not present

## 2022-05-29 DIAGNOSIS — G8929 Other chronic pain: Secondary | ICD-10-CM | POA: Diagnosis not present

## 2022-06-09 DIAGNOSIS — R011 Cardiac murmur, unspecified: Secondary | ICD-10-CM | POA: Diagnosis not present

## 2022-06-09 DIAGNOSIS — J188 Other pneumonia, unspecified organism: Secondary | ICD-10-CM | POA: Diagnosis not present

## 2022-06-13 ENCOUNTER — Other Ambulatory Visit: Payer: Self-pay | Admitting: Internal Medicine

## 2022-06-16 ENCOUNTER — Ambulatory Visit: Payer: Medicare HMO | Admitting: Gastroenterology

## 2022-06-29 DIAGNOSIS — E669 Obesity, unspecified: Secondary | ICD-10-CM | POA: Diagnosis not present

## 2022-06-29 DIAGNOSIS — G8929 Other chronic pain: Secondary | ICD-10-CM | POA: Diagnosis not present

## 2022-06-29 DIAGNOSIS — M545 Low back pain, unspecified: Secondary | ICD-10-CM | POA: Diagnosis not present

## 2022-06-29 DIAGNOSIS — M25551 Pain in right hip: Secondary | ICD-10-CM | POA: Diagnosis not present

## 2022-06-29 DIAGNOSIS — E119 Type 2 diabetes mellitus without complications: Secondary | ICD-10-CM | POA: Diagnosis not present

## 2022-06-29 DIAGNOSIS — R030 Elevated blood-pressure reading, without diagnosis of hypertension: Secondary | ICD-10-CM | POA: Diagnosis not present

## 2022-06-29 DIAGNOSIS — Z6833 Body mass index (BMI) 33.0-33.9, adult: Secondary | ICD-10-CM | POA: Diagnosis not present

## 2022-06-29 DIAGNOSIS — G894 Chronic pain syndrome: Secondary | ICD-10-CM | POA: Diagnosis not present

## 2022-06-29 DIAGNOSIS — Z79899 Other long term (current) drug therapy: Secondary | ICD-10-CM | POA: Diagnosis not present

## 2022-06-29 DIAGNOSIS — R69 Illness, unspecified: Secondary | ICD-10-CM | POA: Diagnosis not present

## 2022-07-07 ENCOUNTER — Other Ambulatory Visit: Payer: Self-pay | Admitting: Internal Medicine

## 2022-07-11 ENCOUNTER — Other Ambulatory Visit: Payer: Self-pay | Admitting: Internal Medicine

## 2022-07-22 ENCOUNTER — Other Ambulatory Visit: Payer: Self-pay | Admitting: Internal Medicine

## 2022-07-25 DIAGNOSIS — Z6834 Body mass index (BMI) 34.0-34.9, adult: Secondary | ICD-10-CM | POA: Diagnosis not present

## 2022-07-25 DIAGNOSIS — M25551 Pain in right hip: Secondary | ICD-10-CM | POA: Diagnosis not present

## 2022-07-25 DIAGNOSIS — E669 Obesity, unspecified: Secondary | ICD-10-CM | POA: Diagnosis not present

## 2022-07-25 DIAGNOSIS — E119 Type 2 diabetes mellitus without complications: Secondary | ICD-10-CM | POA: Diagnosis not present

## 2022-07-25 DIAGNOSIS — G894 Chronic pain syndrome: Secondary | ICD-10-CM | POA: Diagnosis not present

## 2022-07-25 DIAGNOSIS — R030 Elevated blood-pressure reading, without diagnosis of hypertension: Secondary | ICD-10-CM | POA: Diagnosis not present

## 2022-07-25 DIAGNOSIS — R011 Cardiac murmur, unspecified: Secondary | ICD-10-CM | POA: Diagnosis not present

## 2022-07-25 DIAGNOSIS — G8929 Other chronic pain: Secondary | ICD-10-CM | POA: Diagnosis not present

## 2022-07-25 DIAGNOSIS — J188 Other pneumonia, unspecified organism: Secondary | ICD-10-CM | POA: Diagnosis not present

## 2022-07-25 DIAGNOSIS — M545 Low back pain, unspecified: Secondary | ICD-10-CM | POA: Diagnosis not present

## 2022-07-25 DIAGNOSIS — Z79899 Other long term (current) drug therapy: Secondary | ICD-10-CM | POA: Diagnosis not present

## 2022-07-25 DIAGNOSIS — R69 Illness, unspecified: Secondary | ICD-10-CM | POA: Diagnosis not present

## 2022-07-28 DIAGNOSIS — Z79899 Other long term (current) drug therapy: Secondary | ICD-10-CM | POA: Diagnosis not present

## 2022-08-02 ENCOUNTER — Other Ambulatory Visit: Payer: Self-pay | Admitting: Internal Medicine

## 2022-08-07 ENCOUNTER — Ambulatory Visit: Payer: Self-pay | Admitting: Licensed Clinical Social Worker

## 2022-08-07 NOTE — Patient Instructions (Signed)
   It was a pleasure speaking with you today. per your request your phone appointment is scheduled Aug. 23rd at 3:00  Please call the care guide team at [phone] if you need to cancel or reschedule your phone appointment with me.   Sammuel Hines, LCSW Care Coordination  [facility] [phone]

## 2022-08-07 NOTE — Patient Outreach (Signed)
  Care Coordination   08/07/2022 Name: STEPHANIE LITTMAN MRN: [id number] DOB: 1985/12/28   Care Coordination Outreach Attempts:  Contact was made with the patient today to offer care coordination services as a benefit of their health plan. The patient requested a return call on a later date.   Follow Up Plan:  Additional outreach attempts will be made to offer the patient care coordination information and services.  Phone appointment scheduled 08/16/2022 for needs assessment.   Encounter Outcome:  Pt. Scheduled  Care Coordination Interventions Activated:  No   Care Coordination Interventions:  No, not indicated    Sammuel Hines, [facility] Care Coordination  [facility] [phone]

## 2022-08-16 ENCOUNTER — Ambulatory Visit: Payer: Self-pay | Admitting: Licensed Clinical Social Worker

## 2022-08-16 NOTE — Patient Instructions (Signed)
     I am sorry you were unable to keep your phone appointment today.   Please call me directly to reschedule so that we can share information our care coordination team.  Sammuel Hines, [facility] Care Coordination  [facility] [phone]
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
## 2022-08-25 ENCOUNTER — Other Ambulatory Visit: Payer: Self-pay | Admitting: Internal Medicine

## 2022-08-25 DIAGNOSIS — G894 Chronic pain syndrome: Secondary | ICD-10-CM | POA: Diagnosis not present

## 2022-08-25 DIAGNOSIS — Z79899 Other long term (current) drug therapy: Secondary | ICD-10-CM | POA: Diagnosis not present

## 2022-08-25 DIAGNOSIS — R030 Elevated blood-pressure reading, without diagnosis of hypertension: Secondary | ICD-10-CM | POA: Diagnosis not present

## 2022-08-25 DIAGNOSIS — M545 Low back pain, unspecified: Secondary | ICD-10-CM | POA: Diagnosis not present

## 2022-08-25 DIAGNOSIS — G8929 Other chronic pain: Secondary | ICD-10-CM | POA: Diagnosis not present

## 2022-08-25 DIAGNOSIS — R69 Illness, unspecified: Secondary | ICD-10-CM | POA: Diagnosis not present

## 2022-08-25 DIAGNOSIS — M25551 Pain in right hip: Secondary | ICD-10-CM | POA: Diagnosis not present

## 2022-08-25 DIAGNOSIS — E119 Type 2 diabetes mellitus without complications: Secondary | ICD-10-CM | POA: Diagnosis not present

## 2022-08-25 DIAGNOSIS — Z6834 Body mass index (BMI) 34.0-34.9, adult: Secondary | ICD-10-CM | POA: Diagnosis not present

## 2022-08-25 DIAGNOSIS — E669 Obesity, unspecified: Secondary | ICD-10-CM | POA: Diagnosis not present

## 2022-09-09 DIAGNOSIS — J188 Other pneumonia, unspecified organism: Secondary | ICD-10-CM | POA: Diagnosis not present

## 2022-09-09 DIAGNOSIS — R011 Cardiac murmur, unspecified: Secondary | ICD-10-CM | POA: Diagnosis not present

## 2022-09-12 ENCOUNTER — Other Ambulatory Visit: Payer: Self-pay | Admitting: Internal Medicine

## 2022-09-21 ENCOUNTER — Telehealth: Payer: Self-pay

## 2022-09-21 NOTE — Telephone Encounter (Signed)
MEDICATION: BASAGLAR INSULIN 30 Units   PHARMACY: [id number] - [place], [place].  Comments: Patient states that her last refill landed on a weekend and called the afterhours line, they advised that insurance was denying it so the levamir flexpen so they switched it to the [employer]   **Let patient know to contact pharmacy at the end of the day to make sure medication is ready. **  ** Please notify patient to allow 48-72 hours to process**  **Encourage patient to contact the pharmacy for refills or they can request refills through [facility]**

## 2022-09-25 DIAGNOSIS — G8929 Other chronic pain: Secondary | ICD-10-CM | POA: Diagnosis not present

## 2022-09-25 DIAGNOSIS — Z79899 Other long term (current) drug therapy: Secondary | ICD-10-CM | POA: Diagnosis not present

## 2022-09-25 DIAGNOSIS — Z6834 Body mass index (BMI) 34.0-34.9, adult: Secondary | ICD-10-CM | POA: Diagnosis not present

## 2022-09-25 DIAGNOSIS — G894 Chronic pain syndrome: Secondary | ICD-10-CM | POA: Diagnosis not present

## 2022-09-25 DIAGNOSIS — M545 Low back pain, unspecified: Secondary | ICD-10-CM | POA: Diagnosis not present

## 2022-09-25 DIAGNOSIS — E669 Obesity, unspecified: Secondary | ICD-10-CM | POA: Diagnosis not present

## 2022-09-25 DIAGNOSIS — R030 Elevated blood-pressure reading, without diagnosis of hypertension: Secondary | ICD-10-CM | POA: Diagnosis not present

## 2022-09-25 DIAGNOSIS — M25551 Pain in right hip: Secondary | ICD-10-CM | POA: Diagnosis not present

## 2022-09-25 DIAGNOSIS — R69 Illness, unspecified: Secondary | ICD-10-CM | POA: Diagnosis not present

## 2022-10-09 DIAGNOSIS — R011 Cardiac murmur, unspecified: Secondary | ICD-10-CM | POA: Diagnosis not present

## 2022-10-09 DIAGNOSIS — J188 Other pneumonia, unspecified organism: Secondary | ICD-10-CM | POA: Diagnosis not present

## 2022-10-19 ENCOUNTER — Other Ambulatory Visit: Payer: Self-pay | Admitting: Internal Medicine

## 2022-10-19 NOTE — Telephone Encounter (Signed)
Needs visit for further refills as overdue

## 2022-10-20 ENCOUNTER — Other Ambulatory Visit: Payer: Self-pay | Admitting: Internal Medicine

## 2022-10-30 ENCOUNTER — Other Ambulatory Visit: Payer: Self-pay | Admitting: Internal Medicine

## 2022-10-30 DIAGNOSIS — R030 Elevated blood-pressure reading, without diagnosis of hypertension: Secondary | ICD-10-CM | POA: Diagnosis not present

## 2022-10-30 DIAGNOSIS — M25551 Pain in right hip: Secondary | ICD-10-CM | POA: Diagnosis not present

## 2022-10-30 DIAGNOSIS — G8929 Other chronic pain: Secondary | ICD-10-CM | POA: Diagnosis not present

## 2022-10-30 DIAGNOSIS — G894 Chronic pain syndrome: Secondary | ICD-10-CM | POA: Diagnosis not present

## 2022-10-30 DIAGNOSIS — Z6834 Body mass index (BMI) 34.0-34.9, adult: Secondary | ICD-10-CM | POA: Diagnosis not present

## 2022-10-30 DIAGNOSIS — E669 Obesity, unspecified: Secondary | ICD-10-CM | POA: Diagnosis not present

## 2022-10-30 DIAGNOSIS — R69 Illness, unspecified: Secondary | ICD-10-CM | POA: Diagnosis not present

## 2022-10-30 DIAGNOSIS — M545 Low back pain, unspecified: Secondary | ICD-10-CM | POA: Diagnosis not present

## 2022-11-09 DIAGNOSIS — R011 Cardiac murmur, unspecified: Secondary | ICD-10-CM | POA: Diagnosis not present

## 2022-11-09 DIAGNOSIS — J188 Other pneumonia, unspecified organism: Secondary | ICD-10-CM | POA: Diagnosis not present

## 2022-11-15 DIAGNOSIS — M25551 Pain in right hip: Secondary | ICD-10-CM | POA: Diagnosis not present

## 2022-11-15 DIAGNOSIS — G894 Chronic pain syndrome: Secondary | ICD-10-CM | POA: Diagnosis not present

## 2022-11-15 DIAGNOSIS — R69 Illness, unspecified: Secondary | ICD-10-CM | POA: Diagnosis not present

## 2022-11-15 DIAGNOSIS — Z79899 Other long term (current) drug therapy: Secondary | ICD-10-CM | POA: Diagnosis not present

## 2022-11-15 DIAGNOSIS — M545 Low back pain, unspecified: Secondary | ICD-10-CM | POA: Diagnosis not present

## 2022-11-15 DIAGNOSIS — R030 Elevated blood-pressure reading, without diagnosis of hypertension: Secondary | ICD-10-CM | POA: Diagnosis not present

## 2022-11-15 DIAGNOSIS — Z6834 Body mass index (BMI) 34.0-34.9, adult: Secondary | ICD-10-CM | POA: Diagnosis not present

## 2022-11-15 DIAGNOSIS — G8929 Other chronic pain: Secondary | ICD-10-CM | POA: Diagnosis not present

## 2022-11-15 DIAGNOSIS — E669 Obesity, unspecified: Secondary | ICD-10-CM | POA: Diagnosis not present

## 2022-11-21 ENCOUNTER — Other Ambulatory Visit: Payer: Self-pay | Admitting: Internal Medicine

## 2022-11-25 ENCOUNTER — Other Ambulatory Visit: Payer: Self-pay | Admitting: Internal Medicine

## 2022-11-27 NOTE — Telephone Encounter (Signed)
This medication was already declined due to needing apt

## 2022-12-02 ENCOUNTER — Other Ambulatory Visit: Payer: Self-pay | Admitting: Internal Medicine

## 2022-12-09 DIAGNOSIS — R011 Cardiac murmur, unspecified: Secondary | ICD-10-CM | POA: Diagnosis not present

## 2022-12-09 DIAGNOSIS — J188 Other pneumonia, unspecified organism: Secondary | ICD-10-CM | POA: Diagnosis not present

## 2022-12-13 DIAGNOSIS — E669 Obesity, unspecified: Secondary | ICD-10-CM | POA: Diagnosis not present

## 2022-12-13 DIAGNOSIS — Z79899 Other long term (current) drug therapy: Secondary | ICD-10-CM | POA: Diagnosis not present

## 2022-12-13 DIAGNOSIS — M25551 Pain in right hip: Secondary | ICD-10-CM | POA: Diagnosis not present

## 2022-12-13 DIAGNOSIS — G894 Chronic pain syndrome: Secondary | ICD-10-CM | POA: Diagnosis not present

## 2022-12-13 DIAGNOSIS — M545 Low back pain, unspecified: Secondary | ICD-10-CM | POA: Diagnosis not present

## 2022-12-13 DIAGNOSIS — G8929 Other chronic pain: Secondary | ICD-10-CM | POA: Diagnosis not present

## 2022-12-13 DIAGNOSIS — R030 Elevated blood-pressure reading, without diagnosis of hypertension: Secondary | ICD-10-CM | POA: Diagnosis not present

## 2022-12-13 DIAGNOSIS — R69 Illness, unspecified: Secondary | ICD-10-CM | POA: Diagnosis not present

## 2022-12-13 DIAGNOSIS — Z6833 Body mass index (BMI) 33.0-33.9, adult: Secondary | ICD-10-CM | POA: Diagnosis not present

## 2023-01-09 DIAGNOSIS — J188 Other pneumonia, unspecified organism: Secondary | ICD-10-CM | POA: Diagnosis not present

## 2023-01-09 DIAGNOSIS — R011 Cardiac murmur, unspecified: Secondary | ICD-10-CM | POA: Diagnosis not present

## 2023-01-15 DIAGNOSIS — M545 Low back pain, unspecified: Secondary | ICD-10-CM | POA: Diagnosis not present

## 2023-01-15 DIAGNOSIS — E669 Obesity, unspecified: Secondary | ICD-10-CM | POA: Diagnosis not present

## 2023-01-15 DIAGNOSIS — G8929 Other chronic pain: Secondary | ICD-10-CM | POA: Diagnosis not present

## 2023-01-15 DIAGNOSIS — G894 Chronic pain syndrome: Secondary | ICD-10-CM | POA: Diagnosis not present

## 2023-01-15 DIAGNOSIS — Z6834 Body mass index (BMI) 34.0-34.9, adult: Secondary | ICD-10-CM | POA: Diagnosis not present

## 2023-01-15 DIAGNOSIS — R69 Illness, unspecified: Secondary | ICD-10-CM | POA: Diagnosis not present

## 2023-01-15 DIAGNOSIS — M25551 Pain in right hip: Secondary | ICD-10-CM | POA: Diagnosis not present

## 2023-01-15 DIAGNOSIS — R030 Elevated blood-pressure reading, without diagnosis of hypertension: Secondary | ICD-10-CM | POA: Diagnosis not present

## 2023-01-15 DIAGNOSIS — E119 Type 2 diabetes mellitus without complications: Secondary | ICD-10-CM | POA: Diagnosis not present

## 2023-01-15 DIAGNOSIS — Z79899 Other long term (current) drug therapy: Secondary | ICD-10-CM | POA: Diagnosis not present

## 2023-02-09 ENCOUNTER — Telehealth: Payer: Self-pay | Admitting: Internal Medicine

## 2023-02-09 DIAGNOSIS — R011 Cardiac murmur, unspecified: Secondary | ICD-10-CM | POA: Diagnosis not present

## 2023-02-09 DIAGNOSIS — J188 Other pneumonia, unspecified organism: Secondary | ICD-10-CM | POA: Diagnosis not present

## 2023-02-09 NOTE — Telephone Encounter (Signed)
Called patient to schedule Medicare Annual Wellness Visit (AWV). Left message for patient to call back and schedule Medicare Annual Wellness Visit (AWV).  *due 04/25/2015 awvi per [place]  Please schedule an appointment at any time with Loma Sousa - Nurse schedule 2.  If any questions, please contact me at [phone].  Thank you ,  Thank you,  [place] ??[id number]

## 2023-02-14 DIAGNOSIS — M25551 Pain in right hip: Secondary | ICD-10-CM | POA: Diagnosis not present

## 2023-02-14 DIAGNOSIS — R030 Elevated blood-pressure reading, without diagnosis of hypertension: Secondary | ICD-10-CM | POA: Diagnosis not present

## 2023-02-14 DIAGNOSIS — G894 Chronic pain syndrome: Secondary | ICD-10-CM | POA: Diagnosis not present

## 2023-02-14 DIAGNOSIS — E119 Type 2 diabetes mellitus without complications: Secondary | ICD-10-CM | POA: Diagnosis not present

## 2023-02-14 DIAGNOSIS — R69 Illness, unspecified: Secondary | ICD-10-CM | POA: Diagnosis not present

## 2023-02-14 DIAGNOSIS — G8929 Other chronic pain: Secondary | ICD-10-CM | POA: Diagnosis not present

## 2023-02-14 DIAGNOSIS — Z6833 Body mass index (BMI) 33.0-33.9, adult: Secondary | ICD-10-CM | POA: Diagnosis not present

## 2023-02-14 DIAGNOSIS — E669 Obesity, unspecified: Secondary | ICD-10-CM | POA: Diagnosis not present

## 2023-02-14 DIAGNOSIS — Z79899 Other long term (current) drug therapy: Secondary | ICD-10-CM | POA: Diagnosis not present

## 2023-02-14 DIAGNOSIS — M545 Low back pain, unspecified: Secondary | ICD-10-CM | POA: Diagnosis not present

## 2023-03-02 ENCOUNTER — Other Ambulatory Visit: Payer: Self-pay | Admitting: Internal Medicine

## 2023-03-10 DIAGNOSIS — R011 Cardiac murmur, unspecified: Secondary | ICD-10-CM | POA: Diagnosis not present

## 2023-03-10 DIAGNOSIS — J188 Other pneumonia, unspecified organism: Secondary | ICD-10-CM | POA: Diagnosis not present

## 2023-03-16 DIAGNOSIS — R030 Elevated blood-pressure reading, without diagnosis of hypertension: Secondary | ICD-10-CM | POA: Diagnosis not present

## 2023-03-16 DIAGNOSIS — Z6834 Body mass index (BMI) 34.0-34.9, adult: Secondary | ICD-10-CM | POA: Diagnosis not present

## 2023-03-16 DIAGNOSIS — G894 Chronic pain syndrome: Secondary | ICD-10-CM | POA: Diagnosis not present

## 2023-03-16 DIAGNOSIS — R69 Illness, unspecified: Secondary | ICD-10-CM | POA: Diagnosis not present

## 2023-03-16 DIAGNOSIS — E119 Type 2 diabetes mellitus without complications: Secondary | ICD-10-CM | POA: Diagnosis not present

## 2023-03-16 DIAGNOSIS — E669 Obesity, unspecified: Secondary | ICD-10-CM | POA: Diagnosis not present

## 2023-03-16 DIAGNOSIS — M25551 Pain in right hip: Secondary | ICD-10-CM | POA: Diagnosis not present

## 2023-03-16 DIAGNOSIS — Z79899 Other long term (current) drug therapy: Secondary | ICD-10-CM | POA: Diagnosis not present

## 2023-03-16 DIAGNOSIS — M545 Low back pain, unspecified: Secondary | ICD-10-CM | POA: Diagnosis not present

## 2023-03-16 DIAGNOSIS — G8929 Other chronic pain: Secondary | ICD-10-CM | POA: Diagnosis not present

## 2023-03-22 ENCOUNTER — Other Ambulatory Visit: Payer: Self-pay | Admitting: Internal Medicine

## 2023-04-03 ENCOUNTER — Other Ambulatory Visit: Payer: Self-pay | Admitting: Internal Medicine

## 2023-04-10 DIAGNOSIS — R011 Cardiac murmur, unspecified: Secondary | ICD-10-CM | POA: Diagnosis not present

## 2023-04-10 DIAGNOSIS — J188 Other pneumonia, unspecified organism: Secondary | ICD-10-CM | POA: Diagnosis not present

## 2023-04-17 DIAGNOSIS — R030 Elevated blood-pressure reading, without diagnosis of hypertension: Secondary | ICD-10-CM | POA: Diagnosis not present

## 2023-04-17 DIAGNOSIS — M25551 Pain in right hip: Secondary | ICD-10-CM | POA: Diagnosis not present

## 2023-04-17 DIAGNOSIS — R69 Illness, unspecified: Secondary | ICD-10-CM | POA: Diagnosis not present

## 2023-04-17 DIAGNOSIS — M545 Low back pain, unspecified: Secondary | ICD-10-CM | POA: Diagnosis not present

## 2023-04-17 DIAGNOSIS — E669 Obesity, unspecified: Secondary | ICD-10-CM | POA: Diagnosis not present

## 2023-04-17 DIAGNOSIS — Z6834 Body mass index (BMI) 34.0-34.9, adult: Secondary | ICD-10-CM | POA: Diagnosis not present

## 2023-04-17 DIAGNOSIS — E119 Type 2 diabetes mellitus without complications: Secondary | ICD-10-CM | POA: Diagnosis not present

## 2023-04-17 DIAGNOSIS — G8929 Other chronic pain: Secondary | ICD-10-CM | POA: Diagnosis not present

## 2023-04-17 DIAGNOSIS — Z79899 Other long term (current) drug therapy: Secondary | ICD-10-CM | POA: Diagnosis not present

## 2023-04-24 ENCOUNTER — Other Ambulatory Visit: Payer: Self-pay | Admitting: Internal Medicine

## 2023-04-29 ENCOUNTER — Other Ambulatory Visit: Payer: Self-pay | Admitting: Internal Medicine

## 2023-04-30 ENCOUNTER — Other Ambulatory Visit: Payer: Self-pay | Admitting: Internal Medicine

## 2023-05-02 ENCOUNTER — Telehealth: Payer: Self-pay | Admitting: Internal Medicine

## 2023-05-02 NOTE — Telephone Encounter (Signed)
Contacted [name] to schedule their annual wellness visit. Patient declined to schedule AWV at this time. Will have patient return my call to [phone].  [facility] Care Guide [facility] AWV TEAM Direct Dial: [phone]

## 2023-05-07 ENCOUNTER — Telehealth: Payer: Self-pay | Admitting: Internal Medicine

## 2023-05-07 NOTE — Telephone Encounter (Signed)
Called patient to schedule Medicare Annual Wellness Visit (AWV). Left message for patient to call back and schedule Medicare Annual Wellness Visit (AWV).  DUE AWV-I: 04/25/2015  Please schedule an appointment at any time with [facility].  If any questions, please contact me at [phone].  Thank you ,  Randon Goldsmith Care Guide [facility] AWV TEAM Direct Dial: [phone]

## 2023-05-10 DIAGNOSIS — J188 Other pneumonia, unspecified organism: Secondary | ICD-10-CM | POA: Diagnosis not present

## 2023-05-10 DIAGNOSIS — R011 Cardiac murmur, unspecified: Secondary | ICD-10-CM | POA: Diagnosis not present

## 2023-05-17 DIAGNOSIS — M25551 Pain in right hip: Secondary | ICD-10-CM | POA: Diagnosis not present

## 2023-05-17 DIAGNOSIS — E119 Type 2 diabetes mellitus without complications: Secondary | ICD-10-CM | POA: Diagnosis not present

## 2023-05-17 DIAGNOSIS — M545 Low back pain, unspecified: Secondary | ICD-10-CM | POA: Diagnosis not present

## 2023-05-17 DIAGNOSIS — E669 Obesity, unspecified: Secondary | ICD-10-CM | POA: Diagnosis not present

## 2023-05-17 DIAGNOSIS — F1120 Opioid dependence, uncomplicated: Secondary | ICD-10-CM | POA: Diagnosis not present

## 2023-05-17 DIAGNOSIS — G8929 Other chronic pain: Secondary | ICD-10-CM | POA: Diagnosis not present

## 2023-05-17 DIAGNOSIS — Z79899 Other long term (current) drug therapy: Secondary | ICD-10-CM | POA: Diagnosis not present

## 2023-05-17 DIAGNOSIS — Z6833 Body mass index (BMI) 33.0-33.9, adult: Secondary | ICD-10-CM | POA: Diagnosis not present

## 2023-05-19 ENCOUNTER — Other Ambulatory Visit: Payer: Self-pay | Admitting: Internal Medicine

## 2023-06-10 DIAGNOSIS — J188 Other pneumonia, unspecified organism: Secondary | ICD-10-CM | POA: Diagnosis not present

## 2023-06-10 DIAGNOSIS — R011 Cardiac murmur, unspecified: Secondary | ICD-10-CM | POA: Diagnosis not present

## 2023-06-18 DIAGNOSIS — Z79899 Other long term (current) drug therapy: Secondary | ICD-10-CM | POA: Diagnosis not present

## 2023-06-18 DIAGNOSIS — Z6834 Body mass index (BMI) 34.0-34.9, adult: Secondary | ICD-10-CM | POA: Diagnosis not present

## 2023-06-18 DIAGNOSIS — M25551 Pain in right hip: Secondary | ICD-10-CM | POA: Diagnosis not present

## 2023-06-18 DIAGNOSIS — E119 Type 2 diabetes mellitus without complications: Secondary | ICD-10-CM | POA: Diagnosis not present

## 2023-06-18 DIAGNOSIS — M545 Low back pain, unspecified: Secondary | ICD-10-CM | POA: Diagnosis not present

## 2023-06-18 DIAGNOSIS — G8929 Other chronic pain: Secondary | ICD-10-CM | POA: Diagnosis not present

## 2023-06-18 DIAGNOSIS — E669 Obesity, unspecified: Secondary | ICD-10-CM | POA: Diagnosis not present

## 2023-06-18 DIAGNOSIS — M419 Scoliosis, unspecified: Secondary | ICD-10-CM | POA: Diagnosis not present

## 2023-06-18 DIAGNOSIS — F1120 Opioid dependence, uncomplicated: Secondary | ICD-10-CM | POA: Diagnosis not present

## 2023-06-20 ENCOUNTER — Other Ambulatory Visit: Payer: Self-pay | Admitting: Internal Medicine

## 2023-07-10 DIAGNOSIS — J188 Other pneumonia, unspecified organism: Secondary | ICD-10-CM | POA: Diagnosis not present

## 2023-07-10 DIAGNOSIS — R011 Cardiac murmur, unspecified: Secondary | ICD-10-CM | POA: Diagnosis not present

## 2023-07-13 ENCOUNTER — Encounter: Payer: Self-pay | Admitting: *Deleted

## 2023-07-13 ENCOUNTER — Telehealth: Payer: Self-pay | Admitting: *Deleted

## 2023-07-13 NOTE — Telephone Encounter (Signed)
I attempted to contact patient by telephone but was unsuccessful. According to the patient's chart they are due for follow up  with [facility]. I have left a HIPAA compliant message advising the patient to contact [facility] at [phone]. I will continue to follow up with the patient to make sure this appointment is scheduled.

## 2023-07-17 DIAGNOSIS — E6609 Other obesity due to excess calories: Secondary | ICD-10-CM | POA: Diagnosis not present

## 2023-07-17 DIAGNOSIS — G894 Chronic pain syndrome: Secondary | ICD-10-CM | POA: Diagnosis not present

## 2023-07-17 DIAGNOSIS — M25551 Pain in right hip: Secondary | ICD-10-CM | POA: Diagnosis not present

## 2023-07-17 DIAGNOSIS — M545 Low back pain, unspecified: Secondary | ICD-10-CM | POA: Diagnosis not present

## 2023-07-17 DIAGNOSIS — M419 Scoliosis, unspecified: Secondary | ICD-10-CM | POA: Diagnosis not present

## 2023-07-17 DIAGNOSIS — Z79899 Other long term (current) drug therapy: Secondary | ICD-10-CM | POA: Diagnosis not present

## 2023-07-17 DIAGNOSIS — Z6836 Body mass index (BMI) 36.0-36.9, adult: Secondary | ICD-10-CM | POA: Diagnosis not present

## 2023-07-17 DIAGNOSIS — F1120 Opioid dependence, uncomplicated: Secondary | ICD-10-CM | POA: Diagnosis not present

## 2023-07-17 DIAGNOSIS — E119 Type 2 diabetes mellitus without complications: Secondary | ICD-10-CM | POA: Diagnosis not present

## 2023-07-19 NOTE — Telephone Encounter (Signed)
I attempted to contact patient by telephone but was unsuccessful. According to the patient's chart they are due for follow up with  [facility]. I have left a HIPAA compliant message advising the patient to contact [facility] at [phone]. I will continue to follow up with the patient to make sure this appointment is scheduled.

## 2023-07-25 ENCOUNTER — Other Ambulatory Visit: Payer: Self-pay | Admitting: Internal Medicine

## 2023-08-10 DIAGNOSIS — R011 Cardiac murmur, unspecified: Secondary | ICD-10-CM | POA: Diagnosis not present

## 2023-08-10 DIAGNOSIS — J188 Other pneumonia, unspecified organism: Secondary | ICD-10-CM | POA: Diagnosis not present

## 2023-08-12 ENCOUNTER — Other Ambulatory Visit: Payer: Self-pay | Admitting: Internal Medicine

## 2023-08-16 ENCOUNTER — Encounter (HOSPITAL_BASED_OUTPATIENT_CLINIC_OR_DEPARTMENT_OTHER): Payer: Self-pay

## 2023-08-16 ENCOUNTER — Other Ambulatory Visit: Payer: Self-pay

## 2023-08-16 ENCOUNTER — Emergency Department (HOSPITAL_BASED_OUTPATIENT_CLINIC_OR_DEPARTMENT_OTHER)
Admission: EM | Admit: 2023-08-16 | Discharge: 2023-08-16 | Disposition: A | Discharge status: Home / Self Care | Payer: Medicare HMO | Attending: Emergency Medicine | Admitting: Emergency Medicine

## 2023-08-16 ENCOUNTER — Other Ambulatory Visit (HOSPITAL_BASED_OUTPATIENT_CLINIC_OR_DEPARTMENT_OTHER): Payer: Self-pay

## 2023-08-16 DIAGNOSIS — D72829 Elevated white blood cell count, unspecified: Secondary | ICD-10-CM | POA: Insufficient documentation

## 2023-08-16 DIAGNOSIS — R000 Tachycardia, unspecified: Secondary | ICD-10-CM | POA: Insufficient documentation

## 2023-08-16 DIAGNOSIS — F1120 Opioid dependence, uncomplicated: Secondary | ICD-10-CM | POA: Diagnosis not present

## 2023-08-16 DIAGNOSIS — Z79899 Other long term (current) drug therapy: Secondary | ICD-10-CM | POA: Diagnosis not present

## 2023-08-16 DIAGNOSIS — E1165 Type 2 diabetes mellitus with hyperglycemia: Secondary | ICD-10-CM | POA: Insufficient documentation

## 2023-08-16 DIAGNOSIS — E6609 Other obesity due to excess calories: Secondary | ICD-10-CM | POA: Diagnosis not present

## 2023-08-16 DIAGNOSIS — M545 Low back pain, unspecified: Secondary | ICD-10-CM | POA: Diagnosis not present

## 2023-08-16 DIAGNOSIS — R739 Hyperglycemia, unspecified: Secondary | ICD-10-CM

## 2023-08-16 DIAGNOSIS — M25551 Pain in right hip: Secondary | ICD-10-CM | POA: Diagnosis not present

## 2023-08-16 DIAGNOSIS — E878 Other disorders of electrolyte and fluid balance, not elsewhere classified: Secondary | ICD-10-CM | POA: Diagnosis not present

## 2023-08-16 DIAGNOSIS — R030 Elevated blood-pressure reading, without diagnosis of hypertension: Secondary | ICD-10-CM | POA: Diagnosis not present

## 2023-08-16 DIAGNOSIS — E119 Type 2 diabetes mellitus without complications: Secondary | ICD-10-CM | POA: Diagnosis not present

## 2023-08-16 DIAGNOSIS — M419 Scoliosis, unspecified: Secondary | ICD-10-CM | POA: Diagnosis not present

## 2023-08-16 DIAGNOSIS — Z6836 Body mass index (BMI) 36.0-36.9, adult: Secondary | ICD-10-CM | POA: Diagnosis not present

## 2023-08-16 DIAGNOSIS — G894 Chronic pain syndrome: Secondary | ICD-10-CM | POA: Diagnosis not present

## 2023-08-16 DIAGNOSIS — Z7984 Long term (current) use of oral hypoglycemic drugs: Secondary | ICD-10-CM | POA: Insufficient documentation

## 2023-08-16 LAB — I-STAT VENOUS BLOOD GAS, ED
Acid-Base Excess: 6 mmol/L — ABNORMAL HIGH (ref 0.0–2.0)
Bicarbonate: 33.6 mmol/L — ABNORMAL HIGH (ref 20.0–28.0)
Calcium, Ion: 1.19 mmol/L (ref 1.15–1.40)
HCT: 41 % (ref 36.0–46.0)
Hemoglobin: 13.9 g/dL (ref 12.0–15.0)
O2 Saturation: 74 %
Potassium: 4.2 mmol/L (ref 3.5–5.1)
Sodium: 136 mmol/L (ref 135–145)
TCO2: 35 mmol/L — ABNORMAL HIGH (ref 22–32)
pCO2, Ven: 59.8 mmHg (ref 44–60)
pH, Ven: 7.358 (ref 7.25–7.43)
pO2, Ven: 42 mmHg (ref 32–45)

## 2023-08-16 LAB — URINALYSIS, ROUTINE W REFLEX MICROSCOPIC
Bacteria, UA: NONE SEEN
Bilirubin Urine: NEGATIVE
Glucose, UA: >1000 mg/dL — AB
Ketones, ur: NEGATIVE mg/dL
Leukocytes,Ua: NEGATIVE
Nitrite: NEGATIVE
Protein, ur: 30 mg/dL — AB
Specific Gravity, Urine: 1.028 (ref 1.005–1.030)
pH: 5.5 (ref 5.0–8.0)

## 2023-08-16 LAB — BASIC METABOLIC PANEL
Anion gap: 11 (ref 5–15)
BUN: 12 mg/dL (ref 6–20)
CO2: 30 mmol/L (ref 22–32)
Calcium: 9.3 mg/dL (ref 8.9–10.3)
Chloride: 94 mmol/L — ABNORMAL LOW (ref 98–111)
Creatinine, Ser: 0.66 mg/dL (ref 0.44–1.00)
GFR, Estimated: >60 mL/min (ref >60)
Glucose, Bld: 434 mg/dL — ABNORMAL HIGH (ref 70–99)
Potassium: 4.2 mmol/L (ref 3.5–5.1)
Sodium: 135 mmol/L (ref 135–145)

## 2023-08-16 LAB — CBC
HCT: 40.7 % (ref 36.0–46.0)
Hemoglobin: 13.3 g/dL (ref 12.0–15.0)
MCH: 28.5 pg (ref 26.0–34.0)
MCHC: 32.7 g/dL (ref 30.0–36.0)
MCV: 87.3 fL (ref 80.0–100.0)
Platelets: 246 10*3/uL (ref 150–400)
RBC: 4.66 MIL/uL (ref 3.87–5.11)
RDW: 14.8 % (ref 11.5–15.5)
WBC: 13.8 10*3/uL — ABNORMAL HIGH (ref 4.0–10.5)
nRBC: 0 % (ref 0.0–0.2)

## 2023-08-16 LAB — TSH: TSH: 5.252 u[IU]/mL — ABNORMAL HIGH (ref 0.350–4.500)

## 2023-08-16 LAB — KETONES, URINE: Ketones, ur: NEGATIVE mg/dL

## 2023-08-16 LAB — CBG MONITORING, ED
Glucose-Capillary: 449 mg/dL — ABNORMAL HIGH (ref 70–99)
Glucose-Capillary: 371 mg/dL — ABNORMAL HIGH (ref 70–99)

## 2023-08-16 LAB — PREGNANCY, URINE: Preg Test, Ur: NEGATIVE

## 2023-08-16 LAB — MAGNESIUM: Magnesium: 1.6 mg/dL — ABNORMAL LOW (ref 1.7–2.4)

## 2023-08-16 MED ORDER — METOPROLOL TARTRATE 25 MG PO TABS
25.0000 mg | ORAL_TABLET | Freq: Once | ORAL | 0 refills | Status: DC
Start: 2023-08-16 — End: 2023-11-20
  Filled 2023-08-16 (×2): qty 30, 30d supply, fill #0

## 2023-08-16 MED ORDER — METOPROLOL TARTRATE 25 MG PO TABS
25.0000 mg | ORAL_TABLET | Freq: Two times a day (BID) | ORAL | 0 refills | Status: DC
  Filled 2023-08-16: qty 30, 15d supply, fill #0

## 2023-08-16 MED ORDER — SODIUM CHLORIDE 0.9 % IV BOLUS
1000.0000 mL | Freq: Once | INTRAVENOUS | Status: AC
  Administered 2023-08-16: 1000 mL via INTRAVENOUS

## 2023-08-16 MED ORDER — DILTIAZEM HCL 25 MG/5ML IV SOLN
15.0000 mg | Freq: Once | INTRAVENOUS | Status: AC
  Administered 2023-08-16: 15 mg via INTRAVENOUS
  Filled 2023-08-16: qty 5

## 2023-08-16 MED ORDER — DILTIAZEM HCL 25 MG/5ML IV SOLN: 10.0000 mg | Freq: Once | INTRAVENOUS | Status: DC

## 2023-08-16 NOTE — ED Notes (Signed)
 RT Note: VBG completed

## 2023-08-16 NOTE — ED Notes (Signed)
Pt verbalized understanding of d/c instructions, meds, and followup care. Denies questions. VSS, no distress noted. Steady gait to exit with all belongings. Offered wheelchair. Walks to lobby with personal O2 tank and [place].

## 2023-08-16 NOTE — Discharge Instructions (Addendum)
Please take half of a metoprolol daily for 7 days and then progress to 1 tablet daily.  Please follow-up with Dr. Deatra James later this week or early next week.  You may return to the emergency primary for any worsening symptoms.

## 2023-08-16 NOTE — ED Provider Notes (Signed)
[place] EMERGENCY DEPARTMENT AT [facility] Provider Note   CSN: [id number] Arrival date & time: 08/16/23  1139     History Chief Complaint  Patient presents with   Hyperglycemia   Tachycardia    [name] is a 37 y.o. female with history of tetralogy of Fallot with repair and poorly managed diabetes who presents to the emergency department with hyperglycemia and tachycardia.  Patient unaware that she is tachycardic as she is asymptomatic.  Patient was at her pain management appointment and had intake vitals where she was found to be tachycardic in the 140s.  She was sent to the ED for further evaluation.  Patient currently denies any palpitations, shortness of breath, chest pain.  She states that she has always had irregular heart rhythms secondary to the Tetralogy of Fallot.  She denies any recent illness, fever, chills.  She states that from a hyperglycemic perspective that her sugars always run high.  She denies any diarrhea, nausea, vomiting, polyuria polydipsia.   Hyperglycemia      Home Medications Prior to Admission medications   Medication Sig Start Date End Date Taking? Authorizing Provider  albuterol (VENTOLIN HFA) 108 (90 Base) MCG/ACT inhaler INHALE 1-2 PUFFS EVERY 2 (TWO) HOURS AS NEEDED FOR WHEEZING OR SHORTNESS OF BREATH (COUGH). 07/11/22   Myrlene Broker, MD  atorvastatin (LIPITOR) 20 MG tablet TAKE 1 TABLET BY MOUTH EVERY DAY 03/02/23   Myrlene Broker, MD  buPROPion (WELLBUTRIN XL) 150 MG 24 hr tablet TAKE 1 TABLET BY MOUTH EVERY DAY 08/02/22   Myrlene Broker, MD  busPIRone (BUSPAR) 5 MG tablet TAKE 1 TABLET BY MOUTH TWICE A DAY AS NEEDED 05/08/22   Myrlene Broker, MD  empagliflozin (JARDIANCE) 25 MG TABS tablet TAKE 1 TABLET BY MOUTH EVERY DAY BEFORE BREAKFAST 02/07/22   Myrlene Broker, MD  gabapentin (NEURONTIN) 300 MG capsule Take 300 mg by mouth 2 (two) times daily. 01/02/22   [provider]  glipiZIDE  (GLUCOTROL XL) 10 MG 24 hr tablet Take 1 tablet (10 mg total) by mouth daily with breakfast. 05/26/22   Myrlene Broker, MD  HYDROcodone-acetaminophen [facility]) 10-325 MG tablet Take 1 tablet by mouth 5 (five) times daily. 01/31/22   [provider]  LOW-OGESTREL 0.3-30 MG-MCG tablet TAKE 1 TABLET BY MOUTH EVERY DAY 04/17/22   Myrlene Broker, MD  metFORMIN (GLUCOPHAGE) 1000 MG tablet TAKE 1 TABLET BY MOUTH TWICE A DAY WITH A MEAL (NEED OFFICE VISIT) 09/12/22   Myrlene Broker, MD  metoprolol tartrate (LOPRESSOR) 25 MG tablet Take 1 tablet (25 mg total) by mouth once for 1 dose. 08/16/23 08/16/23  Teressa Lower, PA-C  OXYGEN Inhale 3-6 L into the lungs continuous. Uses 3L at rest, up to 6L upon exertion    [provider]  pantoprazole (PROTONIX) 40 MG tablet TAKE 1 TABLET BY MOUTH EVERY DAY 12/04/22   Myrlene Broker, MD  sertraline (ZOLOFT) 100 MG tablet TAKE 2 TABLETS BY MOUTH EVERY DAY 04/04/23   Myrlene Broker, MD  traZODone (DESYREL) 50 MG tablet TAKE 1 TO 2 TABLETS BY MOUTH AT BEDTIME 10/19/22   Myrlene Broker, MD      Allergies    Avelox [moxifloxacin hcl in nacl]    Review of Systems   Review of Systems  All other systems reviewed and are negative.   Physical Exam Updated Vital Signs BP (!) 147/61   Pulse 92   Temp 98.4 F (36.9 C)  Resp 20   SpO2 98%  Physical Exam Vitals and nursing note reviewed.  Constitutional:      General: She is not in acute distress.    Appearance: Normal appearance.  HENT:     Head: Normocephalic and atraumatic.  Eyes:     General:        Right eye: No discharge.        Left eye: No discharge.  Cardiovascular:     Rate and Rhythm: Regular rhythm. Tachycardia present.     Pulses: Normal pulses. No decreased pulses.     Heart sounds: Normal heart sounds.  Pulmonary:     Comments: Clear to auscultation bilaterally.  Normal effort.  No respiratory distress.  No evidence of wheezes, rales, or  rhonchi heard throughout. Abdominal:     General: Abdomen is flat. Bowel sounds are normal. There is no distension.     Tenderness: There is no abdominal tenderness. There is no guarding or rebound.  Musculoskeletal:        General: Normal range of motion.     Cervical back: Neck supple.     Right lower leg: No edema.     Left lower leg: No edema.  Skin:    General: Skin is warm and dry.     Findings: No rash.  Neurological:     General: No focal deficit present.     Mental Status: She is alert.  Psychiatric:        Mood and Affect: Mood normal.        Behavior: Behavior normal.     ED Results / Procedures / Treatments   Labs (all labs ordered are listed, but only abnormal results are displayed) Labs Reviewed  BASIC METABOLIC PANEL - Abnormal; Notable for the following components:      Result Value   Chloride 94 (*)    Glucose, Bld 434 (*)    All other components within normal limits  CBC - Abnormal; Notable for the following components:   WBC 13.8 (*)    All other components within normal limits  URINALYSIS, ROUTINE W REFLEX MICROSCOPIC - Abnormal; Notable for the following components:   Glucose, UA >1,000 (*)    Hgb urine dipstick SMALL (*)    Protein, ur 30 (*)    All other components within normal limits  MAGNESIUM - Abnormal; Notable for the following components:   Magnesium 1.6 (*)    All other components within normal limits  TSH - Abnormal; Notable for the following components:   TSH 5.252 (*)    All other components within normal limits  CBG MONITORING, ED - Abnormal; Notable for the following components:   Glucose-Capillary 449 (*)    All other components within normal limits  I-STAT VENOUS BLOOD GAS, ED - Abnormal; Notable for the following components:   Bicarbonate 33.6 (*)    TCO2 35 (*)    Acid-Base Excess 6.0 (*)    All other components within normal limits  CBG MONITORING, ED - Abnormal; Notable for the following components:   Glucose-Capillary 371  (*)    All other components within normal limits  PREGNANCY, URINE  KETONES, URINE    EKG EKG Interpretation Date/Time:  Thursday August 16 2023 12:36:55 EDT Ventricular Rate:  74 PR Interval:  161 QRS Duration:  84 QT Interval:  372 QTC Calculation: 413 R Axis:   100  Text Interpretation: Sinus rhythm RAE, consider biatrial enlargement Borderline right axis deviation Borderline repolarization abnormality Confirmed by Curatolo,  Adam [phone]) on 08/16/2023 12:38:20 PM  Radiology No results found.  Procedures .Critical Care  Performed by: Teressa Lower, PA-C Authorized by: Teressa Lower, PA-C   Critical care provider statement:    Critical care time (minutes):  35   Critical care was necessary to treat or prevent imminent or life-threatening deterioration of the following conditions:  Cardiac failure   Critical care was time spent personally by me on the following activities:  Development of treatment plan with patient or surrogate, discussions with consultants, evaluation of patient's response to treatment, examination of patient, ordering and review of laboratory studies, ordering and review of radiographic studies, ordering and performing treatments and interventions, pulse oximetry, re-evaluation of patient's condition and review of old charts     Medications Ordered in ED Medications  sodium chloride 0.9 % bolus 1,000 mL (0 mLs Intravenous Stopped 08/16/23 1358)  diltiazem (CARDIZEM) injection 15 mg (15 mg Intravenous Given 08/16/23 1226)    ED Course/ Medical Decision Making/ A&P Clinical Course as of 08/16/23 1410  Thu Aug 16, 2023  1315 I spoke with cardiology who recommended speaking with her pediatric cardiologist Dr. Deatra James for recommendations.  He did mention that there is no reason that the patient should not be able to go home on a beta-blocker but will confirm with pediatric cardiologist. [CF]  1336 I spoke with Dr. Deatra James with [facility] cardiology who is in  agreement of plan of beta-blocker.  We reviewed labs together and we do not feel there is any exogenous cause of her tachycardia at this time.  Will likely place in a beta-blocker and have her follow-up in the outpatient setting.  He will make sure that she is expedited. [CF]  1337 CBC(!) There is evidence of leukocytosis otherwise normal. [CF]  1337 I-Stat venous blood gas, ([facility] ED, [facility], [facility])(!) pH is normal.  Bicarb is slightly elevated likely from a compensated acidosis.  No evidence of DKA as there is no anion gap and no ketones. [CF]  1338 TSH(!) Elevated.  This needs to be followed up in the outpatient setting. [CF]  1338 Magnesium(!) Slightly below normal. [CF]  1338 Pregnancy, urine Negative. [CF]  1339 Ketones, urine Negative. [CF]  1339 Urinalysis, Routine w reflex microscopic -Urine, Clean Catch(!) There is evidence of glucosuria. [CF]  1339 Basic metabolic panel(!) Hypochloremia. [CF]  1401 On reevaluation, patient states she is feeling much better.  Heart rate has normalized and she is sitting around 90 bpm and normal sinus rhythm.  Will place her on a low-dose of metoprolol and have her follow-up in the outpatient setting. All questions and concerns addressed.  Strict turn precautions were discussed.  Patient is in agreement of plan. [CF]    Clinical Course User Index [CF] Teressa Lower, PA-C   {   Click here for ABCD2, HEART and other calculators  Medical Decision Making CAMBREA ORNER is a 37 y.o. female patient presents to the emergency department today for further evaluation of tachycardia and hyperglycemia.  Patient arrives with a normal rhythm but tachycardic in the 150s.  Patient is completely asymptomatic.  Does not appear to be SVT.  Will look for external causes but this could be related to Tetralogy of Fallot.  Will start off by getting her 15 mg IV push of diltiazem and recapture an EKG to further assess rhythm.  Will also plan to give her fluids and get  labs to include magnesium, TSH, and other basic labs.  Patient will follow-up with  cardiology.  I will titrate up her metoprolol to 25 mg after she takes 7 days worth of 12.5 as the patient does take bupropion and there is some drug interactions.  All questions or concerns addressed.  She is safe for discharge at this time.  Amount and/or Complexity of Data Reviewed Labs: ordered. Decision-making details documented in ED Course.  Risk Prescription drug management.    Final Clinical Impression(s) / ED Diagnoses Final diagnoses:  Tachycardia  Hyperglycemia    Rx / DC Orders ED Discharge Orders          Ordered    metoprolol tartrate (LOPRESSOR) 25 MG tablet  2 times daily,   Status:  Discontinued        08/16/23 1407    metoprolol tartrate (LOPRESSOR) 25 MG tablet   Once        08/16/23 1410              Honor Loh Faith, [place] 08/16/23 1410    Virgina Norfolk, DO 08/16/23 1433

## 2023-08-16 NOTE — ED Triage Notes (Signed)
Pt c/o hyperglycemia (497) & tachycardia (150). Pt reports she was at pain mgmt clinic for monthly check up when tachycardia was noted. Denies associated CP/ new-onset SHOB, was sent to ED from clinic for follow up/ further workup.

## 2023-08-28 ENCOUNTER — Telehealth: Payer: Self-pay

## 2023-08-28 NOTE — Telephone Encounter (Signed)
Transition Care Management Follow-up Telephone Call Date of discharge and from where: [facility] 8/22 How have you been since you were released from the hospital? Dong fine and will be following up with PCP, an appointment has been made Any questions or concerns? No  Items Reviewed: Did the pt receive and understand the discharge instructions provided? Yes  Medications obtained and verified? No  Other? No  Any new allergies since your discharge? No  Dietary orders reviewed? No Do you have support at home? Yes     Follow up appointments reviewed:  PCP Hospital f/u appt confirmed? Yes Scheduled to see PCP on  @ . Specialist Hospital f/u appt confirmed? No  Scheduled to see  on  @ . Are transportation arrangements needed? No If their condition worsens, is the pt aware to call PCP or go to the Emergency Dept.? Yes Was the patient provided with contact information for the PCP's office or ED? Yes Was to pt encouraged to call back with questions or concerns? Yes

## 2023-09-06 ENCOUNTER — Ambulatory Visit (INDEPENDENT_AMBULATORY_CARE_PROVIDER_SITE_OTHER): Payer: Medicare HMO

## 2023-09-06 VITALS — Ht 59.5 in | Wt 178.0 lb

## 2023-09-06 DIAGNOSIS — Z0000 Encounter for general adult medical examination without abnormal findings: Secondary | ICD-10-CM

## 2023-09-06 NOTE — Progress Notes (Signed)
Subjective:   [name] is a 37 y.o. female who presents for Medicare Annual (Subsequent) preventive examination.  Visit Complete: Virtual  I connected with  [name] on 09/06/23 by a audio enabled telemedicine application and verified that I am speaking with the correct person using two identifiers.  Patient Location: Home  Provider Location: Office/Clinic  I discussed the limitations of evaluation and management by telemedicine. The patient expressed understanding and agreed to proceed.  Vital Signs: Unable to obtain new vitals due to this being a telehealth visit.   Review of Systems   Cardiac Risk Factors include: diabetes mellitus;dyslipidemia     Objective:    Today's Vitals   09/06/23 1107  Weight: 178 lb (80.7 kg)  Height: 4' 11.5" (1.511 m)   Body mass index is 35.35 kg/m.     09/06/2023   11:25 AM 09/26/2019    2:48 PM 01/06/2013    8:45 PM 11/02/2011    9:00 AM 11/01/2011    2:54 AM  Advanced Directives  Does Patient Have a Medical Advance Directive? Yes No Patient does not have advance directive Patient does not have advance directive;Patient would not like information Patient does not have advance directive;Patient would not like information  Type of Public librarian Power of [place];Living will      Copy of Healthcare Power of Attorney in Chart? No - copy requested      Would patient like information on creating a medical advance directive?  Yes (ED - send information to MyChart)     Pre-existing out of facility DNR order (yellow form or pink MOST form)   No      Current Medications (verified) Outpatient Encounter Medications as of 09/06/2023  Medication Sig   albuterol (VENTOLIN HFA) 108 (90 Base) MCG/ACT inhaler INHALE 1-2 PUFFS EVERY 2 (TWO) HOURS AS NEEDED FOR WHEEZING OR SHORTNESS OF BREATH (COUGH).   atorvastatin (LIPITOR) 20 MG tablet TAKE 1 TABLET BY MOUTH EVERY DAY   buPROPion (WELLBUTRIN XL) 150 MG 24 hr tablet  TAKE 1 TABLET BY MOUTH EVERY DAY   busPIRone (BUSPAR) 5 MG tablet TAKE 1 TABLET BY MOUTH TWICE A DAY AS NEEDED   empagliflozin (JARDIANCE) 25 MG TABS tablet TAKE 1 TABLET BY MOUTH EVERY DAY BEFORE BREAKFAST   gabapentin (NEURONTIN) 300 MG capsule Take 300 mg by mouth 2 (two) times daily.   glipiZIDE (GLUCOTROL XL) 10 MG 24 hr tablet Take 1 tablet (10 mg total) by mouth daily with breakfast.   HYDROcodone-acetaminophen (NORCO) 10-325 MG tablet Take 1 tablet by mouth 5 (five) times daily.   LOW-OGESTREL 0.3-30 MG-MCG tablet TAKE 1 TABLET BY MOUTH EVERY DAY   metFORMIN (GLUCOPHAGE) 1000 MG tablet TAKE 1 TABLET BY MOUTH TWICE A DAY WITH A MEAL (NEED OFFICE VISIT)   metoprolol tartrate (LOPRESSOR) 25 MG tablet Take 1 tablet (25 mg total) by mouth once for 1 dose.   OXYGEN Inhale 3-6 L into the lungs continuous. Uses 3L at rest, up to 6L upon exertion   pantoprazole (PROTONIX) 40 MG tablet TAKE 1 TABLET BY MOUTH EVERY DAY   sertraline (ZOLOFT) 100 MG tablet TAKE 2 TABLETS BY MOUTH EVERY DAY   traZODone (DESYREL) 50 MG tablet TAKE 1 TO 2 TABLETS BY MOUTH AT BEDTIME   No facility-administered encounter medications on file as of 09/06/2023.    Allergies (verified) Avelox [moxifloxacin hcl in nacl]   History: Past Medical History:  Diagnosis Date   Anxiety    Asthma  As a child   Back pain    Diabetes mellitus (HCC)    GERD (gastroesophageal reflux disease)    Heart failure (HCC)    Hyperlipidemia    Pulmonary hypertension (HCC)    Scoliosis deformity of spine    Tetralogy of Fallot    Past Surgical History:  Procedure Laterality Date   BACK SURGERY     Rods for scoliosis   BLALOCK PROCEDURE     3 months   TETRALOGY OF FALLOT REPAIR     Age 7 at [facility]   Family History  Problem Relation Age of Onset   Diabetes Mother    Hypertension Mother    Cancer Mother        uterine cancer   Alcohol abuse Father    Cancer Maternal Aunt        Breast Cancer, Lung cancer   Social History    Socioeconomic History   Marital status: Single    Spouse name: Not on file   Number of children: 0   Years of education: Not on file   Highest education level: Not on file  Occupational History   Occupation: unemployed  Tobacco Use   Smoking status: Former    Current packs/day: 0.00    Average packs/day: 1.5 packs/day for 10.0 years (15.0 ttl pk-yrs)    Types: Cigarettes    Start date: 02/02/2001    Quit date: 02/02/2011    Years since quitting: 12.6   Smokeless tobacco: Never  Vaping Use   Vaping status: Never Used  Substance and Sexual Activity   Alcohol use: No    Alcohol/week: 0.0 standard drinks of alcohol   Drug use: No   Sexual activity: Not on file  Other Topics Concern   Not on file  Social History Narrative   Unemployed at present.  Worked as Conservation officer, nature at [employer].   HSG, [employer]- a little bit   Single, in a 4 year relationship   No children    Unemployed   No history of abuse   Social Determinants of Corporate investment banker Strain: Not on file  Food Insecurity: Not on file  Transportation Needs: Not on file  Physical Activity: Not on file  Stress: Not on file  Social Connections: Not on file    Tobacco Counseling Counseling given: Not Answered   Clinical Intake:  Pre-visit preparation completed: Yes  Pain : No/denies pain     BMI - recorded: 35.35 Nutritional Risks: None Diabetes: Yes CBG done?: No Did pt. bring in CBG monitor from home?: No  How often do you need to have someone help you when you read instructions, pamphlets, or other written materials from your doctor or pharmacy?: 1 - Never  Interpreter Needed?: No  Information entered by :: Selig Wampole, RMA   Activities of Daily Living    09/06/2023   11:07 AM  In your present state of health, do you have any difficulty performing the following activities:  Hearing? 0  Vision? 0  Difficulty concentrating or making decisions? 0  Walking or climbing stairs? 1   Comment out of breath  Dressing or bathing? 0  Doing errands, shopping? 1  Comment someone drives her around  Preparing Food and eating ? N  Using the Toilet? N  In the past six months, have you accidently leaked urine? N  Do you have problems with loss of bowel control? N  Managing your Medications? N  Managing your Finances? N  Housekeeping or  managing your Housekeeping? N    Patient Care Team: Myrlene Broker, MD as PCP - General (Internal Medicine)  Indicate any recent Medical Services you may have received from other than [facility] providers in the past year (date may be approximate).     Assessment:   This is a routine wellness examination for [place].  Hearing/Vision screen Hearing Screening - Comments:: Denies hearing difficulties   Vision Screening - Comments:: Denies vision issues   Goals Addressed   None   Depression Screen    05/08/2022   10:26 AM 02/23/2021   10:47 AM 01/30/2020    9:22 AM 02/27/2018    2:38 PM 11/28/2017   10:48 AM  PHQ 2/9 Scores  PHQ - 2 Score 0 0 0 2 0  PHQ- 9 Score 0   4     Fall Risk    09/06/2023   11:26 AM 05/08/2022   10:26 AM  Fall Risk   Falls in the past year? 0 0  Number falls in past yr: 0 0  Injury with Fall? 0 0  Risk for fall due to : No Fall Risks   Follow up Falls prevention discussed;Falls evaluation completed     MEDICARE RISK AT HOME: Medicare Risk at Home Any stairs in or around the home?: No Home free of loose throw rugs in walkways, pet beds, electrical cords, etc?: Yes Adequate lighting in your home to reduce risk of falls?: Yes Life alert?: No Use of a cane, walker or w/c?: No Grab bars in the bathroom?: No Shower chair or bench in shower?: Yes Elevated toilet seat or a handicapped toilet?: No  TIMED UP AND GO:  Was the test performed?  No    Cognitive Function:        09/06/2023   11:27 AM  6CIT Screen  What Year? 0 points  What month? 0 points  What time? 0 points  Count back from 20 0  points    Immunizations Immunization History  Administered Date(s) Administered   Influenza Whole 12/31/2012   Influenza,inj,Quad PF,6+ Mos 11/05/2013, 10/02/2014, 12/22/2015, 09/29/2016, 08/30/2017   Influenza-Unspecified 10/11/2018   Pneumococcal Conjugate-13 12/31/2012   Pneumococcal Polysaccharide-23 02/16/2014   Tdap 08/22/2012    TDAP status: Due, Education has been provided regarding the importance of this vaccine. Advised may receive this vaccine at local pharmacy or Health Dept. Aware to provide a copy of the vaccination record if obtained from local pharmacy or Health Dept. Verbalized acceptance and understanding.  Flu Vaccine status: Due, Education has been provided regarding the importance of this vaccine. Advised may receive this vaccine at local pharmacy or Health Dept. Aware to provide a copy of the vaccination record if obtained from local pharmacy or Health Dept. Verbalized acceptance and understanding.   Covid-19 vaccine status: Declined, Education has been provided regarding the importance of this vaccine but patient still declined. Advised may receive this vaccine at local pharmacy or Health Dept.or vaccine clinic. Aware to provide a copy of the vaccination record if obtained from local pharmacy or Health Dept. Verbalized acceptance and understanding.  Qualifies for Shingles Vaccine? No   Zostavax completed No   Shingrix Completed?: No.    Education has been provided regarding the importance of this vaccine. Patient has been advised to call insurance company to determine out of pocket expense if they have not yet received this vaccine. Advised may also receive vaccine at local pharmacy or Health Dept. Verbalized acceptance and understanding.  Screening Tests Health Maintenance  Topic Date Due   COVID-19 Vaccine (1) Never done   OPHTHALMOLOGY EXAM  Never done   Hepatitis C Screening  Never done   PAP SMEAR-Modifier  08/23/2013   DTaP/Tdap/Td (2 - Td or Tdap)  08/22/2022   HEMOGLOBIN A1C  11/08/2022   Diabetic kidney evaluation - Urine ACR  02/07/2023   FOOT EXAM  02/07/2023   INFLUENZA VACCINE  07/26/2023   Diabetic kidney evaluation - eGFR measurement  08/15/2024   Medicare Annual Wellness (AWV)  09/05/2024   HIV Screening  Completed   HPV VACCINES  Aged Out    Health Maintenance  Health Maintenance Due  Topic Date Due   COVID-19 Vaccine (1) Never done   OPHTHALMOLOGY EXAM  Never done   Hepatitis C Screening  Never done   PAP SMEAR-Modifier  08/23/2013   DTaP/Tdap/Td (2 - Td or Tdap) 08/22/2022   HEMOGLOBIN A1C  11/08/2022   Diabetic kidney evaluation - Urine ACR  02/07/2023   FOOT EXAM  02/07/2023   INFLUENZA VACCINE  07/26/2023    Lung Cancer Screening: (Low Dose CT Chest recommended if Age 44-80 years, 20 pack-year currently smoking OR have quit w/in 15years.) does not qualify.   Lung Cancer Screening Referral: N/A  Additional Screening:  Hepatitis C Screening: does not qualify;   Vision Screening: Recommended annual ophthalmology exams for early detection of glaucoma and other disorders of the eye. Is the patient up to date with their annual eye exam?  No  Who is the provider or what is the name of the office in which the patient attends annual eye exams? N/A If pt is not established with a provider, would they like to be referred to a provider to establish care? Yes .   Dental Screening: Recommended annual dental exams for proper oral hygiene  Diabetic Foot Exam: Diabetic Foot Exam: Overdue, Pt has been advised about the importance in completing this exam. Pt is scheduled for diabetic foot exam on N/A.  Community Resource Referral / Chronic Care Management: CRR required this visit?  No   CCM required this visit?  No     Plan:     I have personally reviewed and noted the following in the patient's chart:   Medical and social history Use of alcohol, tobacco or illicit drugs  Current medications and supplements  including opioid prescriptions. Patient is currently taking opioid prescriptions. Information provided to patient regarding non-opioid alternatives. Patient advised to discuss non-opioid treatment plan with their provider. Functional ability and status Nutritional status Physical activity Advanced directives List of other physicians Hospitalizations, surgeries, and ER visits in previous 12 months Vitals Screenings to include cognitive, depression, and falls Referrals and appointments  In addition, I have reviewed and discussed with patient certain preventive protocols, quality metrics, and best practice recommendations. A written personalized care plan for preventive services as well as general preventive health recommendations were provided to patient.     Blaike Vickers L Ankush Gintz, CMA   09/06/2023   After Visit Summary: (MyChart) Due to this being a telephonic visit, the after visit summary with patients personalized plan was offered to patient via MyChart   Nurse Notes: Patient is due for Tdap and Flu vaccines.  She is due for a A1C reading and a Kidney evaluation.  Patient stated that she is in need of some refills, however patient has not been for a follow up in over 1 year.  I informed patient to schedule a visit to be seen for a follow up with  her PCP.  Patient stated that she would call to schedule a follow up visit.  She is also due for a eye exam.  Patient has no other concerns to address today.

## 2023-09-06 NOTE — Patient Instructions (Signed)
[name] , Thank you for taking time to come for your Medicare Wellness Visit. I appreciate your ongoing commitment to your health goals. Please review the following plan we discussed and let me know if I can assist you in the future.   Referrals/Orders/Follow-Ups/Clinician Recommendations: You are due for a Tetanus vaccine which you can get at your pharmacy.  You are also due for a follow up office visit.  There are labs that need to be performed on you.   Remember that you have to keep up with your appointments to get the care you need and medication refills.   It was nice speaking with you today.  This is a list of the screening recommended for you and due dates:  Health Maintenance  Topic Date Due   COVID-19 Vaccine (1) Never done   Eye exam for diabetics  Never done   Hepatitis C Screening  Never done   Pap Smear  08/23/2013   DTaP/Tdap/Td vaccine (2 - Td or Tdap) 08/22/2022   Hemoglobin A1C  11/08/2022   Yearly kidney health urinalysis for diabetes  02/07/2023   Complete foot exam   02/07/2023   Flu Shot  07/26/2023   Yearly kidney function blood test for diabetes  08/15/2024   Medicare Annual Wellness Visit  09/05/2024   HIV Screening  Completed   HPV Vaccine  Aged Out    Advanced directives: (Copy Requested) Please bring a copy of your health care power of attorney and living will to the office to be added to your chart at your convenience.  Next Medicare Annual Wellness Visit scheduled for next year: No  Managing Pain Without Opioids Opioids are strong medicines used to treat moderate to severe pain. For some people, especially those who have long-term (chronic) pain, opioids may not be the best choice for pain management due to: Side effects like nausea, constipation, and sleepiness. The risk of addiction (opioid use disorder). The longer you take opioids, the greater your risk of addiction. Pain that lasts for more than 3 months is called chronic pain. Managing chronic  pain usually requires more than one approach and is often provided by a team of health care providers working together (multidisciplinary approach). Pain management may be done at a pain management center or pain clinic. How to manage pain without the use of opioids Use non-opioid medicines Non-opioid medicines for pain may include: Over-the-counter or prescription non-steroidal anti-inflammatory drugs (NSAIDs). These may be the first medicines used for pain. They work well for muscle and bone pain, and they reduce swelling. Acetaminophen. This over-the-counter medicine may work well for milder pain but not swelling. Antidepressants. These may be used to treat chronic pain. A certain type of antidepressant (tricyclics) is often used. These medicines are given in lower doses for pain than when used for depression. Anticonvulsants. These are usually used to treat seizures but may also reduce nerve (neuropathic) pain. Muscle relaxants. These relieve pain caused by sudden muscle tightening (spasms). You may also use a pain medicine that is applied to the skin as a patch, cream, or gel (topical analgesic), such as a numbing medicine. These may cause fewer side effects than medicines taken by mouth. Do certain therapies as directed Some therapies can help with pain management. They include: Physical therapy. You will do exercises to gain strength and flexibility. A physical therapist may teach you exercises to move and stretch parts of your body that are weak, stiff, or painful. You can learn these exercises at physical  therapy visits and practice them at home. Physical therapy may also involve: Massage. Heat wraps or applying heat or cold to affected areas. Electrical signals that interrupt pain signals (transcutaneous electrical nerve stimulation, TENS). Weak lasers that reduce pain and swelling (low-level laser therapy). Signals from your body that help you learn to regulate pain  (biofeedback). Occupational therapy. This helps you to learn ways to function at home and work with less pain. Recreational therapy. This involves trying new activities or hobbies, such as a physical activity or drawing. Mental health therapy, including: Cognitive behavioral therapy (CBT). This helps you learn coping skills for dealing with pain. Acceptance and commitment therapy (ACT) to change the way you think and react to pain. Relaxation therapies, including muscle relaxation exercises and mindfulness-based stress reduction. Pain management counseling. This may be individual, family, or group counseling.  Receive medical treatments Medical treatments for pain management include: Nerve block injections. These may include a pain blocker and anti-inflammatory medicines. You may have injections: Near the spine to relieve chronic back or neck pain. Into joints to relieve back or joint pain. Into nerve areas that supply a painful area to relieve body pain. Into muscles (trigger point injections) to relieve some painful muscle conditions. A medical device placed near your spine to help block pain signals and relieve nerve pain or chronic back pain (spinal cord stimulation device). Acupuncture. Follow these instructions at home Medicines Take over-the-counter and prescription medicines only as told by your health care provider. If you are taking pain medicine, ask your health care providers about possible side effects to watch out for. Do not drive or use heavy machinery while taking prescription opioid pain medicine. Lifestyle  Do not use drugs or alcohol to reduce pain. If you drink alcohol, limit how much you have to: 0-1 drink a day for women who are not pregnant. 0-2 drinks a day for men. Know how much alcohol is in a drink. In the [place] one drink equals one 12 oz bottle of beer (355 mL), one 5 oz glass of wine (148 mL), or one 1 oz glass of hard liquor (44 mL). Do not use any  products that contain nicotine or tobacco. These products include cigarettes, chewing tobacco, and vaping devices, such as e-cigarettes. If you need help quitting, ask your health care provider. Eat a healthy diet and maintain a healthy weight. Poor diet and excess weight may make pain worse. Eat foods that are high in fiber. These include fresh fruits and vegetables, whole grains, and beans. Limit foods that are high in fat and processed sugars, such as fried and sweet foods. Exercise regularly. Exercise lowers stress and may help relieve pain. Ask your health care provider what activities and exercises are safe for you. If your health care provider approves, join an exercise class that combines movement and stress reduction. Examples include yoga and tai chi. Get enough sleep. Lack of sleep may make pain worse. Lower stress as much as possible. Practice stress reduction techniques as told by your therapist. General instructions Work with all your pain management providers to find the treatments that work best for you. You are an important member of your pain management team. There are many things you can do to reduce pain on your own. Consider joining an online or in-person support group for people who have chronic pain. Keep all follow-up visits. This is important. Where to find more information You can find more information about managing pain without opioids from: American Academy of Pain  Medicine: painmed.org Institute for Chronic Pain: instituteforchronicpain.org American Chronic Pain Association: theacpa.org Contact a health care provider if: You have side effects from pain medicine. Your pain gets worse or does not get better with treatments or home therapy. You are struggling with anxiety or depression. Summary Many types of pain can be managed without opioids. Chronic pain may respond better to pain management without opioids. Pain is best managed when you and a team of health care  providers work together. Pain management without opioids may include non-opioid medicines, medical treatments, physical therapy, mental health therapy, and lifestyle changes. Tell your health care providers if your pain gets worse or is not being managed well enough. This information is not intended to replace advice given to you by your health care provider. Make sure you discuss any questions you have with your health care provider. Document Revised: 03/23/2021 Document Reviewed: 03/23/2021 Elsevier Patient Education  2024 [employer].

## 2023-09-10 DIAGNOSIS — J188 Other pneumonia, unspecified organism: Secondary | ICD-10-CM | POA: Diagnosis not present

## 2023-09-10 DIAGNOSIS — R011 Cardiac murmur, unspecified: Secondary | ICD-10-CM | POA: Diagnosis not present

## 2023-09-13 DIAGNOSIS — G894 Chronic pain syndrome: Secondary | ICD-10-CM | POA: Diagnosis not present

## 2023-09-13 DIAGNOSIS — E119 Type 2 diabetes mellitus without complications: Secondary | ICD-10-CM | POA: Diagnosis not present

## 2023-09-13 DIAGNOSIS — M545 Low back pain, unspecified: Secondary | ICD-10-CM | POA: Diagnosis not present

## 2023-09-13 DIAGNOSIS — Z79899 Other long term (current) drug therapy: Secondary | ICD-10-CM | POA: Diagnosis not present

## 2023-09-13 DIAGNOSIS — M25551 Pain in right hip: Secondary | ICD-10-CM | POA: Diagnosis not present

## 2023-09-13 DIAGNOSIS — R000 Tachycardia, unspecified: Secondary | ICD-10-CM | POA: Diagnosis not present

## 2023-09-13 DIAGNOSIS — R030 Elevated blood-pressure reading, without diagnosis of hypertension: Secondary | ICD-10-CM | POA: Diagnosis not present

## 2023-09-13 DIAGNOSIS — Z6836 Body mass index (BMI) 36.0-36.9, adult: Secondary | ICD-10-CM | POA: Diagnosis not present

## 2023-09-13 DIAGNOSIS — E6609 Other obesity due to excess calories: Secondary | ICD-10-CM | POA: Diagnosis not present

## 2023-09-13 DIAGNOSIS — F1120 Opioid dependence, uncomplicated: Secondary | ICD-10-CM | POA: Diagnosis not present

## 2023-09-13 DIAGNOSIS — M419 Scoliosis, unspecified: Secondary | ICD-10-CM | POA: Diagnosis not present

## 2023-10-10 DIAGNOSIS — J188 Other pneumonia, unspecified organism: Secondary | ICD-10-CM | POA: Diagnosis not present

## 2023-10-10 DIAGNOSIS — R011 Cardiac murmur, unspecified: Secondary | ICD-10-CM | POA: Diagnosis not present

## 2023-10-12 DIAGNOSIS — Z79891 Long term (current) use of opiate analgesic: Secondary | ICD-10-CM | POA: Diagnosis not present

## 2023-10-12 DIAGNOSIS — Z79899 Other long term (current) drug therapy: Secondary | ICD-10-CM | POA: Diagnosis not present

## 2023-10-12 DIAGNOSIS — F1120 Opioid dependence, uncomplicated: Secondary | ICD-10-CM | POA: Diagnosis not present

## 2023-10-12 DIAGNOSIS — M419 Scoliosis, unspecified: Secondary | ICD-10-CM | POA: Diagnosis not present

## 2023-10-12 DIAGNOSIS — G894 Chronic pain syndrome: Secondary | ICD-10-CM | POA: Diagnosis not present

## 2023-10-12 DIAGNOSIS — Z6836 Body mass index (BMI) 36.0-36.9, adult: Secondary | ICD-10-CM | POA: Diagnosis not present

## 2023-10-12 DIAGNOSIS — M129 Arthropathy, unspecified: Secondary | ICD-10-CM | POA: Diagnosis not present

## 2023-10-12 DIAGNOSIS — E6609 Other obesity due to excess calories: Secondary | ICD-10-CM | POA: Diagnosis not present

## 2023-10-12 DIAGNOSIS — E119 Type 2 diabetes mellitus without complications: Secondary | ICD-10-CM | POA: Diagnosis not present

## 2023-10-12 DIAGNOSIS — M545 Low back pain, unspecified: Secondary | ICD-10-CM | POA: Diagnosis not present

## 2023-10-12 DIAGNOSIS — Z3200 Encounter for pregnancy test, result unknown: Secondary | ICD-10-CM | POA: Diagnosis not present

## 2023-10-12 DIAGNOSIS — Z6835 Body mass index (BMI) 35.0-35.9, adult: Secondary | ICD-10-CM | POA: Diagnosis not present

## 2023-10-18 DIAGNOSIS — Z79899 Other long term (current) drug therapy: Secondary | ICD-10-CM | POA: Diagnosis not present

## 2023-11-10 DIAGNOSIS — J188 Other pneumonia, unspecified organism: Secondary | ICD-10-CM | POA: Diagnosis not present

## 2023-11-10 DIAGNOSIS — R011 Cardiac murmur, unspecified: Secondary | ICD-10-CM | POA: Diagnosis not present

## 2023-11-13 DIAGNOSIS — N390 Urinary tract infection, site not specified: Secondary | ICD-10-CM | POA: Diagnosis not present

## 2023-11-13 DIAGNOSIS — Z79899 Other long term (current) drug therapy: Secondary | ICD-10-CM | POA: Diagnosis not present

## 2023-11-13 DIAGNOSIS — E119 Type 2 diabetes mellitus without complications: Secondary | ICD-10-CM | POA: Diagnosis not present

## 2023-11-16 DIAGNOSIS — Z79899 Other long term (current) drug therapy: Secondary | ICD-10-CM | POA: Diagnosis not present

## 2023-11-20 ENCOUNTER — Encounter: Payer: Self-pay | Admitting: Internal Medicine

## 2023-11-20 ENCOUNTER — Ambulatory Visit (INDEPENDENT_AMBULATORY_CARE_PROVIDER_SITE_OTHER): Payer: Medicare HMO | Admitting: Internal Medicine

## 2023-11-20 VITALS — BP 160/80 | HR 135 | Temp 97.9°F | Ht 59.5 in | Wt 179.0 lb

## 2023-11-20 DIAGNOSIS — Z3041 Encounter for surveillance of contraceptive pills: Secondary | ICD-10-CM | POA: Diagnosis not present

## 2023-11-20 DIAGNOSIS — I10 Essential (primary) hypertension: Secondary | ICD-10-CM | POA: Diagnosis not present

## 2023-11-20 DIAGNOSIS — K219 Gastro-esophageal reflux disease without esophagitis: Secondary | ICD-10-CM

## 2023-11-20 DIAGNOSIS — I2729 Other secondary pulmonary hypertension: Secondary | ICD-10-CM | POA: Diagnosis not present

## 2023-11-20 DIAGNOSIS — I50810 Right heart failure, unspecified: Secondary | ICD-10-CM

## 2023-11-20 DIAGNOSIS — E118 Type 2 diabetes mellitus with unspecified complications: Secondary | ICD-10-CM | POA: Diagnosis not present

## 2023-11-20 DIAGNOSIS — R7989 Other specified abnormal findings of blood chemistry: Secondary | ICD-10-CM | POA: Diagnosis not present

## 2023-11-20 DIAGNOSIS — F1120 Opioid dependence, uncomplicated: Secondary | ICD-10-CM | POA: Diagnosis not present

## 2023-11-20 DIAGNOSIS — Z8774 Personal history of (corrected) congenital malformations of heart and circulatory system: Secondary | ICD-10-CM

## 2023-11-20 DIAGNOSIS — E1169 Type 2 diabetes mellitus with other specified complication: Secondary | ICD-10-CM | POA: Diagnosis not present

## 2023-11-20 DIAGNOSIS — F419 Anxiety disorder, unspecified: Secondary | ICD-10-CM

## 2023-11-20 DIAGNOSIS — E785 Hyperlipidemia, unspecified: Secondary | ICD-10-CM | POA: Diagnosis not present

## 2023-11-20 LAB — COMPREHENSIVE METABOLIC PANEL
ALT: 25 U/L (ref 0–35)
AST: 15 U/L (ref 0–37)
Albumin: 4.2 g/dL (ref 3.5–5.2)
Alkaline Phosphatase: 93 U/L (ref 39–117)
BUN: 21 mg/dL (ref 6–23)
CO2: 31 meq/L (ref 19–32)
Calcium: 9.4 mg/dL (ref 8.4–10.5)
Chloride: 93 meq/L — ABNORMAL LOW (ref 96–112)
Creatinine, Ser: 0.87 mg/dL (ref 0.40–1.20)
GFR: 84.1 mL/min (ref >60.00)
Glucose, Bld: 479 mg/dL — ABNORMAL HIGH (ref 70–99)
Potassium: 4.2 meq/L (ref 3.5–5.1)
Sodium: 134 meq/L — ABNORMAL LOW (ref 135–145)
Total Bilirubin: 0.4 mg/dL (ref 0.2–1.2)
Total Protein: 7 g/dL (ref 6.0–8.3)

## 2023-11-20 LAB — LIPID PANEL
Cholesterol: 158 mg/dL (ref 0–200)
HDL: 32.9 mg/dL — ABNORMAL LOW (ref >39.00)
Total CHOL/HDL Ratio: 5
Triglycerides: 473 mg/dL — ABNORMAL HIGH (ref 0.0–149.0)

## 2023-11-20 LAB — CBC
HCT: 39.5 % (ref 36.0–46.0)
Hemoglobin: 12.7 g/dL (ref 12.0–15.0)
MCHC: 32.2 g/dL (ref 30.0–36.0)
MCV: 88.8 fL (ref 78.0–100.0)
Platelets: 257 10*3/uL (ref 150.0–400.0)
RBC: 4.45 Mil/uL (ref 3.87–5.11)
RDW: 14.3 % (ref 11.5–15.5)
WBC: 10.8 10*3/uL — ABNORMAL HIGH (ref 4.0–10.5)

## 2023-11-20 LAB — LDL CHOLESTEROL, DIRECT: Direct LDL: 75 mg/dL

## 2023-11-20 LAB — URINALYSIS, ROUTINE W REFLEX MICROSCOPIC
Bilirubin Urine: NEGATIVE
Hgb urine dipstick: NEGATIVE
Ketones, ur: NEGATIVE
Leukocytes,Ua: NEGATIVE
Nitrite: NEGATIVE
Specific Gravity, Urine: 1.015 (ref 1.000–1.030)
Total Protein, Urine: NEGATIVE
Urine Glucose: >=1000 — AB
Urobilinogen, UA: 0.2 (ref 0.0–1.0)
pH: 6 (ref 5.0–8.0)

## 2023-11-20 LAB — VITAMIN D 25 HYDROXY (VIT D DEFICIENCY, FRACTURES): VITD: 10.79 ng/mL — ABNORMAL LOW (ref 30.00–100.00)

## 2023-11-20 LAB — MICROALBUMIN / CREATININE URINE RATIO
Creatinine,U: 51.6 mg/dL
Microalb Creat Ratio: 10.6 mg/g (ref 0.0–30.0)
Microalb, Ur: 5.5 mg/dL — ABNORMAL HIGH (ref 0.0–1.9)

## 2023-11-20 LAB — HEMOGLOBIN A1C: Hgb A1c MFr Bld: 12.5 % — ABNORMAL HIGH (ref 4.6–6.5)

## 2023-11-20 MED ORDER — ATORVASTATIN CALCIUM 20 MG PO TABS: 20.0000 mg | ORAL_TABLET | Freq: Every day | ORAL | 3 refills | Status: AC

## 2023-11-20 MED ORDER — SEMAGLUTIDE (2 MG/DOSE) 8 MG/3ML ~~LOC~~ SOPN: 2.0000 mg | PEN_INJECTOR | SUBCUTANEOUS | 6 refills | Status: DC

## 2023-11-20 MED ORDER — PANTOPRAZOLE SODIUM 40 MG PO TBEC: 40.0000 mg | DELAYED_RELEASE_TABLET | Freq: Every day | ORAL | 0 refills | Status: DC

## 2023-11-20 MED ORDER — OZEMPIC (0.25 OR 0.5 MG/DOSE) 2 MG/3ML ~~LOC~~ SOPN: 0.2500 mg | PEN_INJECTOR | SUBCUTANEOUS | 0 refills | Status: AC

## 2023-11-20 MED ORDER — METFORMIN HCL 1000 MG PO TABS: 1000.0000 mg | ORAL_TABLET | Freq: Two times a day (BID) | ORAL | 3 refills | Status: DC

## 2023-11-20 MED ORDER — SERTRALINE HCL 100 MG PO TABS: ORAL_TABLET | ORAL | 3 refills | Status: DC

## 2023-11-20 MED ORDER — BUPROPION HCL ER (XL) 150 MG PO TB24: 150.0000 mg | ORAL_TABLET | Freq: Every day | ORAL | 3 refills | Status: AC

## 2023-11-20 MED ORDER — SEMAGLUTIDE (1 MG/DOSE) 4 MG/3ML ~~LOC~~ SOPN: 1.0000 mg | PEN_INJECTOR | SUBCUTANEOUS | 0 refills | Status: AC

## 2023-11-20 MED ORDER — LOW-OGESTREL 0.3-30 MG-MCG PO TABS: 1.0000 | ORAL_TABLET | Freq: Every day | ORAL | 11 refills | Status: DC

## 2023-11-20 MED ORDER — METOPROLOL TARTRATE 25 MG PO TABS: 25.0000 mg | ORAL_TABLET | Freq: Every day | ORAL | 1 refills | Status: DC

## 2023-11-20 MED ORDER — SEMAGLUTIDE(0.25 OR 0.5MG/DOS) 2 MG/3ML ~~LOC~~ SOPN: 0.5000 mg | PEN_INJECTOR | SUBCUTANEOUS | 0 refills | Status: AC

## 2023-11-20 MED ORDER — EMPAGLIFLOZIN 25 MG PO TABS: ORAL_TABLET | ORAL | 3 refills | Status: DC

## 2023-11-20 MED ORDER — BUSPIRONE HCL 5 MG PO TABS: 5.0000 mg | ORAL_TABLET | Freq: Two times a day (BID) | ORAL | 3 refills | Status: DC | PRN

## 2023-11-20 MED ORDER — GLIPIZIDE ER 10 MG PO TB24: 10.0000 mg | ORAL_TABLET | Freq: Every day | ORAL | 3 refills | Status: DC

## 2023-11-20 MED ORDER — EMPAGLIFLOZIN 10 MG PO TABS: 10.0000 mg | ORAL_TABLET | Freq: Every day | ORAL | 0 refills | Status: DC

## 2023-11-20 NOTE — Assessment & Plan Note (Signed)
Patient states taking medication but sharing with spouse so unclear what dosing or how often. She states a HgA1c was checked recently around 11. We do not have those records and will check HgA1c, microalbumin to creatinine ratio and CMP and lipid panel. No eye exam since childhood optho referral done. High concern for retinopathy as she has been uncontrolled for some time. Foot exam done. We will resume metformin 1000 mg BID, glipizide 10 mg daily, jardiance 10 mg daily and add ozempic 0.25 mg weekly for 1 month. Then return 1-2 months.

## 2023-11-20 NOTE — Assessment & Plan Note (Signed)
Has not had any follow up since our last visit 18 months ago but she states she has upcoming apt with [facility] in town.

## 2023-11-20 NOTE — Patient Instructions (Signed)
We have sent in the refills and will start ozempic.   We are checking the labs and urine today.

## 2023-11-20 NOTE — Assessment & Plan Note (Signed)
Taking sertraline although likely ran out of refills some time ago (may be taking spouse's). Refill zoloft 200 mg daily and add back wellbutrin 150 mg daily and buspar 5 mg BID prn. She is not well controlled.

## 2023-11-20 NOTE — Assessment & Plan Note (Signed)
Checking lipid panel and have refilled atorvastatin 20 mg daily.

## 2023-11-20 NOTE — Assessment & Plan Note (Signed)
Has high BP today and marked tachycardia (seen in ER for same and prescribed metoprolol). Did not follow up and ran out of this medication. Rx metoprolol 50 mg daily and follow up 1-2 months.

## 2023-11-20 NOTE — Assessment & Plan Note (Signed)
Still seeing pain management as we did dismiss from controlled substances here.

## 2023-11-20 NOTE — Assessment & Plan Note (Signed)
She has been off birth control would like to resume refilled today not pregnant per LMP.

## 2023-11-20 NOTE — Assessment & Plan Note (Signed)
Checking CMP.

## 2023-11-20 NOTE — Progress Notes (Signed)
   Subjective:   Patient ID: [name], female    DOB: [birth year], 37 y.o.   MRN: [id number]  Medication Refill Associated symptoms include arthralgias and fatigue. Pertinent negatives include no abdominal pain, chest pain, coughing, nausea or vomiting.   The patient is a 37 YO female coming in for follow up with being out of medications for some time. She admits to taking partner's medication and states she is not out for long. She had HgA1c at pain management which was around 11 and sugar in 500s. She has not been feeling different than usual.   Review of Systems  Constitutional:  Positive for activity change and fatigue.  HENT: Negative.    Eyes: Negative.   Respiratory:  Negative for cough, chest tightness and shortness of breath.   Cardiovascular:  Negative for chest pain, palpitations and leg swelling.  Gastrointestinal:  Negative for abdominal distention, abdominal pain, constipation, diarrhea, nausea and vomiting.  Musculoskeletal:  Positive for arthralgias.  Skin: Negative.   Neurological: Negative.   Psychiatric/Behavioral:  The patient is nervous/anxious.     Objective:  Physical Exam Constitutional:      Appearance: She is well-developed.  HENT:     Head: Normocephalic and atraumatic.  Cardiovascular:     Rate and Rhythm: Normal rate and regular rhythm.  Pulmonary:     Effort: Pulmonary effort is normal. No respiratory distress.     Breath sounds: Normal breath sounds. No wheezing or rales.     Comments: Oxygen 3L via nasal cannula Abdominal:     General: Bowel sounds are normal. There is no distension.     Palpations: Abdomen is soft.     Tenderness: There is no abdominal tenderness. There is no rebound.  Musculoskeletal:     Cervical back: Normal range of motion.  Skin:    General: Skin is warm and dry.     Comments: Foot exam done  Neurological:     Mental Status: She is alert and oriented to person, place, and time.     Coordination: Coordination  normal.     Vitals:   11/20/23 0940 11/20/23 0947  BP: (!) 160/80 (!) 160/80  Pulse: (!) 135   Temp: 97.9 F (36.6 C)   TempSrc: Oral   SpO2: 97%   Weight: 179 lb (81.2 kg)   Height: 4' 11.5" (1.511 m)     Assessment & Plan:  Visit time 25 minutes in face to face communication with patient and coordination of care, additional 15 minutes spent in record review, coordination or care, ordering tests, communicating/referring to other healthcare professionals, documenting in medical records all on the same day of the visit for total time 40 minutes spent on the visit.

## 2023-11-20 NOTE — Assessment & Plan Note (Signed)
Has not had appropriate follow up but states she has upcoming apt with [facility] cardiology soon.

## 2023-11-20 NOTE — Assessment & Plan Note (Signed)
Off meds for some time having symptoms. Will resume protonix 40 mg daily rx done.

## 2023-11-21 ENCOUNTER — Other Ambulatory Visit: Payer: Self-pay | Admitting: Internal Medicine

## 2023-11-21 MED ORDER — FLUCONAZOLE 150 MG PO TABS: 150.0000 mg | ORAL_TABLET | Freq: Once | ORAL | 0 refills | Status: AC

## 2023-12-10 DIAGNOSIS — R011 Cardiac murmur, unspecified: Secondary | ICD-10-CM | POA: Diagnosis not present

## 2023-12-10 DIAGNOSIS — J188 Other pneumonia, unspecified organism: Secondary | ICD-10-CM | POA: Diagnosis not present

## 2023-12-12 ENCOUNTER — Telehealth: Payer: Self-pay

## 2023-12-12 ENCOUNTER — Other Ambulatory Visit (HOSPITAL_COMMUNITY): Payer: Self-pay

## 2023-12-12 NOTE — Telephone Encounter (Signed)
Pharmacy Patient Advocate Encounter   Received notification from Patient Pharmacy that prior authorization for Semaglutide, 2 MG/DOSE, 8 MG/3ML SOPN is required/requested.   Insurance verification completed.   The patient is insured through [employer] .   Per test claim: The current 28 day co-pay is, $0.00.  No PA needed at this time. This test claim was processed through [facility]- copay amounts may vary at other pharmacies due to pharmacy/plan contracts, or as the patient moves through the different stages of their insurance plan.

## 2023-12-13 DIAGNOSIS — Z79899 Other long term (current) drug therapy: Secondary | ICD-10-CM | POA: Diagnosis not present

## 2023-12-18 ENCOUNTER — Other Ambulatory Visit: Payer: Self-pay | Admitting: Internal Medicine

## 2024-01-10 DIAGNOSIS — R011 Cardiac murmur, unspecified: Secondary | ICD-10-CM | POA: Diagnosis not present

## 2024-01-10 DIAGNOSIS — J188 Other pneumonia, unspecified organism: Secondary | ICD-10-CM | POA: Diagnosis not present

## 2024-01-14 DIAGNOSIS — Z79899 Other long term (current) drug therapy: Secondary | ICD-10-CM | POA: Diagnosis not present

## 2024-01-16 DIAGNOSIS — Z79899 Other long term (current) drug therapy: Secondary | ICD-10-CM | POA: Diagnosis not present

## 2024-01-23 ENCOUNTER — Other Ambulatory Visit: Payer: Self-pay | Admitting: Internal Medicine

## 2024-01-25 ENCOUNTER — Other Ambulatory Visit: Payer: Self-pay

## 2024-02-10 DIAGNOSIS — J188 Other pneumonia, unspecified organism: Secondary | ICD-10-CM | POA: Diagnosis not present

## 2024-02-10 DIAGNOSIS — R011 Cardiac murmur, unspecified: Secondary | ICD-10-CM | POA: Diagnosis not present

## 2024-02-13 DIAGNOSIS — Z79899 Other long term (current) drug therapy: Secondary | ICD-10-CM | POA: Diagnosis not present

## 2024-02-15 ENCOUNTER — Other Ambulatory Visit: Payer: Self-pay | Admitting: Internal Medicine

## 2024-02-16 DIAGNOSIS — Z79899 Other long term (current) drug therapy: Secondary | ICD-10-CM | POA: Diagnosis not present

## 2024-02-26 ENCOUNTER — Other Ambulatory Visit: Payer: Self-pay | Admitting: Internal Medicine

## 2024-02-28 ENCOUNTER — Other Ambulatory Visit: Payer: Self-pay | Admitting: Internal Medicine

## 2024-03-09 DIAGNOSIS — J188 Other pneumonia, unspecified organism: Secondary | ICD-10-CM | POA: Diagnosis not present

## 2024-03-09 DIAGNOSIS — R011 Cardiac murmur, unspecified: Secondary | ICD-10-CM | POA: Diagnosis not present

## 2024-03-11 DIAGNOSIS — Z79899 Other long term (current) drug therapy: Secondary | ICD-10-CM | POA: Diagnosis not present

## 2024-03-12 ENCOUNTER — Encounter: Payer: Self-pay | Admitting: Internal Medicine

## 2024-04-03 ENCOUNTER — Other Ambulatory Visit: Payer: Self-pay | Admitting: Internal Medicine

## 2024-04-09 DIAGNOSIS — J188 Other pneumonia, unspecified organism: Secondary | ICD-10-CM | POA: Diagnosis not present

## 2024-04-09 DIAGNOSIS — R011 Cardiac murmur, unspecified: Secondary | ICD-10-CM | POA: Diagnosis not present

## 2024-04-14 DIAGNOSIS — Z79899 Other long term (current) drug therapy: Secondary | ICD-10-CM | POA: Diagnosis not present

## 2024-04-14 DIAGNOSIS — Z6833 Body mass index (BMI) 33.0-33.9, adult: Secondary | ICD-10-CM | POA: Diagnosis not present

## 2024-04-14 DIAGNOSIS — E6609 Other obesity due to excess calories: Secondary | ICD-10-CM | POA: Diagnosis not present

## 2024-04-14 DIAGNOSIS — M41126 Adolescent idiopathic scoliosis, lumbar region: Secondary | ICD-10-CM | POA: Diagnosis not present

## 2024-04-14 DIAGNOSIS — E119 Type 2 diabetes mellitus without complications: Secondary | ICD-10-CM | POA: Diagnosis not present

## 2024-04-14 DIAGNOSIS — R29818 Other symptoms and signs involving the nervous system: Secondary | ICD-10-CM | POA: Diagnosis not present

## 2024-04-14 DIAGNOSIS — Z3200 Encounter for pregnancy test, result unknown: Secondary | ICD-10-CM | POA: Diagnosis not present

## 2024-04-21 DIAGNOSIS — Z79899 Other long term (current) drug therapy: Secondary | ICD-10-CM | POA: Diagnosis not present

## 2024-05-08 ENCOUNTER — Other Ambulatory Visit: Payer: Self-pay | Admitting: Internal Medicine

## 2024-05-08 NOTE — Telephone Encounter (Signed)
 Last Fill: 07/01/22  Last OV: 11/20/23 Next OV: None Scheduled  Routing to provider for review/authorization.

## 2024-05-08 NOTE — Telephone Encounter (Signed)
 Copied from CRM [id number]. Topic: Clinical - Medication Refill >> May 08, 2024  4:11 PM Baldo Levan wrote: Medication: albuterol  (VENTOLIN  HFA) 108 (90 Base) MCG/ACT inhaler  Has the patient contacted their pharmacy? Yes (Agent: If no, request that the patient contact the pharmacy for the refill. If patient does not wish to contact the pharmacy document the reason why and proceed with request.) (Agent: If yes, when and what did the pharmacy advise?)  This is the patient's preferred pharmacy:  [facility] [address] [place] - [address] [place] [address] Phone: [phone] Fax: [phone]  Is this the correct pharmacy for this prescription? Yes If no, delete pharmacy and type the correct one.   Has the prescription been filled recently? No  Is the patient out of the medication? Yes  Has the patient been seen for an appointment in the last year OR does the patient have an upcoming appointment? Yes  Can we respond through MyChart? Yes  Agent: Please be advised that Rx refills may take up to 3 business days. We ask that you follow-up with your pharmacy.

## 2024-05-09 DIAGNOSIS — R011 Cardiac murmur, unspecified: Secondary | ICD-10-CM | POA: Diagnosis not present

## 2024-05-09 DIAGNOSIS — J188 Other pneumonia, unspecified organism: Secondary | ICD-10-CM | POA: Diagnosis not present

## 2024-05-11 ENCOUNTER — Other Ambulatory Visit: Payer: Self-pay | Admitting: Internal Medicine

## 2024-05-12 MED ORDER — ALBUTEROL SULFATE HFA 108 (90 BASE) MCG/ACT IN AERS
1.0000 | INHALATION_SPRAY | RESPIRATORY_TRACT | 3 refills | Status: DC | PRN
Start: 2024-05-12 — End: 2024-07-16

## 2024-05-13 DIAGNOSIS — M41126 Adolescent idiopathic scoliosis, lumbar region: Secondary | ICD-10-CM | POA: Diagnosis not present

## 2024-05-13 DIAGNOSIS — Z3200 Encounter for pregnancy test, result unknown: Secondary | ICD-10-CM | POA: Diagnosis not present

## 2024-05-13 DIAGNOSIS — Z6833 Body mass index (BMI) 33.0-33.9, adult: Secondary | ICD-10-CM | POA: Diagnosis not present

## 2024-05-13 DIAGNOSIS — E6609 Other obesity due to excess calories: Secondary | ICD-10-CM | POA: Diagnosis not present

## 2024-05-13 DIAGNOSIS — Z79899 Other long term (current) drug therapy: Secondary | ICD-10-CM | POA: Diagnosis not present

## 2024-06-06 ENCOUNTER — Other Ambulatory Visit: Payer: Self-pay | Admitting: Internal Medicine

## 2024-06-09 DIAGNOSIS — J188 Other pneumonia, unspecified organism: Secondary | ICD-10-CM | POA: Diagnosis not present

## 2024-06-09 DIAGNOSIS — R011 Cardiac murmur, unspecified: Secondary | ICD-10-CM | POA: Diagnosis not present

## 2024-06-11 ENCOUNTER — Other Ambulatory Visit: Payer: Self-pay

## 2024-06-11 ENCOUNTER — Ambulatory Visit: Payer: Self-pay

## 2024-06-11 MED ORDER — METOPROLOL TARTRATE 25 MG PO TABS: 25.0000 mg | ORAL_TABLET | Freq: Every day | ORAL | 1 refills | Status: DC

## 2024-06-11 NOTE — Telephone Encounter (Signed)
 This was printed when tried to refill and have placed inside providers office

## 2024-06-11 NOTE — Telephone Encounter (Signed)
  FYI Only or Action Required?: FYI only for provider  Patient was last seen in primary care on 11/20/2023 by Adelia Homestead, MD. Called Nurse Triage reporting Medication Refill. Symptoms began n/a. Interventions attempted: Nothing. Symptoms are: stable.  Triage Disposition: Home Care  Patient/caregiver understands and will follow disposition?: Yes  Copied from CRM [id number]. Topic: Clinical - Prescription Issue >> Jun 11, 2024  2:26 PM Lovett Ruck C wrote: Reason for CRM: Received fax from pharmacy on 6/13 for refill request. Patient's fiance called for follow up to see if request was received on 6/16 and today. Refill request is still pending. Please advise Reason for Disposition  Caller with prescription and triager answers question  Answer Assessment - Initial Assessment Questions Refill request for metoprolol  submitted.  No other medication refills needed  Protocols used: Medication Refill and Renewal Call-A-AH

## 2024-06-13 DIAGNOSIS — E559 Vitamin D deficiency, unspecified: Secondary | ICD-10-CM | POA: Diagnosis not present

## 2024-06-13 DIAGNOSIS — E119 Type 2 diabetes mellitus without complications: Secondary | ICD-10-CM | POA: Diagnosis not present

## 2024-06-13 DIAGNOSIS — E6609 Other obesity due to excess calories: Secondary | ICD-10-CM | POA: Diagnosis not present

## 2024-06-13 DIAGNOSIS — Z79899 Other long term (current) drug therapy: Secondary | ICD-10-CM | POA: Diagnosis not present

## 2024-06-13 DIAGNOSIS — M41126 Adolescent idiopathic scoliosis, lumbar region: Secondary | ICD-10-CM | POA: Diagnosis not present

## 2024-06-13 DIAGNOSIS — Z3200 Encounter for pregnancy test, result unknown: Secondary | ICD-10-CM | POA: Diagnosis not present

## 2024-06-13 DIAGNOSIS — D539 Nutritional anemia, unspecified: Secondary | ICD-10-CM | POA: Diagnosis not present

## 2024-06-13 DIAGNOSIS — R5383 Other fatigue: Secondary | ICD-10-CM | POA: Diagnosis not present

## 2024-06-16 DIAGNOSIS — G4710 Hypersomnia, unspecified: Secondary | ICD-10-CM | POA: Diagnosis not present

## 2024-06-16 DIAGNOSIS — Z6833 Body mass index (BMI) 33.0-33.9, adult: Secondary | ICD-10-CM | POA: Diagnosis not present

## 2024-06-16 DIAGNOSIS — M41126 Adolescent idiopathic scoliosis, lumbar region: Secondary | ICD-10-CM | POA: Diagnosis not present

## 2024-06-16 DIAGNOSIS — I509 Heart failure, unspecified: Secondary | ICD-10-CM | POA: Diagnosis not present

## 2024-06-16 DIAGNOSIS — G894 Chronic pain syndrome: Secondary | ICD-10-CM | POA: Diagnosis not present

## 2024-06-16 DIAGNOSIS — E6609 Other obesity due to excess calories: Secondary | ICD-10-CM | POA: Diagnosis not present

## 2024-06-16 DIAGNOSIS — I272 Pulmonary hypertension, unspecified: Secondary | ICD-10-CM | POA: Diagnosis not present

## 2024-07-09 DIAGNOSIS — J188 Other pneumonia, unspecified organism: Secondary | ICD-10-CM | POA: Diagnosis not present

## 2024-07-09 DIAGNOSIS — R011 Cardiac murmur, unspecified: Secondary | ICD-10-CM | POA: Diagnosis not present

## 2024-07-11 DIAGNOSIS — Z79899 Other long term (current) drug therapy: Secondary | ICD-10-CM | POA: Diagnosis not present

## 2024-07-11 DIAGNOSIS — M41126 Adolescent idiopathic scoliosis, lumbar region: Secondary | ICD-10-CM | POA: Diagnosis not present

## 2024-07-15 ENCOUNTER — Other Ambulatory Visit: Payer: Self-pay | Admitting: Internal Medicine

## 2024-07-22 DIAGNOSIS — E7800 Pure hypercholesterolemia, unspecified: Secondary | ICD-10-CM | POA: Diagnosis not present

## 2024-07-22 DIAGNOSIS — Z3200 Encounter for pregnancy test, result unknown: Secondary | ICD-10-CM | POA: Diagnosis not present

## 2024-07-22 DIAGNOSIS — E119 Type 2 diabetes mellitus without complications: Secondary | ICD-10-CM | POA: Diagnosis not present

## 2024-07-22 DIAGNOSIS — Z79899 Other long term (current) drug therapy: Secondary | ICD-10-CM | POA: Diagnosis not present

## 2024-07-22 DIAGNOSIS — E6609 Other obesity due to excess calories: Secondary | ICD-10-CM | POA: Diagnosis not present

## 2024-07-22 DIAGNOSIS — Z9181 History of falling: Secondary | ICD-10-CM | POA: Diagnosis not present

## 2024-07-22 DIAGNOSIS — M41126 Adolescent idiopathic scoliosis, lumbar region: Secondary | ICD-10-CM | POA: Diagnosis not present

## 2024-07-31 ENCOUNTER — Other Ambulatory Visit: Payer: Self-pay | Admitting: Internal Medicine

## 2024-08-05 ENCOUNTER — Other Ambulatory Visit: Payer: Self-pay | Admitting: Internal Medicine

## 2024-08-09 DIAGNOSIS — R011 Cardiac murmur, unspecified: Secondary | ICD-10-CM | POA: Diagnosis not present

## 2024-08-09 DIAGNOSIS — J188 Other pneumonia, unspecified organism: Secondary | ICD-10-CM | POA: Diagnosis not present

## 2024-08-12 DIAGNOSIS — Z79899 Other long term (current) drug therapy: Secondary | ICD-10-CM | POA: Diagnosis not present

## 2024-08-16 ENCOUNTER — Other Ambulatory Visit: Payer: Self-pay | Admitting: Medical Genetics

## 2024-08-22 ENCOUNTER — Ambulatory Visit (INDEPENDENT_AMBULATORY_CARE_PROVIDER_SITE_OTHER): Admitting: Internal Medicine

## 2024-08-22 ENCOUNTER — Encounter: Payer: Self-pay | Admitting: Internal Medicine

## 2024-08-22 VITALS — BP 146/64 | HR 96 | Temp 98.4°F | Ht 59.5 in | Wt 181.0 lb

## 2024-08-22 DIAGNOSIS — E1169 Type 2 diabetes mellitus with other specified complication: Secondary | ICD-10-CM

## 2024-08-22 DIAGNOSIS — E118 Type 2 diabetes mellitus with unspecified complications: Secondary | ICD-10-CM

## 2024-08-22 DIAGNOSIS — I2729 Other secondary pulmonary hypertension: Secondary | ICD-10-CM | POA: Diagnosis not present

## 2024-08-22 DIAGNOSIS — Q2579 Other congenital malformations of pulmonary artery: Secondary | ICD-10-CM | POA: Diagnosis not present

## 2024-08-22 DIAGNOSIS — I371 Nonrheumatic pulmonary valve insufficiency: Secondary | ICD-10-CM | POA: Diagnosis not present

## 2024-08-22 DIAGNOSIS — F419 Anxiety disorder, unspecified: Secondary | ICD-10-CM | POA: Diagnosis not present

## 2024-08-22 DIAGNOSIS — F1120 Opioid dependence, uncomplicated: Secondary | ICD-10-CM

## 2024-08-22 DIAGNOSIS — M419 Scoliosis, unspecified: Secondary | ICD-10-CM | POA: Insufficient documentation

## 2024-08-22 DIAGNOSIS — J9611 Chronic respiratory failure with hypoxia: Secondary | ICD-10-CM

## 2024-08-22 DIAGNOSIS — Q249 Congenital malformation of heart, unspecified: Secondary | ICD-10-CM | POA: Diagnosis not present

## 2024-08-22 DIAGNOSIS — Z7984 Long term (current) use of oral hypoglycemic drugs: Secondary | ICD-10-CM | POA: Diagnosis not present

## 2024-08-22 DIAGNOSIS — E785 Hyperlipidemia, unspecified: Secondary | ICD-10-CM

## 2024-08-22 DIAGNOSIS — I50810 Right heart failure, unspecified: Secondary | ICD-10-CM | POA: Diagnosis not present

## 2024-08-22 DIAGNOSIS — Z8774 Personal history of (corrected) congenital malformations of heart and circulatory system: Secondary | ICD-10-CM

## 2024-08-22 LAB — POCT GLYCOSYLATED HEMOGLOBIN (HGB A1C): Hemoglobin A1C: 6.6 % — AB (ref 4.0–5.6)

## 2024-08-22 MED ORDER — BUSPIRONE HCL 10 MG PO TABS: 10.0000 mg | ORAL_TABLET | Freq: Two times a day (BID) | ORAL | 3 refills | Status: AC

## 2024-08-22 MED ORDER — PANTOPRAZOLE SODIUM 40 MG PO TBEC: 40.0000 mg | DELAYED_RELEASE_TABLET | Freq: Every day | ORAL | 3 refills | Status: AC

## 2024-08-22 MED ORDER — SEMAGLUTIDE (2 MG/DOSE) 8 MG/3ML ~~LOC~~ SOPN: 2.0000 mg | PEN_INJECTOR | SUBCUTANEOUS | 6 refills | Status: AC

## 2024-08-22 MED ORDER — METOPROLOL TARTRATE 25 MG PO TABS: 25.0000 mg | ORAL_TABLET | Freq: Every day | ORAL | 3 refills | Status: AC

## 2024-08-22 NOTE — Assessment & Plan Note (Signed)
 We decided not to do labs as they are not due today but she will continue lipitor 20 mg daily.

## 2024-08-22 NOTE — Assessment & Plan Note (Signed)
 Referral to [facility]. She likely is overdue for echo and right heart cath potentially.

## 2024-08-22 NOTE — Assessment & Plan Note (Signed)
 Continue zoloft  200 mg daily and wellbtrin 150 mg daily. Using buspar  for panic episodes but this is not effective. Change to buspar  10 mg BID prn for panic episodes. They are maybe 2-3 per month.

## 2024-08-22 NOTE — Assessment & Plan Note (Signed)
 Referral back to [facility].

## 2024-08-22 NOTE — Assessment & Plan Note (Signed)
 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

## 2024-08-22 NOTE — Assessment & Plan Note (Signed)
 BMI 35 and complicated by diabetes and hyperlipidemia. Counseled about diet and exercise to help. She is on metformin .

## 2024-08-22 NOTE — Assessment & Plan Note (Signed)
 POC hga1c done today and is 6.6. She is now controlled compared to prior 12.5. She will continue jardiance  25 mg daily, metformin  1000 mg bid and glipizide  10 mg daily.

## 2024-08-22 NOTE — Assessment & Plan Note (Signed)
 Has not seen cardiology in some time. We referred her locally in 2023 but they were unable to address and referred her back to [facility]. She does have transportation issues which make this difficult for her. Offered Child psychotherapist resources but she did not want today.

## 2024-08-22 NOTE — Assessment & Plan Note (Signed)
 She does continue to see pain management and they prescribe her substances. She denies side effects related to this.

## 2024-08-22 NOTE — Progress Notes (Signed)
 Subjective:   Patient ID: [name], female    DOB: Jan 02, 1986, 38 y.o.   MRN: [id number]  Discussed the use of AI scribe software for clinical note transcription with the patient, who gave verbal consent to proceed.  History of Present Illness [name] is a 38 year old female with anxiety and diabetes who presents with worsening panic attacks and breathing difficulties.  She experiences worsening panic attacks characterized by a sensation of being unable to breathe, occurring once or twice a month. Previously managed with Xanax , she is currently on Buspar , which she feels is ineffective at the current dose. She describes these as 'horrible panic attacks' and is uncertain about seeking emergency help during these episodes.  She has a history of breathing difficulties and congenital heart disease s/p repaire and uses oxygen  therapy, with a baseline of three liters at rest, increasing to six liters when active. No new chest pain is reported, but she experiences palpitations and skipped heartbeats, which are slightly more frequent than usual. She has not seen her congential cardiologist in some years due to transportation issues.  Her diabetes is managed with metformin , glipizide , and Jardiance , with no issues reported. Her recent A1c level is 6.6 today improved from a previous level of 12.5.  She faces transportation challenges that impact her ability to attend medical appointments.  Review of Systems  Constitutional: Negative.   HENT: Negative.    Eyes: Negative.   Respiratory:  Positive for shortness of breath. Negative for cough and chest tightness.   Cardiovascular:  Positive for palpitations. Negative for chest pain and leg swelling.  Gastrointestinal:  Negative for abdominal distention, abdominal pain, constipation, diarrhea, nausea and vomiting.  Musculoskeletal: Negative.   Skin: Negative.   Neurological: Negative.   Psychiatric/Behavioral:  The patient is  nervous/anxious.     Objective:  Physical Exam Constitutional:      Appearance: She is well-developed.  HENT:     Head: Normocephalic and atraumatic.  Cardiovascular:     Rate and Rhythm: Normal rate and regular rhythm.     Comments: On o2 via nasal cannula Pulmonary:     Effort: Pulmonary effort is normal. No respiratory distress.     Breath sounds: Normal breath sounds. No wheezing or rales.  Abdominal:     General: Bowel sounds are normal. There is no distension.     Palpations: Abdomen is soft.     Tenderness: There is no abdominal tenderness. There is no rebound.  Musculoskeletal:     Cervical back: Normal range of motion.  Skin:    General: Skin is warm and dry.  Neurological:     Mental Status: She is alert and oriented to person, place, and time.     Coordination: Coordination normal.     Vitals:   08/22/24 1137  BP: (!) 146/64  Pulse: 96  Temp: 98.4 F (36.9 C)  TempSrc: Oral  SpO2: 99%  Weight: 181 lb (82.1 kg)  Height: 4' 11.5 (1.511 m)    Assessment and Plan Assessment & Plan Type 2 diabetes mellitus without complications   Diabetes is well-controlled with an A1c of 6.6, improved from 12.5. She tolerates metformin , glipizide , and Jardiance  well, with no hypoglycemia reported.  Panic disorder with episodic panic attacks   She experiences panic attacks once or twice a month. Xanax  is effective but not used regularly. The current Buspar  dose is inadequate, so increase Buspar  to 10 mg for efficacy.  Palpitations   She has palpitations with  occasional skipped beats, possibly more frequent, but no new symptoms. Consider a heart monitor if palpitations increase.  Chronic hypoxemia requiring supplemental oxygen    Chronic hypoxemia is managed with 3 liters of oxygen  at rest and 6 liters with activity.

## 2024-08-22 NOTE — Patient Instructions (Signed)
 We will get you back in [facility] cardiology so let us  know if there is any problem with transportation we can probably help.

## 2024-08-27 ENCOUNTER — Telehealth: Payer: Self-pay | Admitting: Radiology

## 2024-08-27 NOTE — Telephone Encounter (Signed)
 Copied from CRM #[id number]. Topic: Clinical - Medication Question >> Aug 27, 2024 10:48 AM Precious C wrote: Reason for CRM: Patient called requesting continuation of her trazodone  prescription. She is requesting a follow-up via telephone at [phone].

## 2024-08-29 NOTE — Telephone Encounter (Signed)
 This is not on current med list. Would need dosing to answer/prescribe

## 2024-08-29 NOTE — Telephone Encounter (Signed)
 Called and left a very detailed message

## 2024-09-09 DIAGNOSIS — J188 Other pneumonia, unspecified organism: Secondary | ICD-10-CM | POA: Diagnosis not present

## 2024-09-09 DIAGNOSIS — R011 Cardiac murmur, unspecified: Secondary | ICD-10-CM | POA: Diagnosis not present

## 2024-09-22 DIAGNOSIS — Z79899 Other long term (current) drug therapy: Secondary | ICD-10-CM | POA: Diagnosis not present

## 2024-09-22 DIAGNOSIS — R5383 Other fatigue: Secondary | ICD-10-CM | POA: Diagnosis not present

## 2024-09-22 DIAGNOSIS — I509 Heart failure, unspecified: Secondary | ICD-10-CM | POA: Diagnosis not present

## 2024-09-22 DIAGNOSIS — M41126 Adolescent idiopathic scoliosis, lumbar region: Secondary | ICD-10-CM | POA: Diagnosis not present

## 2024-09-22 DIAGNOSIS — E559 Vitamin D deficiency, unspecified: Secondary | ICD-10-CM | POA: Diagnosis not present

## 2024-09-22 DIAGNOSIS — Z3200 Encounter for pregnancy test, result unknown: Secondary | ICD-10-CM | POA: Diagnosis not present

## 2024-09-22 DIAGNOSIS — M549 Dorsalgia, unspecified: Secondary | ICD-10-CM | POA: Diagnosis not present

## 2024-09-22 DIAGNOSIS — E7800 Pure hypercholesterolemia, unspecified: Secondary | ICD-10-CM | POA: Diagnosis not present

## 2024-09-22 DIAGNOSIS — Z9181 History of falling: Secondary | ICD-10-CM | POA: Diagnosis not present

## 2024-09-22 DIAGNOSIS — G8929 Other chronic pain: Secondary | ICD-10-CM | POA: Diagnosis not present

## 2024-09-22 DIAGNOSIS — E6609 Other obesity due to excess calories: Secondary | ICD-10-CM | POA: Diagnosis not present

## 2024-09-22 DIAGNOSIS — E119 Type 2 diabetes mellitus without complications: Secondary | ICD-10-CM | POA: Diagnosis not present

## 2024-10-04 ENCOUNTER — Other Ambulatory Visit: Payer: Self-pay | Admitting: Medical Genetics

## 2024-10-04 DIAGNOSIS — Z006 Encounter for examination for normal comparison and control in clinical research program: Secondary | ICD-10-CM

## 2024-10-09 DIAGNOSIS — J188 Other pneumonia, unspecified organism: Secondary | ICD-10-CM | POA: Diagnosis not present

## 2024-10-09 DIAGNOSIS — R0902 Hypoxemia: Secondary | ICD-10-CM | POA: Diagnosis not present

## 2024-10-09 DIAGNOSIS — G4733 Obstructive sleep apnea (adult) (pediatric): Secondary | ICD-10-CM | POA: Diagnosis not present

## 2024-10-09 DIAGNOSIS — R011 Cardiac murmur, unspecified: Secondary | ICD-10-CM | POA: Diagnosis not present

## 2024-10-09 DIAGNOSIS — G4736 Sleep related hypoventilation in conditions classified elsewhere: Secondary | ICD-10-CM | POA: Diagnosis not present

## 2024-10-10 ENCOUNTER — Encounter: Payer: Self-pay | Admitting: *Deleted

## 2024-10-10 NOTE — Progress Notes (Signed)
 MARGRETTE WYNIA                                          MRN: [id number]   10/10/2024   The [facility] Quality Team Specialist reviewed this patient medical record for the purposes of chart review for care gap closure. The following were reviewed: chart review for care gap closure-kidney health evaluation for diabetes:eGFR  and uACR.    [facility] Quality Team

## 2024-10-22 DIAGNOSIS — I272 Pulmonary hypertension, unspecified: Secondary | ICD-10-CM | POA: Diagnosis not present

## 2024-10-22 DIAGNOSIS — E6609 Other obesity due to excess calories: Secondary | ICD-10-CM | POA: Diagnosis not present

## 2024-10-22 DIAGNOSIS — I509 Heart failure, unspecified: Secondary | ICD-10-CM | POA: Diagnosis not present

## 2024-10-22 DIAGNOSIS — M41126 Adolescent idiopathic scoliosis, lumbar region: Secondary | ICD-10-CM | POA: Diagnosis not present

## 2024-10-22 DIAGNOSIS — Z1231 Encounter for screening mammogram for malignant neoplasm of breast: Secondary | ICD-10-CM | POA: Diagnosis not present

## 2024-10-22 DIAGNOSIS — D72829 Elevated white blood cell count, unspecified: Secondary | ICD-10-CM | POA: Diagnosis not present

## 2024-10-22 DIAGNOSIS — E7800 Pure hypercholesterolemia, unspecified: Secondary | ICD-10-CM | POA: Diagnosis not present

## 2024-10-22 DIAGNOSIS — Z3200 Encounter for pregnancy test, result unknown: Secondary | ICD-10-CM | POA: Diagnosis not present

## 2024-10-22 DIAGNOSIS — Z9181 History of falling: Secondary | ICD-10-CM | POA: Diagnosis not present

## 2024-10-22 DIAGNOSIS — Z79899 Other long term (current) drug therapy: Secondary | ICD-10-CM | POA: Diagnosis not present

## 2024-10-22 DIAGNOSIS — E119 Type 2 diabetes mellitus without complications: Secondary | ICD-10-CM | POA: Diagnosis not present

## 2024-10-23 DIAGNOSIS — G8929 Other chronic pain: Secondary | ICD-10-CM | POA: Diagnosis not present

## 2024-10-23 DIAGNOSIS — I509 Heart failure, unspecified: Secondary | ICD-10-CM | POA: Diagnosis not present

## 2024-10-23 DIAGNOSIS — I272 Pulmonary hypertension, unspecified: Secondary | ICD-10-CM | POA: Diagnosis not present

## 2024-10-23 DIAGNOSIS — Z6835 Body mass index (BMI) 35.0-35.9, adult: Secondary | ICD-10-CM | POA: Diagnosis not present

## 2024-10-23 DIAGNOSIS — E6609 Other obesity due to excess calories: Secondary | ICD-10-CM | POA: Diagnosis not present

## 2024-10-23 DIAGNOSIS — R0902 Hypoxemia: Secondary | ICD-10-CM | POA: Diagnosis not present

## 2024-10-23 DIAGNOSIS — Z9981 Dependence on supplemental oxygen: Secondary | ICD-10-CM | POA: Diagnosis not present

## 2024-10-23 DIAGNOSIS — M545 Low back pain, unspecified: Secondary | ICD-10-CM | POA: Diagnosis not present

## 2024-10-23 DIAGNOSIS — G4733 Obstructive sleep apnea (adult) (pediatric): Secondary | ICD-10-CM | POA: Diagnosis not present

## 2024-10-24 DIAGNOSIS — Z79899 Other long term (current) drug therapy: Secondary | ICD-10-CM | POA: Diagnosis not present

## 2024-10-31 ENCOUNTER — Telehealth: Payer: Self-pay

## 2024-10-31 NOTE — Telephone Encounter (Signed)
 Copied from CRM [id number]. Topic: Clinical - Medical Advice >> Oct 31, 2024 10:38 AM Viola F wrote: Reason for CRM: Tobias from [facility] had assessment today with the patient and wants to confirm that patient has no hyptertentsion, asthma, substance related disorder, etc Please call her to clarify at [phone]

## 2024-11-04 ENCOUNTER — Telehealth: Payer: Self-pay

## 2024-11-04 NOTE — Telephone Encounter (Signed)
 Copied from CRM #[id number]. Topic: General - Other >> Nov 04, 2024 11:17 AM Revonda D wrote: Reason for CRM: Tobias from [facility] is returning a missed call from [place]. I informed Tobias that she is currently not available and will return her call once available.

## 2024-11-04 NOTE — Telephone Encounter (Signed)
 LVM for [name] from [place] to call.

## 2024-11-06 NOTE — Telephone Encounter (Unsigned)
 Copied from CRM #[id number]. Topic: General - Other >> Nov 06, 2024 11:16 AM Roselie BROCKS wrote: Reason for CRM: carla from [facility] is returning a missed call from [place]. And requests a return call to her.  Return phone number [phone] >> Nov 06, 2024  1:47 PM Drema MATSU wrote: Tobias is returning a call from [place]. Please call Carla back.

## 2024-11-06 NOTE — Telephone Encounter (Unsigned)
 Copied from CRM #[id number]. Topic: General - Other >> Nov 06, 2024 11:16 AM Roselie BROCKS wrote: Reason for CRM: carla from [facility] is returning a missed call from [place]. And requests a return call to her.  Return phone number [phone]

## 2024-11-07 NOTE — Telephone Encounter (Signed)
 Please see previous note.

## 2024-11-07 NOTE — Telephone Encounter (Signed)
 Spoke with Carla from [place]. Questions related to pt were answered.

## 2024-11-09 DIAGNOSIS — J188 Other pneumonia, unspecified organism: Secondary | ICD-10-CM | POA: Diagnosis not present

## 2024-11-09 DIAGNOSIS — R011 Cardiac murmur, unspecified: Secondary | ICD-10-CM | POA: Diagnosis not present

## 2024-11-10 ENCOUNTER — Other Ambulatory Visit: Payer: Self-pay | Admitting: Internal Medicine

## 2024-11-16 DIAGNOSIS — D72829 Elevated white blood cell count, unspecified: Secondary | ICD-10-CM | POA: Diagnosis not present

## 2024-11-16 DIAGNOSIS — I509 Heart failure, unspecified: Secondary | ICD-10-CM | POA: Diagnosis not present

## 2024-11-16 DIAGNOSIS — E119 Type 2 diabetes mellitus without complications: Secondary | ICD-10-CM | POA: Diagnosis not present

## 2024-11-16 DIAGNOSIS — E6609 Other obesity due to excess calories: Secondary | ICD-10-CM | POA: Diagnosis not present

## 2024-11-16 DIAGNOSIS — Z1231 Encounter for screening mammogram for malignant neoplasm of breast: Secondary | ICD-10-CM | POA: Diagnosis not present

## 2024-11-16 DIAGNOSIS — M41126 Adolescent idiopathic scoliosis, lumbar region: Secondary | ICD-10-CM | POA: Diagnosis not present

## 2024-11-16 DIAGNOSIS — I272 Pulmonary hypertension, unspecified: Secondary | ICD-10-CM | POA: Diagnosis not present

## 2024-11-16 DIAGNOSIS — Z79899 Other long term (current) drug therapy: Secondary | ICD-10-CM | POA: Diagnosis not present

## 2024-11-16 DIAGNOSIS — E7800 Pure hypercholesterolemia, unspecified: Secondary | ICD-10-CM | POA: Diagnosis not present

## 2024-11-16 DIAGNOSIS — Z124 Encounter for screening for malignant neoplasm of cervix: Secondary | ICD-10-CM | POA: Diagnosis not present

## 2024-11-16 DIAGNOSIS — Z9181 History of falling: Secondary | ICD-10-CM | POA: Diagnosis not present

## 2024-11-16 DIAGNOSIS — Z3200 Encounter for pregnancy test, result unknown: Secondary | ICD-10-CM | POA: Diagnosis not present

## 2024-11-19 DIAGNOSIS — Z79899 Other long term (current) drug therapy: Secondary | ICD-10-CM | POA: Diagnosis not present

## 2024-11-21 ENCOUNTER — Other Ambulatory Visit: Payer: Self-pay | Admitting: Internal Medicine

## 2024-12-09 DIAGNOSIS — J188 Other pneumonia, unspecified organism: Secondary | ICD-10-CM | POA: Diagnosis not present

## 2024-12-09 DIAGNOSIS — R011 Cardiac murmur, unspecified: Secondary | ICD-10-CM | POA: Diagnosis not present

## 2024-12-13 ENCOUNTER — Other Ambulatory Visit: Payer: Self-pay | Admitting: Internal Medicine

## 2024-12-19 ENCOUNTER — Other Ambulatory Visit: Payer: Self-pay | Admitting: Internal Medicine

## 2024-12-26 NOTE — Progress Notes (Signed)
 [name]                                          MRN: [id number]   12/26/2024   The [facility] Quality Team Specialist reviewed this patient medical record for the purposes of chart review for care gap closure. The following were reviewed: chart review for care gap closure-kidney health evaluation for diabetes:eGFR  and uACR.    [facility]BCI Quality Team

## 2025-01-12 ENCOUNTER — Other Ambulatory Visit: Payer: Self-pay | Admitting: Internal Medicine

## 2025-01-20 NOTE — Progress Notes (Signed)
 [name]                                          MRN: [id number]   01/20/2025   The [facility] Quality Team Specialist reviewed this patient medical record for the purposes of chart review for care gap closure. The following were reviewed: chart review for care gap closure-kidney health evaluation for diabetes:eGFR  and uACR.    [facility] Quality Team

## 2025-01-22 ENCOUNTER — Other Ambulatory Visit: Payer: Self-pay | Admitting: Internal Medicine

## 2025-01-29 ENCOUNTER — Encounter: Payer: Self-pay | Admitting: *Deleted

## 2025-01-29 NOTE — Progress Notes (Signed)
 VERENIS NICOSIA                                          MRN: [id number]   01/29/2025   The [facility] Quality Team Specialist reviewed this patient medical record for the purposes of chart review for care gap closure. The following were reviewed: chart review for care gap closure-diabetic eye exam.    [facility]BCI Quality Team
